# Patient Record
Sex: Male | Born: 1935
Health system: Southern US, Community
[De-identification: ages and names within clinical notes are randomized; demographics above are authoritative.]

## PROBLEM LIST (undated history)

## (undated) DIAGNOSIS — K5900 Constipation, unspecified: Secondary | ICD-10-CM

## (undated) DIAGNOSIS — G250 Essential tremor: Secondary | ICD-10-CM

## (undated) DIAGNOSIS — H906 Mixed conductive and sensorineural hearing loss, bilateral: Secondary | ICD-10-CM

## (undated) DIAGNOSIS — J449 Chronic obstructive pulmonary disease, unspecified: Secondary | ICD-10-CM

## (undated) DIAGNOSIS — E538 Deficiency of other specified B group vitamins: Secondary | ICD-10-CM

## (undated) DIAGNOSIS — M1990 Unspecified osteoarthritis, unspecified site: Secondary | ICD-10-CM

## (undated) DIAGNOSIS — E785 Hyperlipidemia, unspecified: Secondary | ICD-10-CM

## (undated) DIAGNOSIS — G309 Alzheimer's disease, unspecified: Secondary | ICD-10-CM

## (undated) DIAGNOSIS — N183 Chronic kidney disease, stage 3 unspecified: Secondary | ICD-10-CM

## (undated) DIAGNOSIS — F0280 Dementia in other diseases classified elsewhere without behavioral disturbance: Secondary | ICD-10-CM

## (undated) DIAGNOSIS — E782 Mixed hyperlipidemia: Secondary | ICD-10-CM

## (undated) DIAGNOSIS — I2510 Atherosclerotic heart disease of native coronary artery without angina pectoris: Secondary | ICD-10-CM

## (undated) DIAGNOSIS — IMO0002 Reserved for concepts with insufficient information to code with codable children: Secondary | ICD-10-CM

## (undated) DIAGNOSIS — M1710 Unilateral primary osteoarthritis, unspecified knee: Secondary | ICD-10-CM

## (undated) DIAGNOSIS — N182 Chronic kidney disease, stage 2 (mild): Secondary | ICD-10-CM

## (undated) DIAGNOSIS — I10 Essential (primary) hypertension: Secondary | ICD-10-CM

## (undated) HISTORY — DX: Atherosclerotic heart disease of native coronary artery without angina pectoris: I25.10

## (undated) HISTORY — DX: Dementia in other diseases classified elsewhere, unspecified severity, without behavioral disturbance, psychotic disturbance, mood disturbance, and anxiety: F02.80

## (undated) HISTORY — DX: Mixed hyperlipidemia: E78.2

## (undated) HISTORY — DX: Hyperlipidemia, unspecified: E78.5

## (undated) HISTORY — DX: Unspecified osteoarthritis, unspecified site: M19.90

## (undated) HISTORY — DX: Deficiency of other specified B group vitamins: E53.8

## (undated) HISTORY — DX: Unilateral primary osteoarthritis, unspecified knee: M17.10

## (undated) HISTORY — DX: Chronic kidney disease, stage 2 (mild): N18.2

## (undated) HISTORY — PX: CATARACT EXTRACTION, BILATERAL: SHX1313

## (undated) HISTORY — DX: Mixed conductive and sensorineural hearing loss, bilateral: H90.6

## (undated) HISTORY — DX: Chronic kidney disease, stage 3 (moderate): N18.3

## (undated) HISTORY — DX: Reserved for concepts with insufficient information to code with codable children: IMO0002

## (undated) HISTORY — DX: Alzheimer's disease, unspecified: G30.9

## (undated) HISTORY — DX: Chronic kidney disease, stage 3 unspecified: N18.30

## (undated) HISTORY — DX: Essential (primary) hypertension: I10

## (undated) HISTORY — DX: Essential tremor: G25.0

## (undated) HISTORY — DX: Constipation, unspecified: K59.00

## (undated) HISTORY — DX: Chronic obstructive pulmonary disease, unspecified: J44.9

---

## 1997-12-17 ENCOUNTER — Other Ambulatory Visit: Admission: RE | Admit: 1997-12-17 | Discharge: 1997-12-17 | Payer: Self-pay | Admitting: Family Medicine

## 2002-03-20 LAB — HM COLONOSCOPY

## 2005-11-01 ENCOUNTER — Ambulatory Visit: Payer: Self-pay | Admitting: Ophthalmology

## 2005-11-08 ENCOUNTER — Ambulatory Visit: Payer: Self-pay | Admitting: Ophthalmology

## 2010-02-04 ENCOUNTER — Emergency Department: Payer: Self-pay | Admitting: Unknown Physician Specialty

## 2011-04-25 IMAGING — CT CT ABD-PELV W/O CM
1 of 2 series · 15 of 32 positions shown, 19 images · non-contrast
Comparison: none

REASON FOR EXAM: (1) right abd flank pain; (2) right abd fklank pain
COMMENTS:   May transport without cardiac monitor

PROCEDURE:     CT  - CT ABDOMEN AND PELVIS W[DATE]  [DATE]
RESULT:     CT abdomen and pelvis dated 02/04/2010.
TECHNIQUE: Helical 3 mm sections were obtained from the lung bases through
the pubic symphysis without administration of intravenous contrast.

[Series 2: stone · axial · 0.66mm/px · z∈[-662,-280]mm · 15 of 144 slices shown, 19 images]
[im 11/144  soft-tissue]
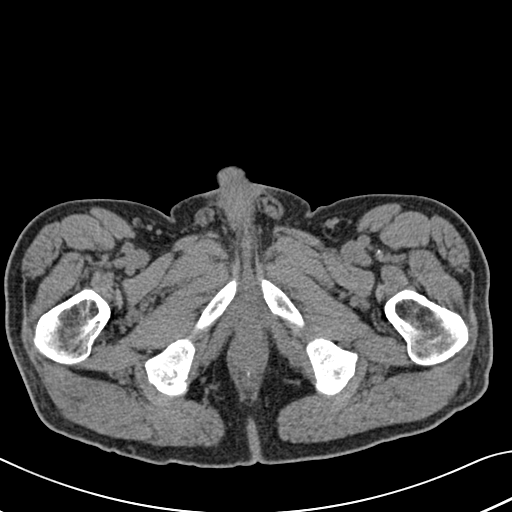
[im 11/144  bone]
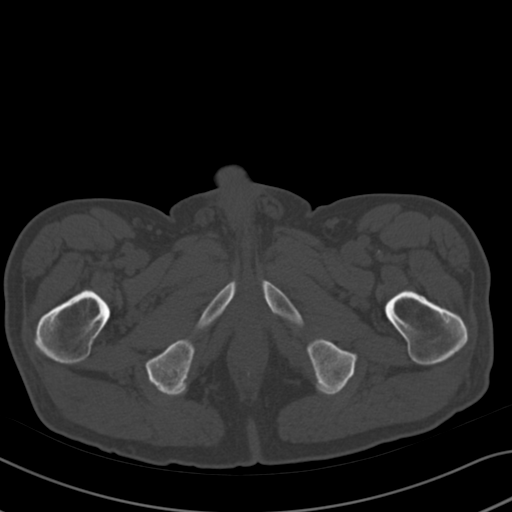
[im 22/144  soft-tissue]
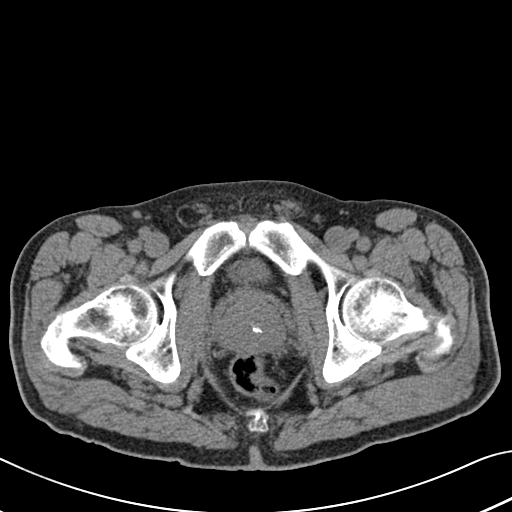
[im 32/144  soft-tissue]
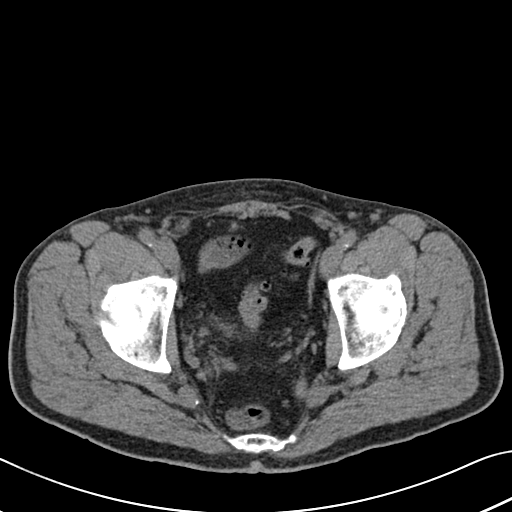
[im 43/144  soft-tissue]
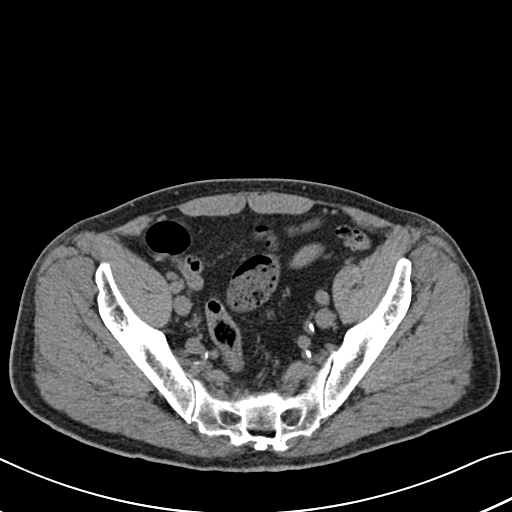
[im 53/144  soft-tissue]
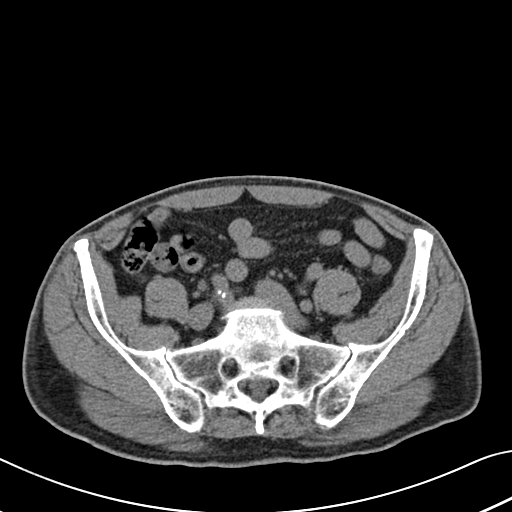
[im 64/144  soft-tissue]
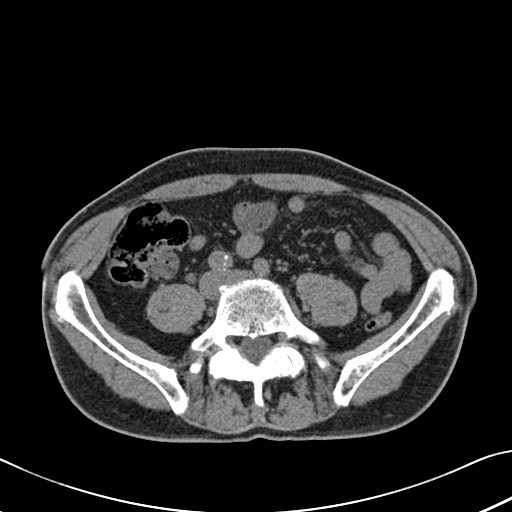
[im 75/144  soft-tissue]
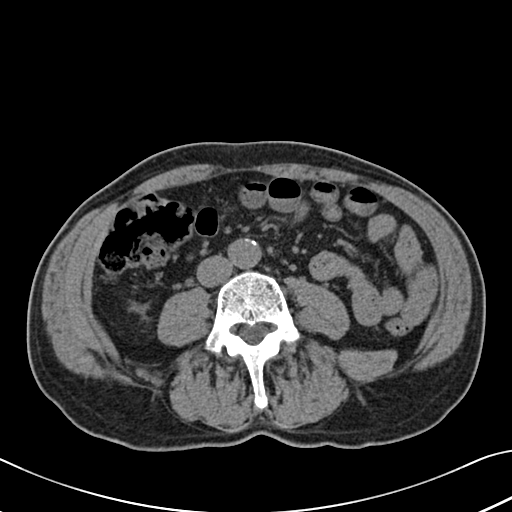
[im 85/144  soft-tissue]
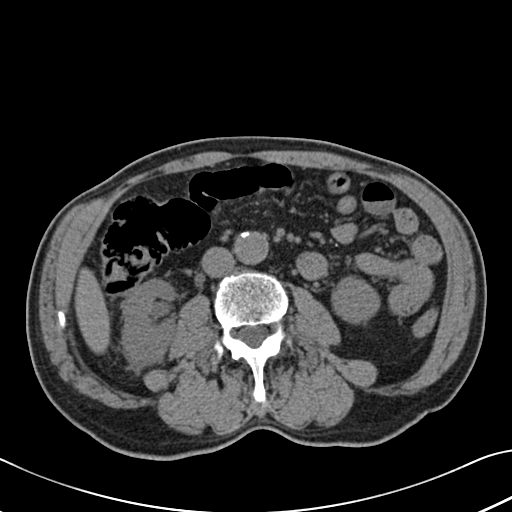
[im 96/144  soft-tissue]
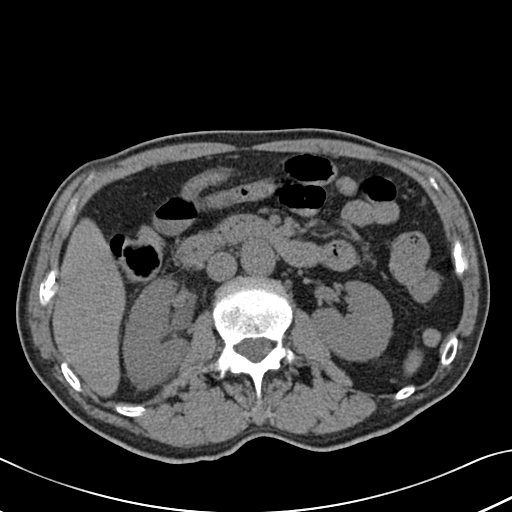
[im 96/144  bone]
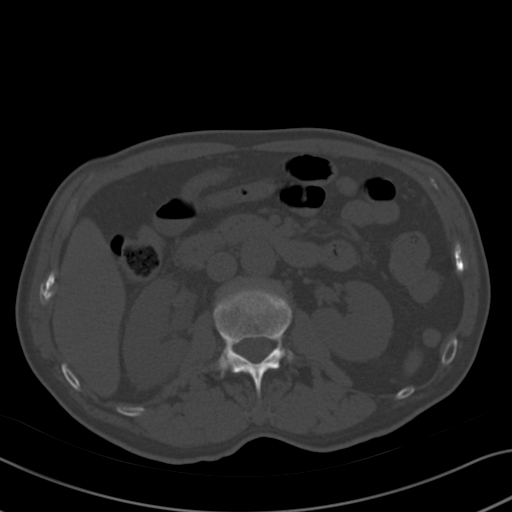
[im 106/144  soft-tissue]
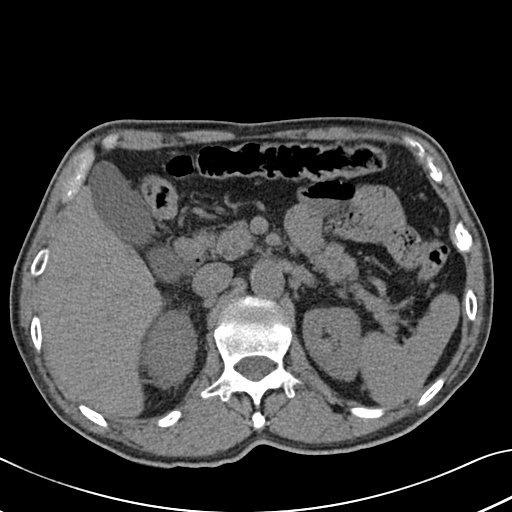
[im 117/144  soft-tissue]
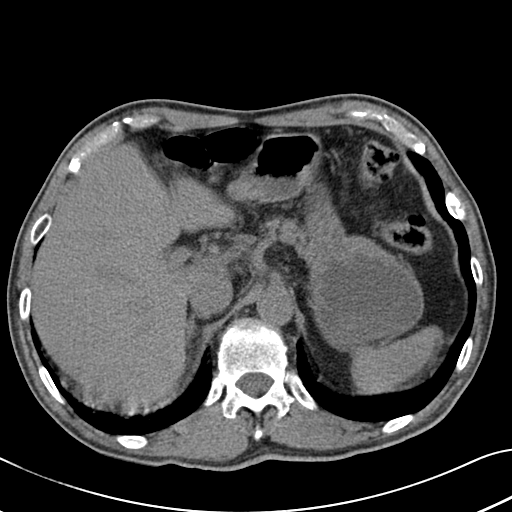
[im 122/144  lung]
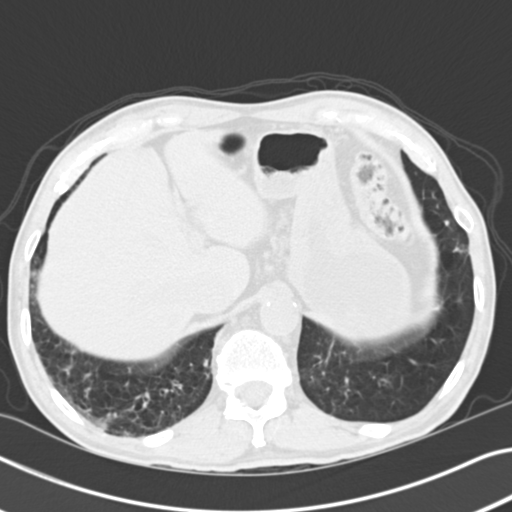
[im 128/144  soft-tissue]
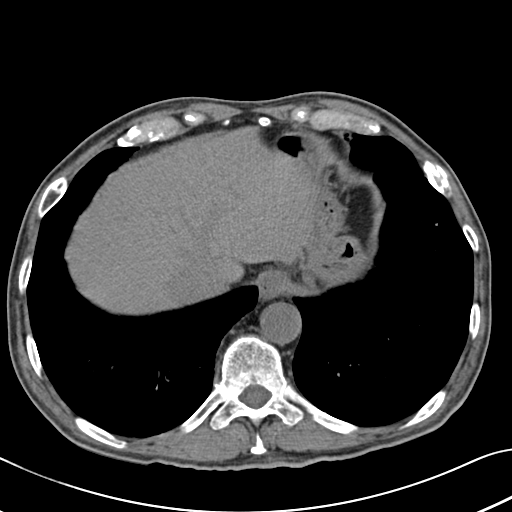
[im 128/144  lung]
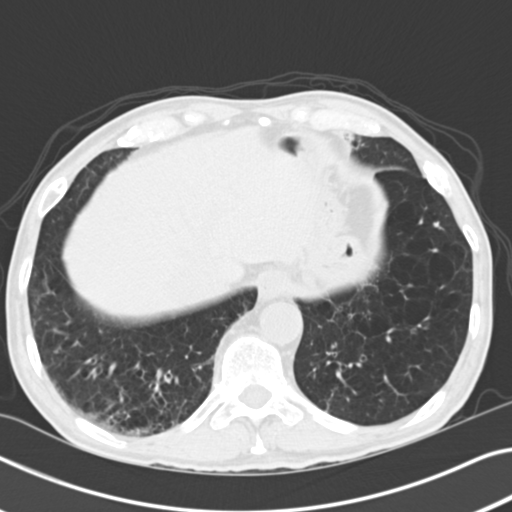
[im 133/144  lung]
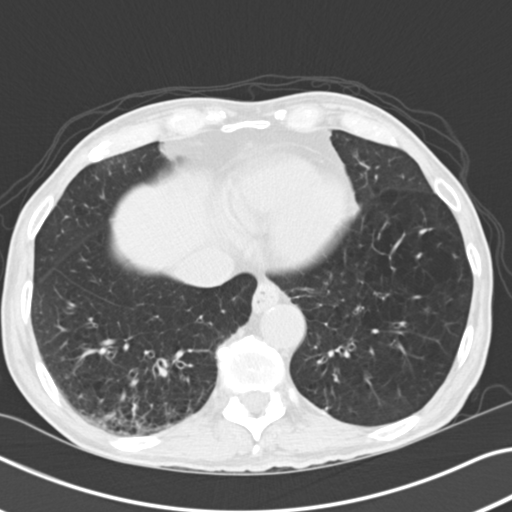
[im 138/144  soft-tissue]
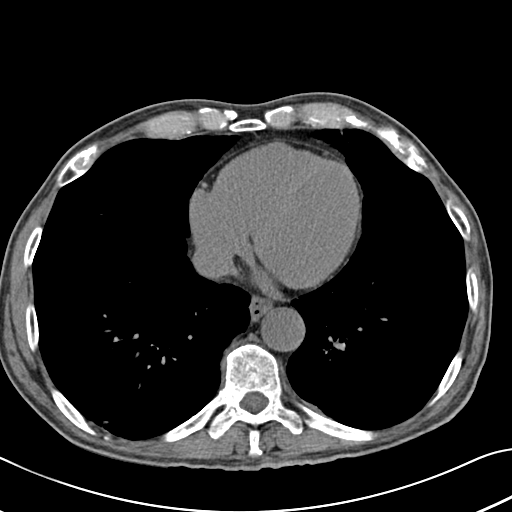
[im 138/144  lung]
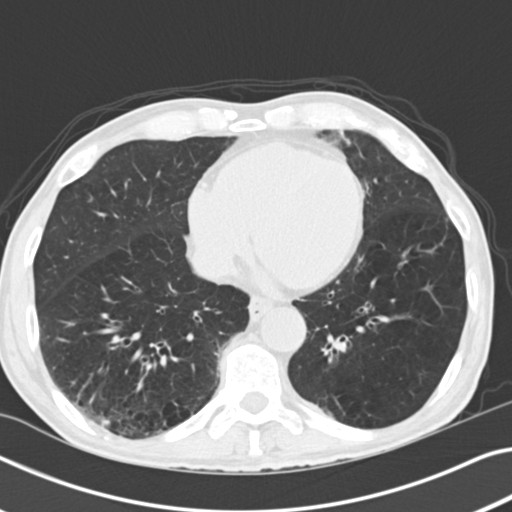

[15 of 32 positions shown; findings below may reference images not displayed]

FINDINGS: The lung bases demonstrates prominent interstitial markings like
representing fibrotic changes. There is likely hypoventilation within the
right lung base.

Evaluation of the right kidney demonstrates mild hydronephrosis as well as
mild pelviectasis. Is mild to moderate hydroureter. Within the distal ureter
at the level of the proximal ureteral vesicle junction a 2.6 mm calculus is
identified. There is no CT evidence of bowel obstruction no secondary signs
reflecting enteritis, colitis, diverticulitis, nor appendicitis within the
limitations of a noncontrasted CT. The aorta is ectatic without a definite
focal aneurysmal dilatation.

Noncontrast evaluation of the liver, spleen, adrenals, pancreas, left kidney
is unremarkable.
IMPRESSION: 1. Distal right ureteral calculus projecting at the level a proximal
ureteral vesicle junction with associated mild obstructive uropathy.
2. Fibrotic changes within the lung bases as well as likely hypoventilation
in the right lung base.

## 2011-04-25 IMAGING — CR DG CHEST 1V PORT
1 series · 1 of 1 positions shown · non-contrast
Comparison: none

REASON FOR EXAM: abd pain
COMMENTS:

PROCEDURE:     DXR - DXR PORTABLE CHEST SINGLE VIEW  - February 04, 2010  [DATE]
RESULT:     The lungs are clear. The cardiac silhouette and visualized bony
skeleton are unremarkable.

[view not recorded]
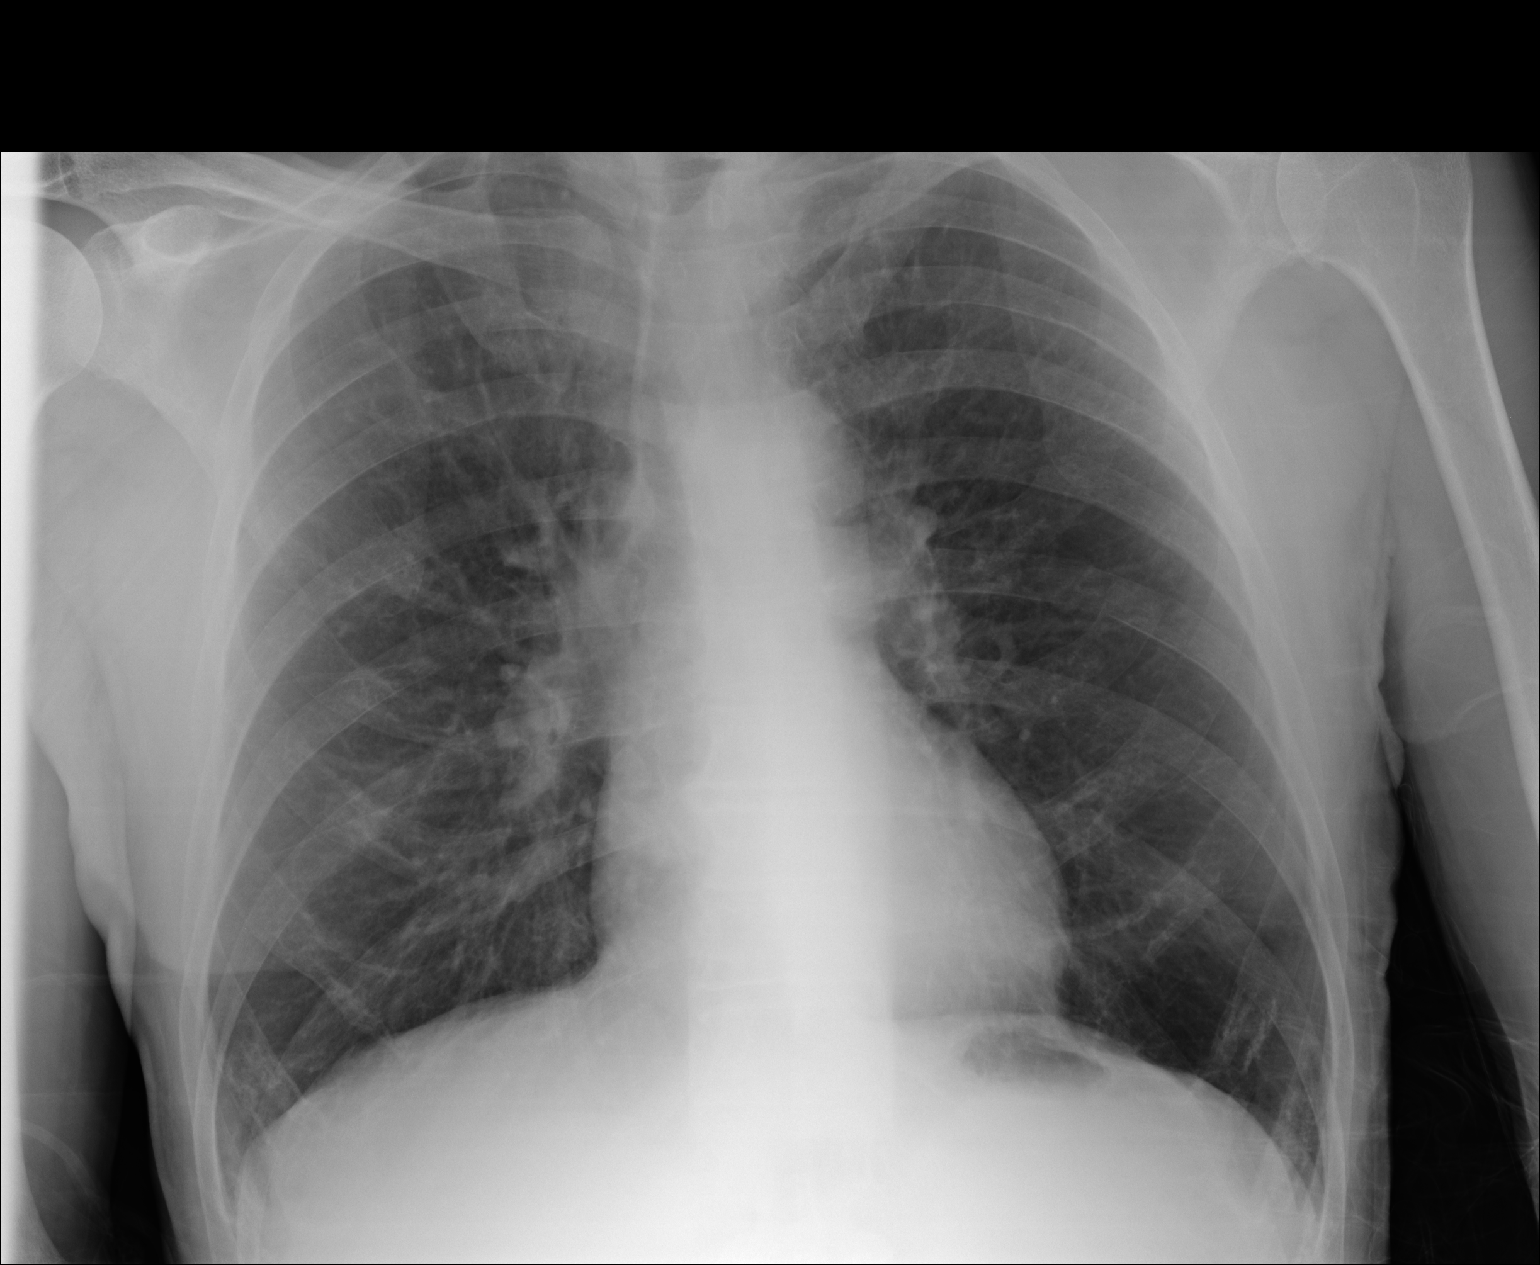

[1 of 1 positions shown; findings below may reference images not displayed]

IMPRESSION: 1. Chest radiograph without evidence of acute cardiopulmonary disease.

## 2013-10-18 ENCOUNTER — Encounter: Payer: Self-pay | Admitting: Cardiology

## 2013-10-18 ENCOUNTER — Ambulatory Visit (INDEPENDENT_AMBULATORY_CARE_PROVIDER_SITE_OTHER): Payer: Medicare Other | Admitting: Cardiology

## 2013-10-18 VITALS — BP 145/75 | HR 70 | Ht 65.0 in | Wt 136.6 lb

## 2013-10-18 DIAGNOSIS — N183 Chronic kidney disease, stage 3 unspecified: Secondary | ICD-10-CM

## 2013-10-18 DIAGNOSIS — I2510 Atherosclerotic heart disease of native coronary artery without angina pectoris: Secondary | ICD-10-CM | POA: Insufficient documentation

## 2013-10-18 DIAGNOSIS — E782 Mixed hyperlipidemia: Secondary | ICD-10-CM | POA: Insufficient documentation

## 2013-10-18 DIAGNOSIS — E785 Hyperlipidemia, unspecified: Secondary | ICD-10-CM

## 2013-10-18 DIAGNOSIS — I10 Essential (primary) hypertension: Secondary | ICD-10-CM

## 2013-10-18 NOTE — Patient Instructions (Signed)
Continue your current therapy  I will see you as needed. 

## 2013-10-18 NOTE — Progress Notes (Signed)
   Lucas Kline Date of Birth: 05-11-1929 Medical Record #[id number]  History of Present Illness: Lucas Kline is seen at the request of Dr. Henrene Pastor for evaluation of chest pain. He is a pleasant 80 yo WM with history of CAD. He apparently had cardiac caths in 1987 and 1997. He was treated medically and told there was nothing else needed to be done. He is still very active walking his dog, using a chain saw and yard work. Last week he had one brief episode of chest pain at rest. He got up and belched and his pain went away. He denies any other chest pain, SOB, palpitations, or dizziness. He is on chronic verapamil, statin, and ASA. No history of CHF.  No current outpatient prescriptions on file prior to visit.   No current facility-administered medications on file prior to visit.    Not on File  Past Medical History  Diagnosis Date  . Alzheimer disease   . CKD (chronic kidney disease), stage II     MILD  . CKD (chronic kidney disease), stage III     MODERATE (CHRONIC)  . Coronary atherosclerosis     CHRONIC  . Essential tremor     CHRONIC  . Essential hypertension, benign   . Mixed hearing loss, bilateral     CHRONIC  . Osteoarthritis     OF KNEE CHRONIC  . Other and unspecified hyperlipidemia   . Hyperlipidemia     CHRONIC  . B12 deficiency     CHRONIC  . Unspecified arthropathy, lower leg   . Unspecified constipation     CHRONIC  . COPD (chronic obstructive pulmonary disease)     TYPE 2    Past Surgical History  Procedure Laterality Date  . Cataract extraction, bilateral      History  Smoking status  . Former Smoker  Smokeless tobacco  . Not on file    History  Alcohol Use No    History reviewed. No pertinent family history.  Review of Systems: As noted in HPI.  All other systems were reviewed and are negative.  Physical Exam: BP 145/75  Pulse 70  Ht 5\' 5"  (1.651 m)  Wt 136 lb 9.6 oz (61.961 kg)  BMI 22.73 kg/m2 He is a pleasant, thin,  elderly WM in NAD.  HEENT: [place]/AT, PERRL, EOMI, sclera are clear. orophyarynx is clear.  Neck: no adenopathy, JVD, bruits, or thyromegaly. Lungs: clear. CV: RRR with occ extrasystoles. No murmur or gallop. Normal S1-2. Abdomen: soft, NT, no masses, HSM, or bruits. Ext: no edema. Pulses 2+ Skin: warm and dry Neuro: alert O x 3. CN II-XII intact. Some short term memory loss.  LABORATORY DATA: Ecg: NSR with frequents PVCs. Otherwise normal.  Assessment / Plan: 1. Atypical chest pain. Patient apparently has a history of CAD by prior cardiac cath although old records are not available. His symptoms do not suggest increased angina. He is on appropriate medical therapy. I would not recommend further cardiac evaluation unless he clearly has increasing chest pain.  2. PVCs. Asymptomatic. Continue verapamil.  3. CKD stage 2-3.  4. HTN controlled  5. Hyperlipidemia on statin  6. Alzheimer's disease.

## 2014-10-07 DIAGNOSIS — G309 Alzheimer's disease, unspecified: Secondary | ICD-10-CM | POA: Diagnosis not present

## 2014-10-07 DIAGNOSIS — Z6820 Body mass index (BMI) 20.0-20.9, adult: Secondary | ICD-10-CM | POA: Diagnosis not present

## 2014-10-07 DIAGNOSIS — N183 Chronic kidney disease, stage 3 (moderate): Secondary | ICD-10-CM | POA: Diagnosis not present

## 2014-10-07 DIAGNOSIS — I2510 Atherosclerotic heart disease of native coronary artery without angina pectoris: Secondary | ICD-10-CM | POA: Diagnosis not present

## 2014-10-07 DIAGNOSIS — E785 Hyperlipidemia, unspecified: Secondary | ICD-10-CM | POA: Diagnosis not present

## 2014-11-26 DIAGNOSIS — R197 Diarrhea, unspecified: Secondary | ICD-10-CM | POA: Diagnosis not present

## 2014-11-26 DIAGNOSIS — E860 Dehydration: Secondary | ICD-10-CM | POA: Diagnosis not present

## 2015-01-30 DIAGNOSIS — N401 Enlarged prostate with lower urinary tract symptoms: Secondary | ICD-10-CM | POA: Diagnosis not present

## 2015-01-30 DIAGNOSIS — I2510 Atherosclerotic heart disease of native coronary artery without angina pectoris: Secondary | ICD-10-CM | POA: Diagnosis not present

## 2015-01-30 DIAGNOSIS — C760 Malignant neoplasm of head, face and neck: Secondary | ICD-10-CM | POA: Diagnosis not present

## 2015-01-30 DIAGNOSIS — G309 Alzheimer's disease, unspecified: Secondary | ICD-10-CM | POA: Diagnosis not present

## 2015-01-30 DIAGNOSIS — E785 Hyperlipidemia, unspecified: Secondary | ICD-10-CM | POA: Diagnosis not present

## 2015-01-30 DIAGNOSIS — R55 Syncope and collapse: Secondary | ICD-10-CM | POA: Diagnosis not present

## 2015-06-13 DIAGNOSIS — Z0000 Encounter for general adult medical examination without abnormal findings: Secondary | ICD-10-CM | POA: Diagnosis not present

## 2015-06-13 DIAGNOSIS — N401 Enlarged prostate with lower urinary tract symptoms: Secondary | ICD-10-CM | POA: Diagnosis not present

## 2015-06-13 DIAGNOSIS — C760 Malignant neoplasm of head, face and neck: Secondary | ICD-10-CM | POA: Diagnosis not present

## 2015-06-13 DIAGNOSIS — I10 Essential (primary) hypertension: Secondary | ICD-10-CM | POA: Diagnosis not present

## 2015-06-13 DIAGNOSIS — N400 Enlarged prostate without lower urinary tract symptoms: Secondary | ICD-10-CM | POA: Diagnosis not present

## 2015-06-13 DIAGNOSIS — G250 Essential tremor: Secondary | ICD-10-CM | POA: Diagnosis not present

## 2015-06-13 DIAGNOSIS — E785 Hyperlipidemia, unspecified: Secondary | ICD-10-CM | POA: Diagnosis not present

## 2015-06-13 DIAGNOSIS — N183 Chronic kidney disease, stage 3 (moderate): Secondary | ICD-10-CM | POA: Diagnosis not present

## 2015-06-13 DIAGNOSIS — Z6820 Body mass index (BMI) 20.0-20.9, adult: Secondary | ICD-10-CM | POA: Diagnosis not present

## 2015-06-19 DIAGNOSIS — Z1211 Encounter for screening for malignant neoplasm of colon: Secondary | ICD-10-CM | POA: Diagnosis not present

## 2015-07-12 DIAGNOSIS — R0989 Other specified symptoms and signs involving the circulatory and respiratory systems: Secondary | ICD-10-CM | POA: Diagnosis not present

## 2015-07-12 DIAGNOSIS — R42 Dizziness and giddiness: Secondary | ICD-10-CM | POA: Diagnosis not present

## 2015-07-12 DIAGNOSIS — J159 Unspecified bacterial pneumonia: Secondary | ICD-10-CM | POA: Diagnosis not present

## 2015-07-12 DIAGNOSIS — E7800 Pure hypercholesterolemia, unspecified: Secondary | ICD-10-CM | POA: Diagnosis not present

## 2015-07-12 DIAGNOSIS — R05 Cough: Secondary | ICD-10-CM | POA: Diagnosis not present

## 2015-07-12 DIAGNOSIS — Z79899 Other long term (current) drug therapy: Secondary | ICD-10-CM | POA: Diagnosis not present

## 2015-07-12 DIAGNOSIS — R630 Anorexia: Secondary | ICD-10-CM | POA: Diagnosis not present

## 2015-07-12 DIAGNOSIS — R509 Fever, unspecified: Secondary | ICD-10-CM | POA: Diagnosis not present

## 2015-07-12 DIAGNOSIS — R531 Weakness: Secondary | ICD-10-CM | POA: Diagnosis not present

## 2015-07-12 DIAGNOSIS — Z681 Body mass index (BMI) 19 or less, adult: Secondary | ICD-10-CM | POA: Diagnosis not present

## 2015-07-12 DIAGNOSIS — I10 Essential (primary) hypertension: Secondary | ICD-10-CM | POA: Diagnosis not present

## 2015-07-21 DIAGNOSIS — Z681 Body mass index (BMI) 19 or less, adult: Secondary | ICD-10-CM | POA: Diagnosis not present

## 2015-07-21 DIAGNOSIS — J180 Bronchopneumonia, unspecified organism: Secondary | ICD-10-CM | POA: Diagnosis not present

## 2015-08-04 DIAGNOSIS — H906 Mixed conductive and sensorineural hearing loss, bilateral: Secondary | ICD-10-CM | POA: Diagnosis not present

## 2015-08-04 DIAGNOSIS — E538 Deficiency of other specified B group vitamins: Secondary | ICD-10-CM | POA: Diagnosis not present

## 2015-08-04 DIAGNOSIS — Z681 Body mass index (BMI) 19 or less, adult: Secondary | ICD-10-CM | POA: Diagnosis not present

## 2015-08-04 DIAGNOSIS — J180 Bronchopneumonia, unspecified organism: Secondary | ICD-10-CM | POA: Diagnosis not present

## 2015-08-04 DIAGNOSIS — Z0000 Encounter for general adult medical examination without abnormal findings: Secondary | ICD-10-CM | POA: Diagnosis not present

## 2015-08-04 DIAGNOSIS — N401 Enlarged prostate with lower urinary tract symptoms: Secondary | ICD-10-CM | POA: Diagnosis not present

## 2015-10-23 DIAGNOSIS — E538 Deficiency of other specified B group vitamins: Secondary | ICD-10-CM | POA: Diagnosis not present

## 2015-10-23 DIAGNOSIS — I10 Essential (primary) hypertension: Secondary | ICD-10-CM | POA: Diagnosis not present

## 2015-10-23 DIAGNOSIS — C760 Malignant neoplasm of head, face and neck: Secondary | ICD-10-CM | POA: Diagnosis not present

## 2015-10-23 DIAGNOSIS — E785 Hyperlipidemia, unspecified: Secondary | ICD-10-CM | POA: Diagnosis not present

## 2015-10-23 DIAGNOSIS — G309 Alzheimer's disease, unspecified: Secondary | ICD-10-CM | POA: Diagnosis not present

## 2015-11-03 DIAGNOSIS — C44311 Basal cell carcinoma of skin of nose: Secondary | ICD-10-CM | POA: Diagnosis not present

## 2015-11-19 DIAGNOSIS — C44311 Basal cell carcinoma of skin of nose: Secondary | ICD-10-CM | POA: Diagnosis not present

## 2016-01-23 DIAGNOSIS — R5383 Other fatigue: Secondary | ICD-10-CM | POA: Diagnosis not present

## 2016-01-23 DIAGNOSIS — G459 Transient cerebral ischemic attack, unspecified: Secondary | ICD-10-CM | POA: Diagnosis not present

## 2016-02-06 DIAGNOSIS — G459 Transient cerebral ischemic attack, unspecified: Secondary | ICD-10-CM | POA: Diagnosis not present

## 2016-02-06 DIAGNOSIS — H906 Mixed conductive and sensorineural hearing loss, bilateral: Secondary | ICD-10-CM | POA: Diagnosis not present

## 2016-02-06 DIAGNOSIS — C760 Malignant neoplasm of head, face and neck: Secondary | ICD-10-CM | POA: Diagnosis not present

## 2016-02-06 DIAGNOSIS — I10 Essential (primary) hypertension: Secondary | ICD-10-CM | POA: Diagnosis not present

## 2016-02-06 DIAGNOSIS — E785 Hyperlipidemia, unspecified: Secondary | ICD-10-CM | POA: Diagnosis not present

## 2016-02-26 DIAGNOSIS — R42 Dizziness and giddiness: Secondary | ICD-10-CM | POA: Diagnosis not present

## 2016-02-26 DIAGNOSIS — E785 Hyperlipidemia, unspecified: Secondary | ICD-10-CM | POA: Diagnosis not present

## 2016-02-26 DIAGNOSIS — I10 Essential (primary) hypertension: Secondary | ICD-10-CM | POA: Diagnosis not present

## 2016-03-25 DIAGNOSIS — E538 Deficiency of other specified B group vitamins: Secondary | ICD-10-CM | POA: Diagnosis not present

## 2016-04-27 DIAGNOSIS — E538 Deficiency of other specified B group vitamins: Secondary | ICD-10-CM | POA: Diagnosis not present

## 2016-06-02 DIAGNOSIS — E538 Deficiency of other specified B group vitamins: Secondary | ICD-10-CM | POA: Diagnosis not present

## 2016-07-02 DIAGNOSIS — G459 Transient cerebral ischemic attack, unspecified: Secondary | ICD-10-CM | POA: Diagnosis not present

## 2016-07-02 DIAGNOSIS — C760 Malignant neoplasm of head, face and neck: Secondary | ICD-10-CM | POA: Diagnosis not present

## 2016-07-02 DIAGNOSIS — E785 Hyperlipidemia, unspecified: Secondary | ICD-10-CM | POA: Diagnosis not present

## 2016-07-02 DIAGNOSIS — E538 Deficiency of other specified B group vitamins: Secondary | ICD-10-CM | POA: Diagnosis not present

## 2016-07-02 DIAGNOSIS — I10 Essential (primary) hypertension: Secondary | ICD-10-CM | POA: Diagnosis not present

## 2016-07-26 DIAGNOSIS — E538 Deficiency of other specified B group vitamins: Secondary | ICD-10-CM | POA: Diagnosis not present

## 2016-08-11 DIAGNOSIS — H8111 Benign paroxysmal vertigo, right ear: Secondary | ICD-10-CM | POA: Diagnosis not present

## 2017-02-25 DIAGNOSIS — E782 Mixed hyperlipidemia: Secondary | ICD-10-CM | POA: Diagnosis not present

## 2017-02-25 DIAGNOSIS — I2510 Atherosclerotic heart disease of native coronary artery without angina pectoris: Secondary | ICD-10-CM | POA: Diagnosis not present

## 2017-02-25 DIAGNOSIS — I10 Essential (primary) hypertension: Secondary | ICD-10-CM | POA: Diagnosis not present

## 2017-02-25 DIAGNOSIS — G309 Alzheimer's disease, unspecified: Secondary | ICD-10-CM | POA: Diagnosis not present

## 2017-02-25 DIAGNOSIS — E538 Deficiency of other specified B group vitamins: Secondary | ICD-10-CM | POA: Diagnosis not present

## 2017-09-27 DIAGNOSIS — E782 Mixed hyperlipidemia: Secondary | ICD-10-CM | POA: Diagnosis not present

## 2017-09-27 DIAGNOSIS — G309 Alzheimer's disease, unspecified: Secondary | ICD-10-CM | POA: Diagnosis not present

## 2017-09-27 DIAGNOSIS — D511 Vitamin B12 deficiency anemia due to selective vitamin B12 malabsorption with proteinuria: Secondary | ICD-10-CM | POA: Diagnosis not present

## 2017-09-27 DIAGNOSIS — I2510 Atherosclerotic heart disease of native coronary artery without angina pectoris: Secondary | ICD-10-CM | POA: Diagnosis not present

## 2017-09-27 DIAGNOSIS — I10 Essential (primary) hypertension: Secondary | ICD-10-CM | POA: Diagnosis not present

## 2017-10-28 DIAGNOSIS — R1084 Generalized abdominal pain: Secondary | ICD-10-CM | POA: Diagnosis not present

## 2017-10-28 DIAGNOSIS — R079 Chest pain, unspecified: Secondary | ICD-10-CM | POA: Diagnosis not present

## 2017-10-28 DIAGNOSIS — J180 Bronchopneumonia, unspecified organism: Secondary | ICD-10-CM | POA: Diagnosis not present

## 2017-10-28 DIAGNOSIS — R05 Cough: Secondary | ICD-10-CM | POA: Diagnosis not present

## 2018-01-25 DIAGNOSIS — I10 Essential (primary) hypertension: Secondary | ICD-10-CM | POA: Diagnosis not present

## 2018-01-25 DIAGNOSIS — I2510 Atherosclerotic heart disease of native coronary artery without angina pectoris: Secondary | ICD-10-CM | POA: Diagnosis not present

## 2018-01-25 DIAGNOSIS — G309 Alzheimer's disease, unspecified: Secondary | ICD-10-CM | POA: Diagnosis not present

## 2018-01-25 DIAGNOSIS — E782 Mixed hyperlipidemia: Secondary | ICD-10-CM | POA: Diagnosis not present

## 2018-01-25 DIAGNOSIS — D511 Vitamin B12 deficiency anemia due to selective vitamin B12 malabsorption with proteinuria: Secondary | ICD-10-CM | POA: Diagnosis not present

## 2018-02-14 DIAGNOSIS — R5383 Other fatigue: Secondary | ICD-10-CM | POA: Diagnosis not present

## 2018-02-14 DIAGNOSIS — A938 Other specified arthropod-borne viral fevers: Secondary | ICD-10-CM | POA: Diagnosis not present

## 2018-02-14 DIAGNOSIS — D511 Vitamin B12 deficiency anemia due to selective vitamin B12 malabsorption with proteinuria: Secondary | ICD-10-CM | POA: Diagnosis not present

## 2018-02-27 DIAGNOSIS — J441 Chronic obstructive pulmonary disease with (acute) exacerbation: Secondary | ICD-10-CM | POA: Diagnosis not present

## 2018-02-27 DIAGNOSIS — R0602 Shortness of breath: Secondary | ICD-10-CM | POA: Diagnosis not present

## 2018-03-06 DIAGNOSIS — J441 Chronic obstructive pulmonary disease with (acute) exacerbation: Secondary | ICD-10-CM | POA: Diagnosis not present

## 2018-05-16 DIAGNOSIS — I2510 Atherosclerotic heart disease of native coronary artery without angina pectoris: Secondary | ICD-10-CM | POA: Diagnosis not present

## 2018-05-16 DIAGNOSIS — I10 Essential (primary) hypertension: Secondary | ICD-10-CM | POA: Diagnosis not present

## 2018-05-16 DIAGNOSIS — Z23 Encounter for immunization: Secondary | ICD-10-CM | POA: Diagnosis not present

## 2018-05-16 DIAGNOSIS — H906 Mixed conductive and sensorineural hearing loss, bilateral: Secondary | ICD-10-CM | POA: Diagnosis not present

## 2018-05-16 DIAGNOSIS — E782 Mixed hyperlipidemia: Secondary | ICD-10-CM | POA: Diagnosis not present

## 2018-05-16 DIAGNOSIS — D511 Vitamin B12 deficiency anemia due to selective vitamin B12 malabsorption with proteinuria: Secondary | ICD-10-CM | POA: Diagnosis not present

## 2018-09-15 DIAGNOSIS — E782 Mixed hyperlipidemia: Secondary | ICD-10-CM | POA: Diagnosis not present

## 2018-09-15 DIAGNOSIS — I2510 Atherosclerotic heart disease of native coronary artery without angina pectoris: Secondary | ICD-10-CM | POA: Diagnosis not present

## 2018-09-15 DIAGNOSIS — I10 Essential (primary) hypertension: Secondary | ICD-10-CM | POA: Diagnosis not present

## 2018-09-15 DIAGNOSIS — H906 Mixed conductive and sensorineural hearing loss, bilateral: Secondary | ICD-10-CM | POA: Diagnosis not present

## 2018-09-15 DIAGNOSIS — G309 Alzheimer's disease, unspecified: Secondary | ICD-10-CM | POA: Diagnosis not present

## 2018-09-21 DIAGNOSIS — Z720 Tobacco use: Secondary | ICD-10-CM | POA: Diagnosis not present

## 2018-09-21 DIAGNOSIS — Z85828 Personal history of other malignant neoplasm of skin: Secondary | ICD-10-CM | POA: Diagnosis not present

## 2018-09-21 DIAGNOSIS — R1310 Dysphagia, unspecified: Secondary | ICD-10-CM | POA: Diagnosis not present

## 2018-09-21 DIAGNOSIS — J342 Deviated nasal septum: Secondary | ICD-10-CM | POA: Diagnosis not present

## 2018-10-02 DIAGNOSIS — R1310 Dysphagia, unspecified: Secondary | ICD-10-CM | POA: Diagnosis not present

## 2019-01-15 DIAGNOSIS — I10 Essential (primary) hypertension: Secondary | ICD-10-CM | POA: Diagnosis not present

## 2019-01-15 DIAGNOSIS — E782 Mixed hyperlipidemia: Secondary | ICD-10-CM | POA: Diagnosis not present

## 2019-01-15 DIAGNOSIS — I2510 Atherosclerotic heart disease of native coronary artery without angina pectoris: Secondary | ICD-10-CM | POA: Diagnosis not present

## 2019-01-15 DIAGNOSIS — Z8521 Personal history of malignant neoplasm of larynx: Secondary | ICD-10-CM | POA: Diagnosis not present

## 2019-01-15 DIAGNOSIS — G309 Alzheimer's disease, unspecified: Secondary | ICD-10-CM | POA: Diagnosis not present

## 2019-02-12 ENCOUNTER — Other Ambulatory Visit: Payer: Self-pay

## 2019-02-12 ENCOUNTER — Emergency Department: Payer: Medicare Other

## 2019-02-12 ENCOUNTER — Encounter: Payer: Self-pay | Admitting: Emergency Medicine

## 2019-02-12 ENCOUNTER — Emergency Department
Admission: EM | Admit: 2019-02-12 | Discharge: 2019-02-12 | Disposition: A | Discharge status: Home / Self Care | Payer: Medicare Other | Attending: Emergency Medicine | Admitting: Emergency Medicine

## 2019-02-12 DIAGNOSIS — I2510 Atherosclerotic heart disease of native coronary artery without angina pectoris: Secondary | ICD-10-CM | POA: Insufficient documentation

## 2019-02-12 DIAGNOSIS — E785 Hyperlipidemia, unspecified: Secondary | ICD-10-CM | POA: Insufficient documentation

## 2019-02-12 DIAGNOSIS — R1011 Right upper quadrant pain: Secondary | ICD-10-CM | POA: Diagnosis not present

## 2019-02-12 DIAGNOSIS — R0789 Other chest pain: Secondary | ICD-10-CM

## 2019-02-12 DIAGNOSIS — I129 Hypertensive chronic kidney disease with stage 1 through stage 4 chronic kidney disease, or unspecified chronic kidney disease: Secondary | ICD-10-CM | POA: Diagnosis not present

## 2019-02-12 DIAGNOSIS — N182 Chronic kidney disease, stage 2 (mild): Secondary | ICD-10-CM | POA: Insufficient documentation

## 2019-02-12 DIAGNOSIS — J439 Emphysema, unspecified: Secondary | ICD-10-CM | POA: Diagnosis not present

## 2019-02-12 DIAGNOSIS — R079 Chest pain, unspecified: Secondary | ICD-10-CM | POA: Diagnosis present

## 2019-02-12 DIAGNOSIS — R0602 Shortness of breath: Secondary | ICD-10-CM | POA: Diagnosis not present

## 2019-02-12 LAB — BASIC METABOLIC PANEL
Anion gap: 9 (ref 5–15)
BUN: 16 mg/dL (ref 8–23)
CO2: 24 mmol/L (ref 22–32)
Calcium: 8.4 mg/dL — ABNORMAL LOW (ref 8.9–10.3)
Chloride: 103 mmol/L (ref 98–111)
Creatinine, Ser: 1.08 mg/dL (ref 0.61–1.24)
GFR calc Af Amer: >60 mL/min (ref >60)
GFR calc non Af Amer: >60 mL/min (ref >60)
Glucose, Bld: 97 mg/dL (ref 70–99)
Potassium: 3.8 mmol/L (ref 3.5–5.1)
Sodium: 136 mmol/L (ref 135–145)

## 2019-02-12 LAB — CBC
HCT: 35.2 % — ABNORMAL LOW (ref 39.0–52.0)
Hemoglobin: 11.9 g/dL — ABNORMAL LOW (ref 13.0–17.0)
MCH: 31.2 pg (ref 26.0–34.0)
MCHC: 33.8 g/dL (ref 30.0–36.0)
MCV: 92.4 fL (ref 80.0–100.0)
Platelets: 197 10*3/uL (ref 150–400)
RBC: 3.81 MIL/uL — ABNORMAL LOW (ref 4.22–5.81)
RDW: 12.8 % (ref 11.5–15.5)
WBC: 7.6 10*3/uL (ref 4.0–10.5)
nRBC: 0 % (ref 0.0–0.2)

## 2019-02-12 LAB — HEPATIC FUNCTION PANEL
ALT: 8 U/L (ref 0–44)
AST: 12 U/L — ABNORMAL LOW (ref 15–41)
Albumin: 3.3 g/dL — ABNORMAL LOW (ref 3.5–5.0)
Alkaline Phosphatase: 56 U/L (ref 38–126)
Bilirubin, Direct: 0.1 mg/dL (ref 0.0–0.2)
Indirect Bilirubin: 0.3 mg/dL (ref 0.3–0.9)
Total Bilirubin: 0.4 mg/dL (ref 0.3–1.2)
Total Protein: 6.6 g/dL (ref 6.5–8.1)

## 2019-02-12 LAB — TROPONIN I
Troponin I: <0.03 ng/mL (ref <0.03)
Troponin I: <0.03 ng/mL (ref <0.03)

## 2019-02-12 LAB — LIPASE, BLOOD: Lipase: 35 U/L (ref 11–51)

## 2019-02-12 MED ORDER — SODIUM CHLORIDE 0.9% FLUSH: 3.0000 mL | Freq: Once | INTRAVENOUS | Status: DC

## 2019-02-12 NOTE — ED Notes (Signed)
Patient transported to X-ray 

## 2019-02-12 NOTE — Discharge Instructions (Addendum)
Return to the ER for new, worsening, or persistent chest or abdominal pain, shortness of breath, weakness or lightheadedness, or any other new or worsening symptoms that concern you.

## 2019-02-12 NOTE — ED Triage Notes (Signed)
R sided chest pain began this am. denies fall or injury.

## 2019-02-12 NOTE — ED Provider Notes (Signed)
[facility] Emergency Department Provider Note ____________________________________________   First MD Initiated Contact with Patient 02/12/19 1618     (approximate)  I have reviewed the triage vital signs and the nursing notes.   HISTORY  Chief Complaint Chest Pain    HPI Lucas Kline is a 85 y.o. male with PMH as noted who presents with right-sided chest and upper abdominal pain, acute onset when he awoke this morning, and now almost completely resolved.  He reports some shortness of breath with it but no nausea or vomiting.  He denies any history of this pain that he can remember.  He states he feels well now.  Past Medical History:  Diagnosis Date  . Alzheimer disease ([place])   . B12 deficiency    CHRONIC  . CKD (chronic kidney disease), stage II    MILD  . CKD (chronic kidney disease), stage III ([facility])    MODERATE (CHRONIC)  . COPD (chronic obstructive pulmonary disease) ([place])    TYPE 2  . Coronary atherosclerosis    CHRONIC  . Essential hypertension, benign   . Essential tremor    CHRONIC  . Hyperlipidemia    CHRONIC  . Mixed hearing loss, bilateral    CHRONIC  . Osteoarthritis    OF KNEE CHRONIC  . Other and unspecified hyperlipidemia   . Unspecified arthropathy, lower leg   . Unspecified constipation    CHRONIC    Patient Active Problem List   Diagnosis Date Noted  . CKD (chronic kidney disease), stage III ([place])   . Coronary atherosclerosis   . Essential hypertension, benign   . Hyperlipidemia     Past Surgical History:  Procedure Laterality Date  . CATARACT EXTRACTION, BILATERAL      Prior to Admission medications   Medication Sig Start Date End Date Taking? Authorizing Provider  cyanocobalamin (,VITAMIN B-12,) 1000 MCG/ML injection INJECT 1 ML WEEKLY FOR 1 MONTH 08/25/18  Yes [provider]  simvastatin (ZOCOR) 40 MG tablet Take 40 mg by mouth daily.   Yes [provider]  verapamil (VERELAN  PM) 240 MG 24 hr capsule Take 240 mg by mouth at bedtime.   Yes [provider]    Allergies Patient has no known allergies.  No family history on file.  Social History Social History   Tobacco Use  . Smoking status: Former Smoker  Substance Use Topics  . Alcohol use: No  . Drug use: No    Review of Systems  Constitutional: No fever. Eyes: No redness. ENT: No neck pain. Cardiovascular: Positive for resolved chest pain. Respiratory: Positive for resolved shortness of breath. Gastrointestinal: No vomiting or diarrhea.  Genitourinary: Negative for flank pain.  Musculoskeletal: Negative for back pain. Skin: Negative for rash. Neurological: Negative for headache.   ____________________________________________   PHYSICAL EXAM:  VITAL SIGNS: ED Triage Vitals  Enc Vitals Group     BP 02/12/19 1504 130/61     Pulse Rate 02/12/19 1504 78     Resp 02/12/19 1504 20     Temp 02/12/19 1504 97.6 F (36.4 C)     Temp Source 02/12/19 1504 Oral     SpO2 02/12/19 1504 97 %     Weight 02/12/19 1505 120 lb (54.4 kg)     Height 02/12/19 1505 5\' 7"  (1.702 m)     Head Circumference --      Peak Flow --      Pain Score 02/12/19 1504 2     Pain  Loc --      Pain Edu? --      Excl. in [place]? --     Constitutional: Alert and oriented.  Frail but relatively well appearing for age, in no acute distress. Eyes: Conjunctivae are normal.  No scleral icterus. Head: Atraumatic. Nose: No congestion/rhinnorhea. Mouth/Throat: Mucous membranes are moist.   Neck: Normal range of motion.  Cardiovascular: Normal rate, regular rhythm. Grossly normal heart sounds.  Good peripheral circulation. Respiratory: Normal respiratory effort.  No retractions. Lungs CTAB. Gastrointestinal: Soft with mild right upper quadrant discomfort. No distention.  Genitourinary: No flank tenderness. Musculoskeletal: Extremities warm and well perfused.  Neurologic:  Normal speech and language. No gross focal  neurologic deficits are appreciated.  Skin:  Skin is warm and dry. No rash noted. Psychiatric: Mood and affect are normal. Speech and behavior are normal.  ____________________________________________   LABS (all labs ordered are listed, but only abnormal results are displayed)  Labs Reviewed  BASIC METABOLIC PANEL - Abnormal; Notable for the following components:      Result Value   Calcium 8.4 (*)    All other components within normal limits  CBC - Abnormal; Notable for the following components:   RBC 3.81 (*)    Hemoglobin 11.9 (*)    HCT 35.2 (*)    All other components within normal limits  HEPATIC FUNCTION PANEL - Abnormal; Notable for the following components:   Albumin 3.3 (*)    AST 12 (*)    All other components within normal limits  TROPONIN I  LIPASE, BLOOD  TROPONIN I   ____________________________________________  EKG  ED ECG REPORT I, Arta Silence, the attending physician, personally viewed and interpreted this ECG.  Date: 02/12/2019 EKG Time: 1503 Rate: 77 Rhythm: normal sinus rhythm QRS Axis: normal Intervals: normal ST/T Wave abnormalities: normal Narrative Interpretation: no evidence of acute ischemia  ____________________________________________  RADIOLOGY  CXR: Hyperinflation with no other acute abnormality  ____________________________________________   PROCEDURES  Procedure(s) performed: No  Procedures  Critical Care performed: No ____________________________________________   INITIAL IMPRESSION / ASSESSMENT AND PLAN / ED COURSE  Pertinent labs & imaging results that were available during my care of the patient were reviewed by me and considered in my medical decision making (see chart for details).  85 year old male with PMH as noted above presents with atypical right-sided chest and upper abdominal pain which he awoke with this morning in which is now resolved.  He reports some shortness of breath with it but no other  acute symptoms.  I reviewed the past medical records in [place].  The patient has no recent prior ED visits or admissions here within the last several years.  He has no known prior cardiac history and no surgeries on his abdomen.  On exam he is slightly frail but relatively well-appearing overall for his age.  Vital signs are normal.  The remainder of the exam is unremarkable except he does have mild discomfort in the right upper quadrant.  Overall I suspect most likely musculoskeletal chest and abdominal wall pain, however given the location of the pain and the fact that it resolved, differential also includes biliary colic.  I have a low suspicion for ACS given the nature of the pain, the fact it is not exertional, and the lack of EKG abnormalities.  Initial labs and troponin obtained from triage are within normal limits.  I will add on hepatobiliary labs and a right upper quadrant ultrasound.  I anticipate discharge home if these are  negative.  ----------------------------------------- 8:10 PM on 02/12/2019 -----------------------------------------  Hepatobiliary labs and right upper quadrant ultrasound are within normal limits.  Second troponin was also negative.  The patient has remained pain-free in the ED and wants to go home.  He is stable for discharge at this time.  Return precautions given, and he expresses understanding.  ___________________________________________  [name] was evaluated in Emergency Department on 02/12/2019 for the symptoms described in the history of present illness. He was evaluated in the context of the global COVID-19 pandemic, which necessitated consideration that the patient might be at risk for infection with the SARS-CoV-2 virus that causes COVID-19. Institutional protocols and algorithms that pertain to the evaluation of patients at risk for COVID-19 are in a state of rapid change based on information released by regulatory bodies including the CDC and  federal and state organizations. These policies and algorithms were followed during the patient's care in the ED.   ____________________________________________   FINAL CLINICAL IMPRESSION(S) / ED DIAGNOSES  Final diagnoses:  Right upper quadrant abdominal pain  Atypical chest pain      NEW MEDICATIONS STARTED DURING THIS VISIT:  New Prescriptions   No medications on file     Note:  This document was prepared using Dragon voice recognition software and may include unintentional dictation errors.    Arta Silence, MD 02/12/19 2010

## 2019-02-16 DIAGNOSIS — D6489 Other specified anemias: Secondary | ICD-10-CM | POA: Diagnosis not present

## 2019-02-16 DIAGNOSIS — R10811 Right upper quadrant abdominal tenderness: Secondary | ICD-10-CM | POA: Diagnosis not present

## 2019-02-16 DIAGNOSIS — Z1159 Encounter for screening for other viral diseases: Secondary | ICD-10-CM | POA: Diagnosis not present

## 2019-02-16 DIAGNOSIS — E440 Moderate protein-calorie malnutrition: Secondary | ICD-10-CM | POA: Diagnosis not present

## 2019-02-23 DIAGNOSIS — Z1211 Encounter for screening for malignant neoplasm of colon: Secondary | ICD-10-CM | POA: Diagnosis not present

## 2019-03-14 DIAGNOSIS — E440 Moderate protein-calorie malnutrition: Secondary | ICD-10-CM | POA: Diagnosis not present

## 2019-03-14 DIAGNOSIS — Z0000 Encounter for general adult medical examination without abnormal findings: Secondary | ICD-10-CM | POA: Diagnosis not present

## 2019-03-14 DIAGNOSIS — Z1159 Encounter for screening for other viral diseases: Secondary | ICD-10-CM | POA: Diagnosis not present

## 2019-05-22 DIAGNOSIS — G309 Alzheimer's disease, unspecified: Secondary | ICD-10-CM | POA: Diagnosis not present

## 2019-05-22 DIAGNOSIS — I10 Essential (primary) hypertension: Secondary | ICD-10-CM | POA: Diagnosis not present

## 2019-05-22 DIAGNOSIS — I2510 Atherosclerotic heart disease of native coronary artery without angina pectoris: Secondary | ICD-10-CM | POA: Diagnosis not present

## 2019-05-22 DIAGNOSIS — N183 Chronic kidney disease, stage 3 (moderate): Secondary | ICD-10-CM | POA: Diagnosis not present

## 2019-05-22 DIAGNOSIS — E782 Mixed hyperlipidemia: Secondary | ICD-10-CM | POA: Diagnosis not present

## 2019-09-24 DIAGNOSIS — I2510 Atherosclerotic heart disease of native coronary artery without angina pectoris: Secondary | ICD-10-CM | POA: Diagnosis not present

## 2019-09-24 DIAGNOSIS — G309 Alzheimer's disease, unspecified: Secondary | ICD-10-CM | POA: Diagnosis not present

## 2019-09-24 DIAGNOSIS — Z8521 Personal history of malignant neoplasm of larynx: Secondary | ICD-10-CM | POA: Diagnosis not present

## 2019-09-24 DIAGNOSIS — I10 Essential (primary) hypertension: Secondary | ICD-10-CM | POA: Diagnosis not present

## 2019-09-24 DIAGNOSIS — E782 Mixed hyperlipidemia: Secondary | ICD-10-CM | POA: Diagnosis not present

## 2020-01-05 ENCOUNTER — Other Ambulatory Visit: Payer: Self-pay | Admitting: Legal Medicine

## 2020-01-18 ENCOUNTER — Encounter: Payer: Self-pay | Admitting: Legal Medicine

## 2020-01-18 DIAGNOSIS — M1990 Unspecified osteoarthritis, unspecified site: Secondary | ICD-10-CM | POA: Insufficient documentation

## 2020-01-18 DIAGNOSIS — K5900 Constipation, unspecified: Secondary | ICD-10-CM | POA: Insufficient documentation

## 2020-01-18 DIAGNOSIS — Z8521 Personal history of malignant neoplasm of larynx: Secondary | ICD-10-CM | POA: Insufficient documentation

## 2020-01-18 DIAGNOSIS — F0280 Dementia in other diseases classified elsewhere without behavioral disturbance: Secondary | ICD-10-CM | POA: Insufficient documentation

## 2020-01-18 HISTORY — DX: Personal history of malignant neoplasm of larynx: Z85.21

## 2020-01-21 ENCOUNTER — Encounter: Payer: Self-pay | Admitting: Legal Medicine

## 2020-01-21 ENCOUNTER — Ambulatory Visit (INDEPENDENT_AMBULATORY_CARE_PROVIDER_SITE_OTHER): Payer: Medicare Other | Admitting: Legal Medicine

## 2020-01-21 ENCOUNTER — Other Ambulatory Visit: Payer: Self-pay

## 2020-01-21 VITALS — BP 100/62 | HR 78 | Temp 97.5°F | Resp 17 | Ht 65.75 in | Wt 117.2 lb

## 2020-01-21 DIAGNOSIS — H906 Mixed conductive and sensorineural hearing loss, bilateral: Secondary | ICD-10-CM

## 2020-01-21 DIAGNOSIS — I482 Chronic atrial fibrillation, unspecified: Secondary | ICD-10-CM

## 2020-01-21 DIAGNOSIS — F0280 Dementia in other diseases classified elsewhere without behavioral disturbance: Secondary | ICD-10-CM

## 2020-01-21 DIAGNOSIS — Z681 Body mass index (BMI) 19 or less, adult: Secondary | ICD-10-CM

## 2020-01-21 DIAGNOSIS — J449 Chronic obstructive pulmonary disease, unspecified: Secondary | ICD-10-CM | POA: Insufficient documentation

## 2020-01-21 DIAGNOSIS — I2510 Atherosclerotic heart disease of native coronary artery without angina pectoris: Secondary | ICD-10-CM | POA: Diagnosis not present

## 2020-01-21 DIAGNOSIS — J4489 Other specified chronic obstructive pulmonary disease: Secondary | ICD-10-CM

## 2020-01-21 DIAGNOSIS — E782 Mixed hyperlipidemia: Secondary | ICD-10-CM | POA: Diagnosis not present

## 2020-01-21 DIAGNOSIS — E440 Moderate protein-calorie malnutrition: Secondary | ICD-10-CM

## 2020-01-21 DIAGNOSIS — R2681 Unsteadiness on feet: Secondary | ICD-10-CM

## 2020-01-21 DIAGNOSIS — I10 Essential (primary) hypertension: Secondary | ICD-10-CM

## 2020-01-21 DIAGNOSIS — G309 Alzheimer's disease, unspecified: Secondary | ICD-10-CM

## 2020-01-21 HISTORY — DX: Chronic obstructive pulmonary disease, unspecified: J44.9

## 2020-01-21 HISTORY — DX: Chronic atrial fibrillation, unspecified: I48.20

## 2020-01-21 HISTORY — DX: Unsteadiness on feet: R26.81

## 2020-01-21 HISTORY — DX: Body mass index (BMI) 19.9 or less, adult: Z68.1

## 2020-01-21 HISTORY — DX: Other specified chronic obstructive pulmonary disease: J44.89

## 2020-01-21 HISTORY — DX: Moderate protein-calorie malnutrition: E44.0

## 2020-01-21 LAB — PULMONARY FUNCTION TEST
FEV1/FVC: 67.4 %
FEV1: 0.04 L
FVC: 0.1 L

## 2020-01-21 MED ORDER — CYANOCOBALAMIN 1000 MCG/ML IJ SOLN: 1000.0000 ug | INTRAMUSCULAR | 2 refills | Status: DC

## 2020-01-21 MED ORDER — VERAPAMIL HCL ER 240 MG PO CP24: 240.0000 mg | ORAL_CAPSULE | Freq: Every day | ORAL | 2 refills | Status: DC

## 2020-01-21 MED ORDER — SIMVASTATIN 40 MG PO TABS: 40.0000 mg | ORAL_TABLET | Freq: Every day | ORAL | 2 refills | Status: DC

## 2020-01-21 NOTE — Assessment & Plan Note (Signed)
Patient is having chronic unstable gait.  We discussed PT, he says nurse will be coming soon.

## 2020-01-21 NOTE — Assessment & Plan Note (Signed)
An individual plan was formulated based on patient history and exam, labs and evidence based data. Patient has no had recent angina or nitroglycerin use. continue present treatment.

## 2020-01-21 NOTE — Assessment & Plan Note (Signed)
Patient's dementia is stable MMSE remains 17/30.  He remains independent withpout supervision for medicines.  An individual plan was formulated for this patient based on patient history and physical exam, x-rays an other tests.  Evidence based decisions made.

## 2020-01-21 NOTE — Progress Notes (Signed)
Established Patient Office Visit  Subjective:  Patient ID: Lucas Kline, male    DOB: October 25, 1928  Age: 86 y.o. MRN: [id number]  CC:  Chief Complaint  Patient presents with  . Hyperlipidemia  . Hypertension    HPI Lucas Kline presents for chronic visit. He lives with som who works in [place].  Poor historian. Patient presents for follow up of hypertension.  Patient tolerating verapamil well with side effects.  Patient was diagnosed with hypertension 2010 so has been treated for hypertension for 10 years.Patient is working on maintaining diet and exercise regimen and follows up as directed. Complication include cad.  Patient presents with hyperlipidemia.  Compliance with treatment has been good; patient takes medicines as directed, maintains low cholesterol diet, follows up as directed, and maintains exercise regimen.  Patient is using simvastatin without problems.  CORONARY ARTERY DISEASE  Patient presents in follow up of CAD. Patient was diagnosed in 2years. The patient has no associated CHF. The patient is currently taking a beta blocker, statin, and aspirin. CAD was diagnosed 2 years ago.  Patient is having no angina. Patient has used no NTG.  Patient is followed by cardiology.  Patient had no stents . Last angiography was na, last echocardiogram unknown.  Past Medical History:  Diagnosis Date  . Alzheimer disease ([place])   . B12 deficiency    CHRONIC  . CKD (chronic kidney disease), stage II    MILD  . CKD (chronic kidney disease), stage III    MODERATE (CHRONIC)  . Coronary atherosclerosis    CHRONIC  . Essential hypertension, benign   . Essential tremor    CHRONIC  . Mixed hearing loss, bilateral    CHRONIC  . Mixed hyperlipidemia    CHRONIC   . Osteoarthritis    OF KNEE CHRONIC  . Personal history of malignant neoplasm of larynx 01/18/2020  . Unspecified arthropathy, lower leg   . Unspecified constipation    CHRONIC   Depression screen [facility] 2/9  01/21/2020 01/21/2020  Decreased Interest 0 0  Down, Depressed, Hopeless 0 0  PHQ - 2 Score 0 0   The patient does not have a history of falls. I did complete a risk assessment for falls. A plan of care for falls was documented.  I still am concerned for falls Past Surgical History:  Procedure Laterality Date  . CATARACT EXTRACTION, BILATERAL      History reviewed. No pertinent family history.  Social History   Socioeconomic History  . Marital status: Widowed    Spouse name: Not on file  . Number of children: 2  . Years of education: Not on file  . Highest education level: Not on file  Occupational History  . Occupation: hosiery mill  . Occupation: Copywriter, advertising  Tobacco Use  . Smoking status: Former Smoker    Types: Cigarettes    Quit date: 2008    Years since quitting: 13.3  . Smokeless tobacco: Current User    Types: Chew  Substance and Sexual Activity  . Alcohol use: No  . Drug use: No  . Sexual activity: Not Currently  Other Topics Concern  . Not on file  Social History Narrative  . Not on file   Social Determinants of Health   Financial Resource Strain:   . Difficulty of Paying Living Expenses:   Food Insecurity:   . Worried About Charity fundraiser in the Last Year:   . [place] in the Last Year:  Transportation Needs:   . Film/video editor (Medical):   Marland Kitchen Lack of Transportation (Non-Medical):   Physical Activity:   . Days of Exercise per Week:   . Minutes of Exercise per Session:   Stress:   . Feeling of Stress :   Social Connections:   . Frequency of Communication with Friends and Family:   . Frequency of Social Gatherings with Friends and Family:   . Attends Religious Services:   . Active Member of Clubs or Organizations:   . Attends Archivist Meetings:   Marland Kitchen Marital Status:   Intimate Partner Violence:   . Fear of Current or Ex-Partner:   . Emotionally Abused:   Marland Kitchen Physically Abused:   . Sexually Abused:      Outpatient Medications Prior to Visit  Medication Sig Dispense Refill  . cyanocobalamin (,VITAMIN B-12,) 1000 MCG/ML injection INJECT 1 ML WEEKLY FOR 1 MONTH    . simvastatin (ZOCOR) 40 MG tablet TAKE 1 TABLET BY MOUTH ONCE DAILY FOR 30 DAYS 90 tablet 2  . verapamil (VERELAN PM) 240 MG 24 hr capsule Take 240 mg by mouth at bedtime.     No facility-administered medications prior to visit.    Allergies  Allergen Reactions  . Aricept [Donepezil] Other (See Comments)    Abdominal pain, diarrhea    ROS Review of Systems  Constitutional: Negative.   HENT: Negative.   Eyes: Negative.   Respiratory: Negative.   Cardiovascular: Negative.   Gastrointestinal: Negative.   Endocrine: Negative.   Genitourinary: Negative.   Musculoskeletal: Negative.   Skin: Negative.   Neurological:       Unstable gait  Psychiatric/Behavioral: Negative.       Objective:    Physical Exam  Constitutional: He is oriented to person, place, and time. He appears well-developed and well-nourished.  HENT:  Head: Normocephalic and atraumatic.  Right Ear: External ear normal.  Left Ear: External ear normal.  Nose: Nose normal.  Mouth/Throat: Oropharynx is clear and moist.  glasses  Eyes: Pupils are equal, round, and reactive to light. Conjunctivae and EOM are normal.  Cardiovascular: Normal rate, regular rhythm, normal heart sounds and intact distal pulses.  Pulmonary/Chest: Effort normal and breath sounds normal.  Abdominal: Soft. Bowel sounds are normal.  Musculoskeletal:        General: Normal range of motion.     Cervical back: Neck supple.     Comments: Loss of muscle mass  Neurological: He is alert and oriented to person, place, and time. He has normal reflexes.  Unstable rhomberg  Skin: Skin is warm and dry.  Psychiatric: He has a normal mood and affect. His behavior is normal.  Vitals reviewed. EKG: atrial fibrillation rate 57, QTC 445, Axis 82, no st-T changes. PFT: FVC 108%, FEV1  57%. FEV1/FVC 51% PEF 19%  BP 100/62 (BP Location: Right Arm, Patient Position: Sitting)   Pulse 78   Temp (!) 97.5 F (36.4 C) (Temporal)   Resp 17   Ht 5' 5.75" (1.67 m)   Wt 117 lb 3.2 oz (53.2 kg)   SpO2 95%   BMI 19.06 kg/m  Wt Readings from Last 3 Encounters:  01/21/20 117 lb 3.2 oz (53.2 kg)  02/12/19 120 lb (54.4 kg)  10/18/13 136 lb 9.6 oz (62 kg)     Health Maintenance Due  Topic Date Due  . COVID-19 Vaccine (1) Never done  . PNA vac Low Risk Adult (2 of 2 - PPSV23) 01/18/2015    There are  no preventive care reminders to display for this patient.  No results found for: TSH Lab Results  Component Value Date   WBC 7.6 02/12/2019   HGB 11.9 (L) 02/12/2019   HCT 35.2 (L) 02/12/2019   MCV 92.4 02/12/2019   PLT 197 02/12/2019   Lab Results  Component Value Date   NA 136 02/12/2019   K 3.8 02/12/2019   CO2 24 02/12/2019   GLUCOSE 97 02/12/2019   BUN 16 02/12/2019   CREATININE 1.08 02/12/2019   BILITOT 0.4 02/12/2019   ALKPHOS 56 02/12/2019   AST 12 (L) 02/12/2019   ALT 8 02/12/2019   PROT 6.6 02/12/2019   ALBUMIN 3.3 (L) 02/12/2019   CALCIUM 8.4 (L) 02/12/2019   ANIONGAP 9 02/12/2019   No results found for: CHOL No results found for: HDL No results found for: LDLCALC No results found for: TRIG No results found for: CHOLHDL No results found for: HGBA1C    Assessment & Plan:   Problem List Items Addressed This Visit      Cardiovascular and Mediastinum   Atherosclerotic heart disease of native coronary artery without angina pectoris    An individual plan was formulated based on patient history and exam, labs and evidence based data. Patient has no had recent angina or nitroglycerin use. continue present treatment.      Relevant Medications   verapamil (VERELAN PM) 240 MG 24 hr capsule   simvastatin (ZOCOR) 40 MG tablet   Other Relevant Orders   TSH   Essential hypertension, benign - Primary    An individual hypertension care plan was  established and reinforced today.  The patient's status was assessed using clinical findings on exam and labs or diagnostic tests. The patient's success at meeting treatment goals on disease specific evidence-based guidelines and found to be well controlled. SELF MANAGEMENT: The patient and I together assessed ways to personally work towards obtaining the recommended goals. RECOMMENDATIONS: avoid decongestants found in common cold remedies, decrease consumption of alcohol, perform routine monitoring of BP with home BP cuff, exercise, reduction of dietary salt, take medicines as prescribed, try not to miss doses and quit smoking.  Regular exercise and maintaining a healthy weight is needed.  Stress reduction may help. A CLINICAL SUMMARY including written plan identify barriers to care unique to individual due to social or financial issues.  We attempt to mutually creat solutions for individual and family understanding.      Relevant Medications   verapamil (VERELAN PM) 240 MG 24 hr capsule   simvastatin (ZOCOR) 40 MG tablet   Other Relevant Orders   CBC with Differential/Platelet   Comprehensive metabolic panel   Chronic atrial fibrillation (HCC)    AN INDIVIDUAL CARE PLANfor atrial fibrillation  was established and reinforced today.  The patient's status was assessed using clinical findings on exam, labs, and other diagnostic testing. Patient's success at meeting treatment goals based on disease specific evidence-bassed guidelines and found to be in good control. RECOMMENDATIONS include maintaining present medicines and treatment.      Relevant Medications   verapamil (VERELAN PM) 240 MG 24 hr capsule   simvastatin (ZOCOR) 40 MG tablet   Other Relevant Orders   Ambulatory referral to Cardiology   EKG 12-Lead (Completed)     Respiratory   Obstructive chronic bronchitis without exacerbation ([place])    An individualize plan was formulated for care of COPD.  Treatment is evidence based.  She will  continue on inhalers, avoid smoking and smoke.  Regular exercise  with help with dyspnea. Routine follow ups and medication compliance is needed.      Relevant Orders   Pulmonary Function Test (Completed)     Nervous and Auditory   Alzheimer disease ([place])    Patient's dementia is stable MMSE remains 17/30.  He remains independent withpout supervision for medicines.  An individual plan was formulated for this patient based on patient history and physical exam, x-rays an other tests.  Evidence based decisions made.        Other   Mixed hyperlipidemia   Relevant Medications   verapamil (VERELAN PM) 240 MG 24 hr capsule   simvastatin (ZOCOR) 40 MG tablet   Other Relevant Orders   Lipid panel   Unstable gait    Patient is having chronic unstable gait.  We discussed PT, he says nurse will be coming soon.      Adult BMI <19 kg/sq m    Patient has stable weight but has muscle weakness.      Malnutrition of moderate degree (HCC)    Supplement nutrition with protein/calorie supplement with meals to improve nutritional status.       Other Visit Diagnoses    Moderate protein-calorie malnutrition ([place])       Mixed conductive and sensorineural hearing loss of both ears          Meds ordered this encounter  Medications  . verapamil (VERELAN PM) 240 MG 24 hr capsule    Sig: Take 1 capsule (240 mg total) by mouth at bedtime.    Dispense:  90 capsule    Refill:  2  . simvastatin (ZOCOR) 40 MG tablet    Sig: Take 1 tablet (40 mg total) by mouth daily.    Dispense:  90 tablet    Refill:  2    Follow-up: Return in about 3 months (around 04/22/2020) for fasting.    Reinaldo Meeker, MD

## 2020-01-21 NOTE — Assessment & Plan Note (Signed)
Supplement nutrition with protein/calorie supplement with meals to improve nutritional status. 

## 2020-01-21 NOTE — Assessment & Plan Note (Signed)
Patient has stable weight but has muscle weakness.

## 2020-01-21 NOTE — Assessment & Plan Note (Signed)

## 2020-01-21 NOTE — Assessment & Plan Note (Signed)
An individualize plan was formulated for care of COPD.  Treatment is evidence based.  She will continue on inhalers, avoid smoking and smoke.  Regular exercise with help with dyspnea. Routine follow ups and medication compliance is needed. 

## 2020-01-21 NOTE — Assessment & Plan Note (Signed)
AN INDIVIDUAL CARE PLAN for atrial fibrillation was established and reinforced today.  The patient's status was assessed using clinical findings on exam, labs, and other diagnostic testing. Patient's success at meeting treatment goals based on disease specific evidence-bassed guidelines and found to be in good control. RECOMMENDATIONS include maintaining present medicines and treatment. 

## 2020-01-22 ENCOUNTER — Other Ambulatory Visit: Payer: Self-pay | Admitting: Legal Medicine

## 2020-01-22 DIAGNOSIS — E039 Hypothyroidism, unspecified: Secondary | ICD-10-CM

## 2020-01-22 HISTORY — DX: Hypothyroidism, unspecified: E03.9

## 2020-01-22 LAB — LIPID PANEL
Chol/HDL Ratio: 2.4 ratio (ref 0.0–5.0)
Cholesterol, Total: 148 mg/dL (ref 100–199)
HDL: 61 mg/dL (ref >39)
LDL Chol Calc (NIH): 73 mg/dL (ref 0–99)
Triglycerides: 72 mg/dL (ref 0–149)
VLDL Cholesterol Cal: 14 mg/dL (ref 5–40)

## 2020-01-22 LAB — CBC WITH DIFFERENTIAL/PLATELET
Basophils Absolute: 0 10*3/uL (ref 0.0–0.2)
Basos: 1 %
EOS (ABSOLUTE): 0.2 10*3/uL (ref 0.0–0.4)
Eos: 4 %
Hematocrit: 37.5 % (ref 37.5–51.0)
Hemoglobin: 13.2 g/dL (ref 13.0–17.7)
Immature Grans (Abs): 0 10*3/uL (ref 0.0–0.1)
Immature Granulocytes: 0 %
Lymphocytes Absolute: 1.4 10*3/uL (ref 0.7–3.1)
Lymphs: 27 %
MCH: 33.2 pg — ABNORMAL HIGH (ref 26.6–33.0)
MCHC: 35.2 g/dL (ref 31.5–35.7)
MCV: 95 fL (ref 79–97)
Monocytes Absolute: 0.4 10*3/uL (ref 0.1–0.9)
Monocytes: 8 %
Neutrophils Absolute: 3.2 10*3/uL (ref 1.4–7.0)
Neutrophils: 60 %
Platelets: 195 10*3/uL (ref 150–450)
RBC: 3.97 x10E6/uL — ABNORMAL LOW (ref 4.14–5.80)
RDW: 12.9 % (ref 11.6–15.4)
WBC: 5.4 10*3/uL (ref 3.4–10.8)

## 2020-01-22 LAB — COMPREHENSIVE METABOLIC PANEL
ALT: 11 IU/L (ref 0–44)
AST: 16 IU/L (ref 0–40)
Albumin/Globulin Ratio: 1.8 (ref 1.2–2.2)
Albumin: 4.2 g/dL (ref 3.6–4.6)
Alkaline Phosphatase: 57 IU/L (ref 39–117)
BUN/Creatinine Ratio: 16 (ref 10–24)
BUN: 17 mg/dL (ref 8–27)
Bilirubin Total: 0.4 mg/dL (ref 0.0–1.2)
CO2: 22 mmol/L (ref 20–29)
Calcium: 9.1 mg/dL (ref 8.6–10.2)
Chloride: 105 mmol/L (ref 96–106)
Creatinine, Ser: 1.08 mg/dL (ref 0.76–1.27)
GFR calc Af Amer: 70 mL/min/{1.73_m2} (ref >59)
GFR calc non Af Amer: 61 mL/min/{1.73_m2} (ref >59)
Globulin, Total: 2.3 g/dL (ref 1.5–4.5)
Glucose: 94 mg/dL (ref 65–99)
Potassium: 4.2 mmol/L (ref 3.5–5.2)
Sodium: 139 mmol/L (ref 134–144)
Total Protein: 6.5 g/dL (ref 6.0–8.5)

## 2020-01-22 LAB — TSH: TSH: 5.74 u[IU]/mL — ABNORMAL HIGH (ref 0.450–4.500)

## 2020-01-22 LAB — CARDIOVASCULAR RISK ASSESSMENT

## 2020-01-22 MED ORDER — LEVOTHYROXINE SODIUM 25 MCG PO TABS: 25.0000 ug | ORAL_TABLET | Freq: Every day | ORAL | 2 refills | Status: DC

## 2020-01-22 NOTE — Progress Notes (Signed)
Anemia improving, kidney and liver tests ok, Cholesterol OK, Thyroid high start 8mcg a day and recheck 8 weeks lp

## 2020-01-28 ENCOUNTER — Other Ambulatory Visit: Payer: Self-pay

## 2020-01-28 ENCOUNTER — Encounter: Payer: Self-pay | Admitting: Cardiology

## 2020-01-28 ENCOUNTER — Ambulatory Visit: Payer: Medicare Other | Admitting: Cardiology

## 2020-01-28 VITALS — BP 118/54 | HR 71 | Ht 69.0 in | Wt 118.0 lb

## 2020-01-28 DIAGNOSIS — E782 Mixed hyperlipidemia: Secondary | ICD-10-CM | POA: Diagnosis not present

## 2020-01-28 DIAGNOSIS — R0989 Other specified symptoms and signs involving the circulatory and respiratory systems: Secondary | ICD-10-CM | POA: Insufficient documentation

## 2020-01-28 DIAGNOSIS — N183 Chronic kidney disease, stage 3 unspecified: Secondary | ICD-10-CM | POA: Insufficient documentation

## 2020-01-28 DIAGNOSIS — I10 Essential (primary) hypertension: Secondary | ICD-10-CM | POA: Insufficient documentation

## 2020-01-28 DIAGNOSIS — R011 Cardiac murmur, unspecified: Secondary | ICD-10-CM | POA: Diagnosis not present

## 2020-01-28 DIAGNOSIS — N182 Chronic kidney disease, stage 2 (mild): Secondary | ICD-10-CM | POA: Insufficient documentation

## 2020-01-28 DIAGNOSIS — R9431 Abnormal electrocardiogram [ECG] [EKG]: Secondary | ICD-10-CM | POA: Diagnosis not present

## 2020-01-28 HISTORY — DX: Essential (primary) hypertension: I10

## 2020-01-28 HISTORY — DX: Other specified symptoms and signs involving the circulatory and respiratory systems: R09.89

## 2020-01-28 HISTORY — DX: Abnormal electrocardiogram (ECG) (EKG): R94.31

## 2020-01-28 MED ORDER — AMLODIPINE BESYLATE 5 MG PO TABS: 5.0000 mg | ORAL_TABLET | Freq: Every day | ORAL | 3 refills | Status: DC

## 2020-01-28 NOTE — Progress Notes (Signed)
Cardiology Office Note:    Date:  01/28/2020   ID:  Lucas Kline, DOB 08-Apr-1929, MRN [id number]  PCP:  Lucas Anes, MD  Cardiologist:  Lucas Lindau, MD   Referring MD: Lucas Kline,*    ASSESSMENT:    1. Mixed hyperlipidemia   2. Abnormal EKG   3. Essential hypertension   4. Murmur, cardiac   5. Carotid bruit, unspecified laterality   6. Bilateral carotid bruits    PLAN:    In order of problems listed above:  1. Primary prevention stressed with the patient.  Importance of compliance with diet medication stressed and he vocalized understanding.  His blood pressure is stable. 2. Mixed dyslipidemia: Lipids managed by primary care physician and patient is on statins. 3. History of Alzheimer's dementia: Followed by primary care.  Not clear to me the details. 4. Abnormal EKG: EKG reveals Wenckebach phenomenon.  In view of this I have stopped his verapamil and switch him to amlodipine 5 mg daily.  Today's EKG reveals sinus rhythm and nonspecific ST-T changes at her office.  I do not see any evidence of atrial fibrillation.  At any rate the patient is high fall potential and unstable gait and not a candidate for anticoagulation in either way even if he has history of atrial fibrillation. Bilateral carotid bruits for which we will obtain carotid Dopplers Cardiac murmur: Echocardiogram will be done to assess this. Patient will be seen in follow-up appointment in 6 months or earlier if the patient has any concerns    Medication Adjustments/Labs and Tests Ordered: Current medicines are reviewed at length with the patient today.  Concerns regarding medicines are outlined above.  No orders of the defined types were placed in this encounter.  No orders of the defined types were placed in this encounter.    History of Present Illness:    Lucas Kline is a 86 y.o. adult who is being seen today for the evaluation of abnormal EKG and possible  coronary artery disease at the request of Lucas Kline,*.  Patient is a pleasant 86 year old male.  He has past medical history of possible mild atherosclerotic disease by coronary angiography done 17 years ago.  Historically his daughter does not remember all the details.  He has history of mixed dyslipidemia and Alzheimer's disease.  EKG was suspicious of atrial fibrillation and therefore he was sent here for evaluation.  At the time of my evaluation is alert awake oriented and in no distress.  Patient denies any chest pain orthopnea PND or any palpitations or dizzy spells.  No syncope.  Past Medical History:  Diagnosis Date  . Alzheimer disease ([place])   . B12 deficiency    CHRONIC  . CKD (chronic kidney disease), stage II    MILD  . CKD (chronic kidney disease), stage III    MODERATE (CHRONIC)  . Coronary atherosclerosis    CHRONIC  . Essential hypertension, benign   . Essential tremor    CHRONIC  . Mixed hearing loss, bilateral    CHRONIC  . Mixed hyperlipidemia    CHRONIC   . Osteoarthritis    OF KNEE CHRONIC  . Personal history of malignant neoplasm of larynx 01/18/2020  . Unspecified arthropathy, lower leg   . Unspecified constipation    CHRONIC    Past Surgical History:  Procedure Laterality Date  . CATARACT EXTRACTION, BILATERAL      Current Medications: Current Meds  Medication Sig  . cyanocobalamin (,VITAMIN B-12,) 1000  MCG/ML injection Inject 1 mL (1,000 mcg total) into the muscle every 30 (thirty) days.  Marland Kitchen levothyroxine (SYNTHROID) 25 MCG tablet Take 1 tablet (25 mcg total) by mouth daily before breakfast.  . simvastatin (ZOCOR) 40 MG tablet Take 1 tablet (40 mg total) by mouth daily.  . [DISCONTINUED] verapamil (VERELAN PM) 240 MG 24 hr capsule Take 1 capsule (240 mg total) by mouth at bedtime.     Allergies:   Aricept [donepezil]   Social History   Socioeconomic History  . Marital status: Widowed    Spouse name: Not on file  . Number of children:  2  . Years of education: Not on file  . Highest education level: Not on file  Occupational History  . Occupation: hosiery mill  . Occupation: Copywriter, advertising  Tobacco Use  . Smoking status: Former Smoker    Types: Cigarettes    Quit date: 2008    Years since quitting: 13.3  . Smokeless tobacco: Current User    Types: Chew  Substance and Sexual Activity  . Alcohol use: No  . Drug use: No  . Sexual activity: Not Currently  Other Topics Concern  . Not on file  Social History Narrative  . Not on file   Social Determinants of Health   Financial Resource Strain:   . Difficulty of Paying Living Expenses:   Food Insecurity:   . Worried About Charity fundraiser in the Last Year:   . Arboriculturist in the Last Year:   Transportation Needs:   . Film/video editor (Medical):   Marland Kitchen Lack of Transportation (Non-Medical):   Physical Activity:   . Days of Exercise per Week:   . Minutes of Exercise per Session:   Stress:   . Feeling of Stress :   Social Connections:   . Frequency of Communication with Friends and Family:   . Frequency of Social Gatherings with Friends and Family:   . Attends Religious Services:   . Active Member of Clubs or Organizations:   . Attends Archivist Meetings:   Marland Kitchen Marital Status:      Family History: The patient's family history is not on file.  ROS:   Please see the history of present illness.    All other systems reviewed and are negative.  EKGs/Labs/Other Studies Reviewed:    The following studies were reviewed today: EKG from primary care physician's office reveals type I second-degree AV block, Wenckebach phenomena.  Recent Labs: 01/21/2020: ALT 11; BUN 17; Creatinine, Ser 1.08; Hemoglobin 13.2; Platelets 195; Potassium 4.2; Sodium 139; TSH 5.740  Recent Lipid Panel    Component Value Date/Time   CHOL 148 01/21/2020 1115   TRIG 72 01/21/2020 1115   HDL 61 01/21/2020 1115   CHOLHDL 2.4 01/21/2020 1115   LDLCALC 73  01/21/2020 1115    Physical Exam:    VS:  BP (!) 118/54 (BP Location: Left Arm, Patient Position: Sitting, Cuff Size: Normal)   Pulse 71   Ht 5\' 9"  (1.753 m)   Wt 118 lb (53.5 kg)   SpO2 95%   BMI 17.43 kg/m     Wt Readings from Last 3 Encounters:  01/28/20 118 lb (53.5 kg)  01/21/20 117 lb 3.2 oz (53.2 kg)  02/12/19 120 lb (54.4 kg)     GEN: Patient is in no acute distress HEENT: Normal NECK: No JVD; bilateral carotid bruits LYMPHATICS: No lymphadenopathy CARDIAC: S1 S2 regular, 2/6 systolic murmur at the apex. RESPIRATORY:  Clear to auscultation without rales, wheezing or rhonchi  ABDOMEN: Soft, non-tender, non-distended MUSCULOSKELETAL:  No edema; No deformity  SKIN: Warm and dry NEUROLOGIC:  Alert and oriented x 3 PSYCHIATRIC:  Normal affect    Signed, Lucas Lindau, MD  01/28/2020 2:09 PM    [facility]

## 2020-01-28 NOTE — Patient Instructions (Signed)
Medication Instructions:  Your physician has recommended you make the following change in your medication:   Stop taking Verpamil. Start taking Amlodipine 5 mg daily.  *If you need a refill on your cardiac medications before your next appointment, please call your pharmacy*   Lab Work: None ordered If you have labs (blood work) drawn today and your tests are completely normal, you will receive your results only by: Marland Kitchen MyChart Message (if you have MyChart) OR . A paper copy in the mail If you have any lab test that is abnormal or we need to change your treatment, we will call you to review the results.   Testing/Procedures: Your physician has requested that you have an echocardiogram. Echocardiography is a painless test that uses sound waves to create images of your heart. It provides your doctor with information about the size and shape of your heart and how well your heart's chambers and valves are working. This procedure takes approximately one hour. There are no restrictions for this procedure. Your physician has requested that you have a carotid ultrasound. This test is an ultrasound of the carotid arteries in your neck. It looks at blood flow through these arteries that supply the brain with blood. Allow one hour for this exam. There are no restrictions or special instructions.    Follow-Up: At [facility], you and your health needs are our priority.  As part of our continuing mission to provide you with exceptional heart care, we have created designated Provider Care Teams.  These Care Teams include your primary Cardiologist (physician) and Advanced Practice Providers (APPs -  Physician Assistants and Nurse Practitioners) who all work together to provide you with the care you need, when you need it.  We recommend signing up for the patient portal called "MyChart".  Sign up information is provided on this After Visit Summary.  MyChart is used to connect with patients for Virtual  Visits (Telemedicine).  Patients are able to view lab/test results, encounter notes, upcoming appointments, etc.  Non-urgent messages can be sent to your provider as well.   To learn more about what you can do with MyChart, go to [email or web address].    Your next appointment:   3 month(s)  The format for your next appointment:   In Person  Provider:   Jyl Heinz, MD   Other Instructions Either send the results of your blood pressure in your MyChart or mail to [address] [place], [place] [address]. Blood Pressure Record Sheet To take your blood pressure, you will need a blood pressure machine. You can buy a blood pressure machine (blood pressure monitor) at your clinic, drug store, or online. When choosing one, consider:  An automatic monitor that has an arm cuff.  A cuff that wraps snugly around your upper arm. You should be able to fit only one finger between your arm and the cuff.  A device that stores blood pressure reading results.  Do not choose a monitor that measures your blood pressure from your wrist or finger. Follow your health care provider's instructions for how to take your blood pressure. To use this form:  Get one reading in the morning (a.m.) 1-2 hors after your medicine. Get one reading in the evening (p.m.) before supper.  Take at least 2 readings with each blood pressure check. This makes sure the results are correct. Wait 1-2 minutes between measurements.  Write down the results in the spaces on this form.  Repeat this once a week, or as  told by your health care provider.  Make a follow-up appointment with your health care provider to discuss the results. Blood pressure log Date: _______________________  a.m. _____________________(1st reading) _____________________(2nd reading)  p.m. _____________________(1st reading) _____________________(2nd reading) Date: _______________________  a.m. _____________________(1st reading)  _____________________(2nd reading)  p.m. _____________________(1st reading) _____________________(2nd reading) Date: _______________________  a.m. _____________________(1st reading) _____________________(2nd reading)  p.m. _____________________(1st reading) _____________________(2nd reading) Date: _______________________  a.m. _____________________(1st reading) _____________________(2nd reading)  p.m. _____________________(1st reading) _____________________(2nd reading) Date: _______________________  a.m. _____________________(1st reading) _____________________(2nd reading)  p.m. _____________________(1st reading) _____________________(2nd reading) This information is not intended to replace advice given to you by your health care provider. Make sure you discuss any questions you have with your health care provider. Document Revised: 10/28/2017 Document Reviewed: 08/30/2017 Elsevier Patient Education  [place].  Echocardiogram An echocardiogram is a procedure that uses painless sound waves (ultrasound) to produce an image of the heart. Images from an echocardiogram can provide important information about:  Signs of coronary artery disease (CAD).  Aneurysm detection. An aneurysm is a weak or damaged part of an artery wall that bulges out from the normal force of blood pumping through the body.  Heart size and shape. Changes in the size or shape of the heart can be associated with certain conditions, including heart failure, aneurysm, and CAD.  Heart muscle function.  Heart valve function.  Signs of a past heart attack.  Fluid buildup around the heart.  Thickening of the heart muscle.  A tumor or infectious growth around the heart valves. Tell a health care provider about:  Any allergies you have.  All medicines you are taking, including vitamins, herbs, eye drops, creams, and over-the-counter medicines.  Any blood disorders you have.  Any surgeries you have  had.  Any medical conditions you have.  Whether you are pregnant or may be pregnant. What are the risks? Generally, this is a safe procedure. However, problems may occur, including:  Allergic reaction to dye (contrast) that may be used during the procedure. What happens before the procedure? No specific preparation is needed. You may eat and drink normally. What happens during the procedure?   An IV tube may be inserted into one of your veins.  You may receive contrast through this tube. A contrast is an injection that improves the quality of the pictures from your heart.  A gel will be applied to your chest.  A wand-like tool (transducer) will be moved over your chest. The gel will help to transmit the sound waves from the transducer.  The sound waves will harmlessly bounce off of your heart to allow the heart images to be captured in real-time motion. The images will be recorded on a computer. The procedure may vary among health care providers and hospitals. What happens after the procedure?  You may return to your normal, everyday life, including diet, activities, and medicines, unless your health care provider tells you not to do that. Summary  An echocardiogram is a procedure that uses painless sound waves (ultrasound) to produce an image of the heart.  Images from an echocardiogram can provide important information about the size and shape of your heart, heart muscle function, heart valve function, and fluid buildup around your heart.  You do not need to do anything to prepare before this procedure. You may eat and drink normally.  After the echocardiogram is completed, you may return to your normal, everyday life, unless your health care provider tells you not to do that. This information is  not intended to replace advice given to you by your health care provider. Make sure you discuss any questions you have with your health care provider. Document Revised: 12/21/2018  Document Reviewed: 10/02/2016 Elsevier Patient Education  [place].

## 2020-02-13 ENCOUNTER — Other Ambulatory Visit: Payer: Medicare Other

## 2020-02-19 ENCOUNTER — Ambulatory Visit (INDEPENDENT_AMBULATORY_CARE_PROVIDER_SITE_OTHER): Payer: Medicare Other

## 2020-02-19 ENCOUNTER — Other Ambulatory Visit: Payer: Self-pay

## 2020-02-19 DIAGNOSIS — R0989 Other specified symptoms and signs involving the circulatory and respiratory systems: Secondary | ICD-10-CM

## 2020-02-19 DIAGNOSIS — R011 Cardiac murmur, unspecified: Secondary | ICD-10-CM

## 2020-02-19 NOTE — Progress Notes (Signed)
Carotid duplex exam performed. Difficult exam.  Jimmy Colbin Jovel RDCS, RVT

## 2020-02-19 NOTE — Progress Notes (Signed)
Complete echocardiogram has been performed.  Jimmy Aries Kasa RDCS, RVT 

## 2020-02-20 ENCOUNTER — Telehealth: Payer: Self-pay

## 2020-02-20 NOTE — Telephone Encounter (Signed)
-----   Message from Jenean Lindau, MD sent at 02/20/2020  1:26 PM EDT ----- Mild atherosclerosis.  The results of the study is unremarkable. Please inform patient. I will discuss in detail at next appointment. Cc  primary care/referring physician Jenean Lindau, MD 02/20/2020 1:26 PM

## 2020-02-20 NOTE — Telephone Encounter (Signed)
Tried calling patient. No answer and no voicemail set up for me to leave a message. 

## 2020-02-27 ENCOUNTER — Telehealth: Payer: Self-pay

## 2020-02-27 NOTE — Telephone Encounter (Signed)
-----   Message from Jenean Lindau, MD sent at 02/20/2020  1:26 PM EDT ----- Mild atherosclerosis.  The results of the study is unremarkable. Please inform patient. I will discuss in detail at next appointment. Cc  primary care/referring physician Jenean Lindau, MD 02/20/2020 1:26 PM

## 2020-02-27 NOTE — Telephone Encounter (Signed)
Tried calling patient. No answer and no voicemail set up for me to leave a message. 

## 2020-02-28 ENCOUNTER — Telehealth: Payer: Self-pay

## 2020-02-28 NOTE — Telephone Encounter (Signed)
Tried calling patient. No answer and no voicemail set up for me to leave a message. 

## 2020-02-28 NOTE — Telephone Encounter (Signed)
-----   Message from Jenean Lindau, MD sent at 02/20/2020  1:26 PM EDT ----- Mild atherosclerosis.  The results of the study is unremarkable. Please inform patient. I will discuss in detail at next appointment. Cc  primary care/referring physician Jenean Lindau, MD 02/20/2020 1:26 PM

## 2020-02-28 NOTE — Telephone Encounter (Signed)
-----   Message from Jenean Lindau, MD sent at 02/20/2020  1:26 PM EDT ----- The results of the study is unremarkable. Please inform patient. I will discuss in detail at next appointment. Cc  primary care/referring physician Jenean Lindau, MD 02/20/2020 1:26 PM

## 2020-03-20 DIAGNOSIS — H52223 Regular astigmatism, bilateral: Secondary | ICD-10-CM | POA: Diagnosis not present

## 2020-03-20 DIAGNOSIS — H26493 Other secondary cataract, bilateral: Secondary | ICD-10-CM | POA: Diagnosis not present

## 2020-04-23 ENCOUNTER — Other Ambulatory Visit: Payer: Self-pay

## 2020-04-23 ENCOUNTER — Encounter: Payer: Self-pay | Admitting: Legal Medicine

## 2020-04-23 ENCOUNTER — Ambulatory Visit (INDEPENDENT_AMBULATORY_CARE_PROVIDER_SITE_OTHER): Payer: Medicare Other | Admitting: Legal Medicine

## 2020-04-23 VITALS — BP 110/60 | HR 75 | Temp 97.7°F | Resp 17 | Ht 66.93 in | Wt 116.4 lb

## 2020-04-23 DIAGNOSIS — R2681 Unsteadiness on feet: Secondary | ICD-10-CM

## 2020-04-23 DIAGNOSIS — G309 Alzheimer's disease, unspecified: Secondary | ICD-10-CM | POA: Diagnosis not present

## 2020-04-23 DIAGNOSIS — E782 Mixed hyperlipidemia: Secondary | ICD-10-CM | POA: Diagnosis not present

## 2020-04-23 DIAGNOSIS — E440 Moderate protein-calorie malnutrition: Secondary | ICD-10-CM

## 2020-04-23 DIAGNOSIS — F0280 Dementia in other diseases classified elsewhere without behavioral disturbance: Secondary | ICD-10-CM

## 2020-04-23 DIAGNOSIS — H906 Mixed conductive and sensorineural hearing loss, bilateral: Secondary | ICD-10-CM

## 2020-04-23 DIAGNOSIS — I482 Chronic atrial fibrillation, unspecified: Secondary | ICD-10-CM

## 2020-04-23 DIAGNOSIS — E039 Hypothyroidism, unspecified: Secondary | ICD-10-CM | POA: Diagnosis not present

## 2020-04-23 DIAGNOSIS — I2510 Atherosclerotic heart disease of native coronary artery without angina pectoris: Secondary | ICD-10-CM | POA: Diagnosis not present

## 2020-04-23 DIAGNOSIS — I10 Essential (primary) hypertension: Secondary | ICD-10-CM

## 2020-04-23 MED ORDER — CYANOCOBALAMIN 1000 MCG/ML IJ SOLN: 1000.0000 ug | INTRAMUSCULAR | 2 refills | Status: DC

## 2020-04-23 MED ORDER — MEMANTINE HCL 5 MG PO TABS: 5.0000 mg | ORAL_TABLET | Freq: Two times a day (BID) | ORAL | 4 refills | Status: DC

## 2020-04-23 NOTE — Progress Notes (Signed)
Subjective:  Patient ID: Lucas Kline, adult    DOB: 09-Nov-1928  Age: 87 y.o. MRN: [id number]  Chief Complaint  Patient presents with  . Hypertension  . Hypothyroidism    HPI: Chronic visit  CORONARY ARTERY DISEASE  Patient presents in follow up of CAD. Patient was diagnosed in 2010. The patient has no associated CHF. The patient is currently taking a beta blocker, statin, and aspirin. CAD was diagnosed 10 years ago.  Patient is having no angina. Patient has used no NTG.  Patient is followed by cardiology.  Patient had none . Last angiography was unkown, last echocardiogram na  Patient has some DAT and memory is stable.He could not tolerate aricept.  Patient presents for follow up of hypertension.  Patient tolerating verapamil well with side effects.  Patient was diagnosed with hypertension 2010 so has been treated for hypertension for 10 years.Patient is working on maintaining diet and exercise regimen and follows up as directed. Complication include atrial fibrillation  Patient presents with hyperlipidemia.  Compliance with treatment has been good; patient takes medicines as directed, maintains low cholesterol diet, follows up as directed, and maintains exercise regimen.  Patient is using simvastatin without problems..  Patient has HYPOTHYROIDISM.  Diagnosed 20 years ago.  Patient has stable thyroid readings.  Patient is having stable.  Last TSH was nrmal.  continue dosage of thyroid medicine.   Current Outpatient Medications on File Prior to Visit  Medication Sig Dispense Refill  . amLODipine (NORVASC) 5 MG tablet Take 1 tablet (5 mg total) by mouth daily. 180 tablet 3  . levothyroxine (SYNTHROID) 25 MCG tablet Take 1 tablet (25 mcg total) by mouth daily before breakfast. 30 tablet 2  . simvastatin (ZOCOR) 40 MG tablet Take 1 tablet (40 mg total) by mouth daily. 90 tablet 2  . verapamil (CALAN-SR) 240 MG CR tablet Take 240 mg by mouth daily.    . cyanocobalamin (,VITAMIN  B-12,) 1000 MCG/ML injection Inject 1 mL (1,000 mcg total) into the muscle every 30 (thirty) days. (Patient not taking: Reported on 04/23/2020) 1 mL 2   No current facility-administered medications on file prior to visit.   Past Medical History:  Diagnosis Date  . Alzheimer disease ([place])   . B12 deficiency    CHRONIC  . CKD (chronic kidney disease), stage II    MILD  . CKD (chronic kidney disease), stage III    MODERATE (CHRONIC)  . Coronary atherosclerosis    CHRONIC  . Essential hypertension, benign   . Essential tremor    CHRONIC  . Mixed hearing loss, bilateral    CHRONIC  . Mixed hyperlipidemia    CHRONIC   . Osteoarthritis    OF KNEE CHRONIC  . Personal history of malignant neoplasm of larynx 01/18/2020  . Unspecified arthropathy, lower leg   . Unspecified constipation    CHRONIC   Past Surgical History:  Procedure Laterality Date  . CATARACT EXTRACTION, BILATERAL      No family history on file. Social History   Socioeconomic History  . Marital status: Widowed    Spouse name: Not on file  . Number of children: 2  . Years of education: Not on file  . Highest education level: Not on file  Occupational History  . Occupation: hosiery mill  . Occupation: Copywriter, advertising  Tobacco Use  . Smoking status: Former Smoker    Types: Cigarettes    Quit date: 2008    Years since quitting: 13.6  . Smokeless tobacco:  Current User    Types: Chew  Vaping Use  . Vaping Use: Never used  Substance and Sexual Activity  . Alcohol use: No  . Drug use: No  . Sexual activity: Not Currently  Other Topics Concern  . Not on file  Social History Narrative  . Not on file   Social Determinants of Health   Financial Resource Strain:   . Difficulty of Paying Living Expenses:   Food Insecurity:   . Worried About Charity fundraiser in the Last Year:   . Arboriculturist in the Last Year:   Transportation Needs:   . Film/video editor (Medical):   Marland Kitchen Lack of Transportation  (Non-Medical):   Physical Activity:   . Days of Exercise per Week:   . Minutes of Exercise per Session:   Stress:   . Feeling of Stress :   Social Connections:   . Frequency of Communication with Friends and Family:   . Frequency of Social Gatherings with Friends and Family:   . Attends Religious Services:   . Active Member of Clubs or Organizations:   . Attends Archivist Meetings:   Marland Kitchen Marital Status:     Review of Systems  Constitutional: Negative.   HENT: Negative.   Eyes: Negative.   Respiratory: Negative.   Cardiovascular: Negative.   Gastrointestinal: Negative.   Endocrine: Negative.   Genitourinary: Negative.   Musculoskeletal: Negative.   Skin: Negative.   Neurological: Negative.   Psychiatric/Behavioral: Negative.      Objective:  BP 110/60 (BP Location: Right Arm, Patient Position: Sitting)   Pulse 75   Temp 97.7 F (36.5 C) (Temporal)   Resp 17   Ht 5' 6.93" (1.7 m)   Wt 116 lb 6.4 oz (52.8 kg)   SpO2 94%   BMI 18.27 kg/m   BP/Weight 04/23/2020 01/28/2020 1/65/5374  Systolic BP 827 078 675  Diastolic BP 60 54 62  Wt. (Lbs) 116.4 118 117.2  BMI 18.27 17.43 19.06    Physical Exam Vitals reviewed.  Constitutional:      Appearance: Normal appearance.  HENT:     Head: Normocephalic.     Right Ear: Tympanic membrane normal.     Left Ear: Tympanic membrane normal.     Nose: Nose normal.     Mouth/Throat:     Mouth: Mucous membranes are moist.  Eyes:     Conjunctiva/sclera: Conjunctivae normal.     Pupils: Pupils are equal, round, and reactive to light.  Cardiovascular:     Rate and Rhythm: Normal rate. Rhythm irregular.     Heart sounds: Normal heart sounds.  Pulmonary:     Breath sounds: Normal breath sounds.  Abdominal:     General: Abdomen is flat. Bowel sounds are normal.     Palpations: Abdomen is soft.  Musculoskeletal:     Cervical back: Normal range of motion and neck supple.     Comments: Muscle wasting  Skin:     Capillary Refill: Capillary refill takes less than 2 seconds.  Neurological:     General: No focal deficit present.     Mental Status: Lucas Dessert Hulbert "[place]" is alert.  Psychiatric:        Mood and Affect: Mood normal.       Lab Results  Component Value Date   WBC 5.4 01/21/2020   HGB 13.2 01/21/2020   HCT 37.5 01/21/2020   PLT 195 01/21/2020   GLUCOSE 94 01/21/2020   CHOL 148  01/21/2020   TRIG 72 01/21/2020   HDL 61 01/21/2020   LDLCALC 73 01/21/2020   ALT 11 01/21/2020   AST 16 01/21/2020   NA 139 01/21/2020   K 4.2 01/21/2020   CL 105 01/21/2020   CREATININE 1.08 01/21/2020   BUN 17 01/21/2020   CO2 22 01/21/2020   TSH 5.740 (H) 01/21/2020      Assessment & Plan:   1. Primary hypothyroidism - TSH Patient is known to have hypothyroidism and is n treatment with levothyroxine 67mcg.  Patient was diagnosed 10 years ago.  Other treatment includes none.  Patient is compliant with medicines and last TSH 6 months ago.  Last TSH was normal.  2. Alzheimer's dementia without behavioral disturbance, unspecified timing of dementia onset ([place]) Patient's dementia is stable, he was unable to tolerate aricept, we will try namenda  3. Essential hypertension, benign - CBC with Differential/Platelet - Comprehensive metabolic panel An individual hypertension care plan was established and reinforced today.  The patient's status was assessed using clinical findings on exam and labs or diagnostic tests. The patient's success at meeting treatment goals on disease specific evidence-based guidelines and found to be well controlled. SELF MANAGEMENT: The patient and I together assessed ways to personally work towards obtaining the recommended goals. RECOMMENDATIONS: avoid decongestants found in common cold remedies, decrease consumption of alcohol, perform routine monitoring of BP with home BP cuff, exercise, reduction of dietary salt, take medicines as prescribed, try not to miss doses  and quit smoking.  Regular exercise and maintaining a healthy weight is needed.  Stress reduction may help. A CLINICAL SUMMARY including written plan identify barriers to care unique to individual due to social or financial issues.  We attempt to mutually creat solutions for individual and family understanding.  4. Mixed hyperlipidemia - Lipid panel AN INDIVIDUAL CARE PLAN for hyperlipidemia/ cholesterol was established and reinforced today.  The patient's status was assessed using clinical findings on exam, lab and other diagnostic tests. The patient's disease status was assessed based on evidence-based guidelines and found to be well controlled. MEDICATIONS were reviewed. SELF MANAGEMENT GOALS have been discussed and patient's success at attaining the goal of low cholesterol was assessed. RECOMMENDATION given include regular exercise 3 days a week and low cholesterol/low fat diet. CLINICAL SUMMARY including written plan to identify barriers unique to the patient due to social or economic  reasons was discussed.  5. Atherosclerosis of native coronary artery of native heart without angina pectoris Patient's CAD was assessed using history and physical along with other information to maximize treatment.  Evidence based criteria was use in deciding proper management for this disease process.  Patient's CAD is  under good control.therapy continue present management.  6. Moderate protein-calorie malnutrition ([place]) Supplement nutrition with protein/calorie supplement with meals to improve nutritional status.  7. Mixed conductive and sensorineural hearing loss of both ears Patient now has hearing aids  8. Unstable gait He is improved with physical therapy  9. Chronic atrial fibrillation ([facility])  CAF is stable and patient is not on anticoagulation    Orders Placed This Encounter  Procedures  . Lipid panel  . TSH  . CBC with Differential/Platelet  . Comprehensive metabolic panel      Follow-up: No follow-ups on file.  An After Visit Summary was printed and given to the patient.  [place] [phone]

## 2020-04-24 ENCOUNTER — Other Ambulatory Visit: Payer: Self-pay

## 2020-04-24 ENCOUNTER — Other Ambulatory Visit: Payer: Self-pay | Admitting: Legal Medicine

## 2020-04-24 DIAGNOSIS — E039 Hypothyroidism, unspecified: Secondary | ICD-10-CM

## 2020-04-24 LAB — COMPREHENSIVE METABOLIC PANEL
ALT: 8 IU/L (ref 0–44)
AST: 11 IU/L (ref 0–40)
Albumin/Globulin Ratio: 1.9 (ref 1.2–2.2)
Albumin: 4.4 g/dL (ref 3.6–4.6)
Alkaline Phosphatase: 58 IU/L (ref 48–121)
BUN/Creatinine Ratio: 19 (ref 10–24)
BUN: 21 mg/dL (ref 8–27)
Bilirubin Total: 0.5 mg/dL (ref 0.0–1.2)
CO2: 21 mmol/L (ref 20–29)
Calcium: 9.1 mg/dL (ref 8.6–10.2)
Chloride: 105 mmol/L (ref 96–106)
Creatinine, Ser: 1.08 mg/dL (ref 0.76–1.27)
GFR calc Af Amer: 70 mL/min/{1.73_m2} (ref >59)
GFR calc non Af Amer: 61 mL/min/{1.73_m2} (ref >59)
Globulin, Total: 2.3 g/dL (ref 1.5–4.5)
Glucose: 85 mg/dL (ref 65–99)
Potassium: 4.5 mmol/L (ref 3.5–5.2)
Sodium: 139 mmol/L (ref 134–144)
Total Protein: 6.7 g/dL (ref 6.0–8.5)

## 2020-04-24 LAB — CBC WITH DIFFERENTIAL/PLATELET
Basophils Absolute: 0.1 10*3/uL (ref 0.0–0.2)
Basos: 1 %
EOS (ABSOLUTE): 0.2 10*3/uL (ref 0.0–0.4)
Eos: 4 %
Hematocrit: 38.8 % (ref 37.5–51.0)
Hemoglobin: 12.9 g/dL — ABNORMAL LOW (ref 13.0–17.7)
Immature Grans (Abs): 0 10*3/uL (ref 0.0–0.1)
Immature Granulocytes: 0 %
Lymphocytes Absolute: 1.3 10*3/uL (ref 0.7–3.1)
Lymphs: 24 %
MCH: 32.1 pg (ref 26.6–33.0)
MCHC: 33.2 g/dL (ref 31.5–35.7)
MCV: 97 fL (ref 79–97)
Monocytes Absolute: 0.5 10*3/uL (ref 0.1–0.9)
Monocytes: 10 %
Neutrophils Absolute: 3.3 10*3/uL (ref 1.4–7.0)
Neutrophils: 61 %
Platelets: 183 10*3/uL (ref 150–450)
RBC: 4.02 x10E6/uL — ABNORMAL LOW (ref 4.14–5.80)
RDW: 12.5 % (ref 11.6–15.4)
WBC: 5.4 10*3/uL (ref 3.4–10.8)

## 2020-04-24 LAB — LIPID PANEL
Chol/HDL Ratio: 2.3 ratio (ref 0.0–5.0)
Cholesterol, Total: 147 mg/dL (ref 100–199)
HDL: 63 mg/dL (ref >39)
LDL Chol Calc (NIH): 71 mg/dL (ref 0–99)
Triglycerides: 64 mg/dL (ref 0–149)
VLDL Cholesterol Cal: 13 mg/dL (ref 5–40)

## 2020-04-24 LAB — TSH: TSH: 5.14 u[IU]/mL — ABNORMAL HIGH (ref 0.450–4.500)

## 2020-04-24 LAB — VITAMIN B12: Vitamin B-12: 1039 pg/mL (ref 232–1245)

## 2020-04-24 LAB — CARDIOVASCULAR RISK ASSESSMENT

## 2020-04-24 MED ORDER — LEVOTHYROXINE SODIUM 50 MCG PO TABS: 50.0000 ug | ORAL_TABLET | Freq: Every day | ORAL | 3 refills | Status: DC

## 2020-04-24 NOTE — Progress Notes (Signed)
B12 level 2039- normal level lp

## 2020-04-24 NOTE — Progress Notes (Signed)
Cholesterol normal, TSH 5.14, increase thyroid to 68mcg, mild anemia, kidney and liver tests normal, recheck thyroid 2 months lp

## 2020-04-25 ENCOUNTER — Ambulatory Visit: Payer: Medicare Other | Admitting: Cardiology

## 2020-05-02 IMAGING — CR CHEST - 2 VIEW
2 series · 2 of 2 positions shown · non-contrast
Comparison: February 27, 2018

CLINICAL DATA: Right-sided chest pain.

EXAM:
CHEST - 2 VIEW

[chest pa]
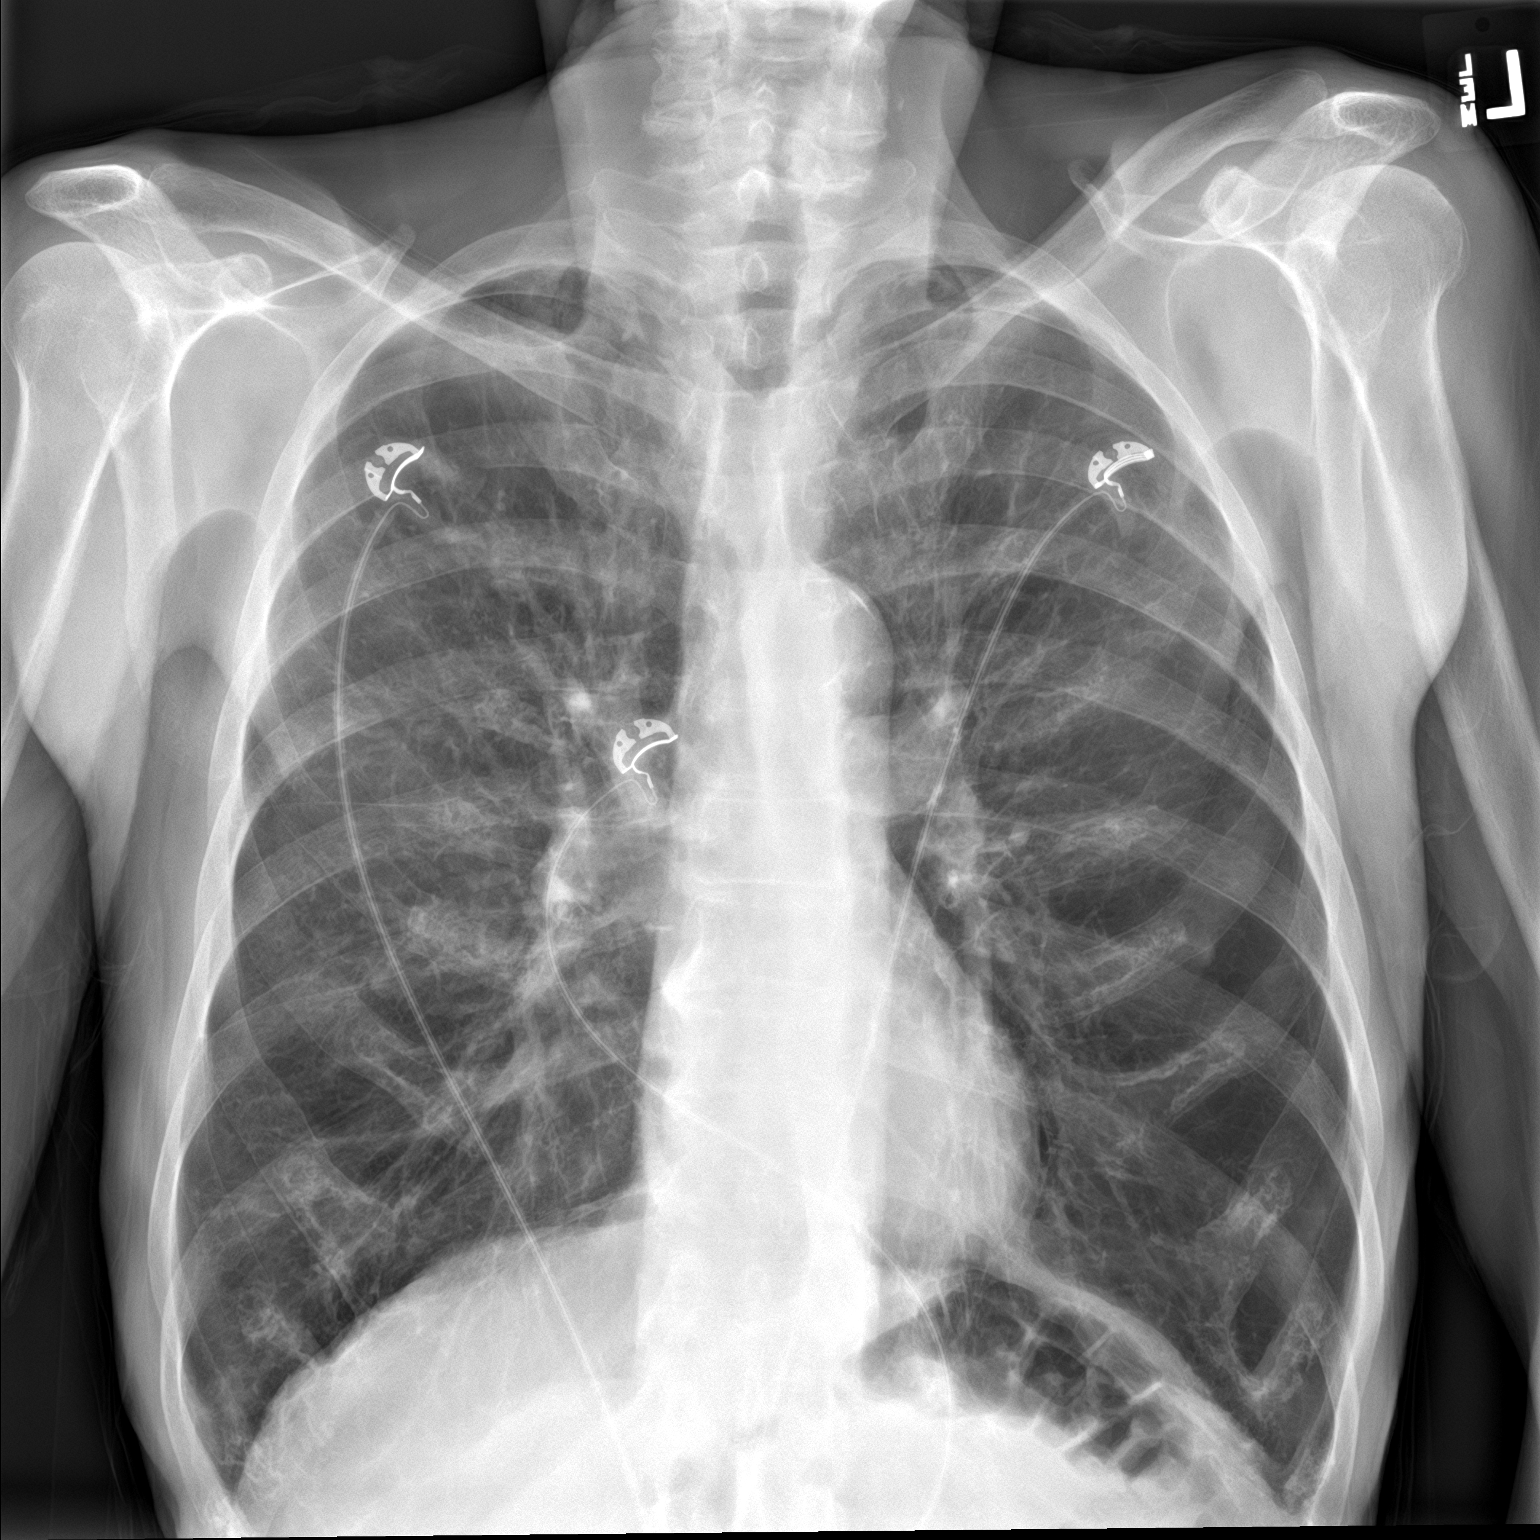

[chest lat]
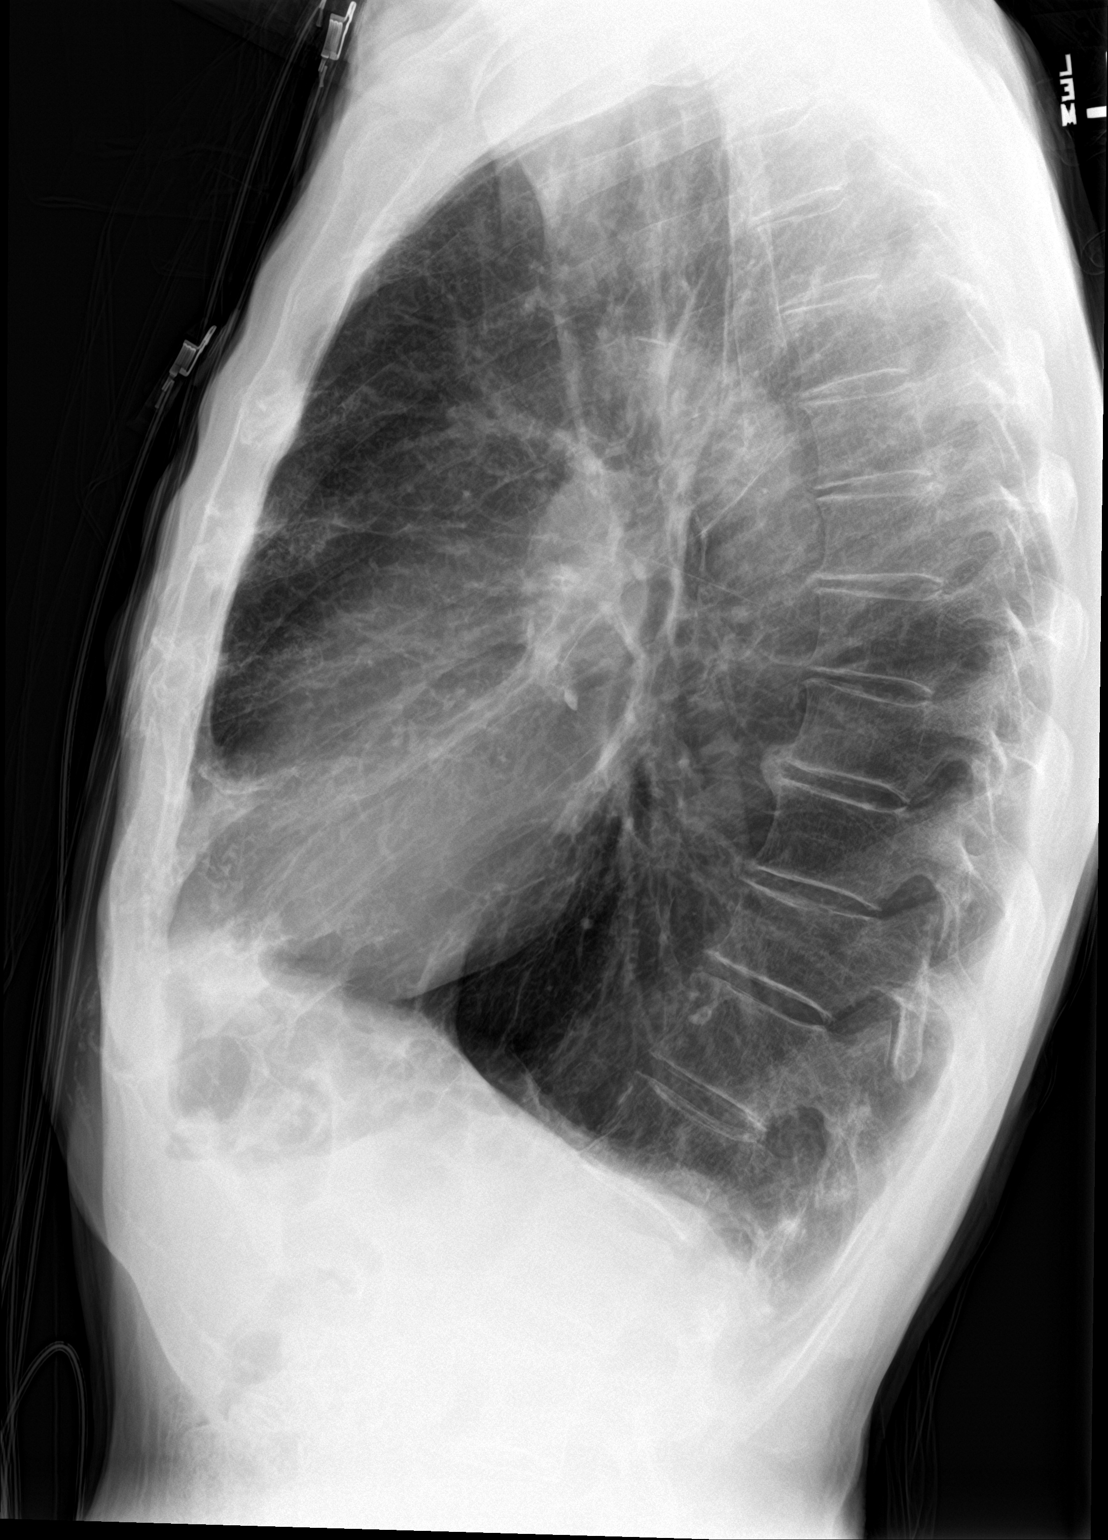

[2 of 2 positions shown; findings below may reference images not displayed]

FINDINGS: Severe emphysematous changes, particularly in the left base. No
pneumothorax. The heart, hila, and mediastinum are normal.
Hyperinflation of the lungs identified. No nodules, masses, or focal
infiltrates.
IMPRESSION: Emphysema with hyperinflation of the lungs. No other acute
abnormalities.

## 2020-05-15 ENCOUNTER — Other Ambulatory Visit: Payer: Self-pay | Admitting: Legal Medicine

## 2020-05-15 DIAGNOSIS — F0280 Dementia in other diseases classified elsewhere without behavioral disturbance: Secondary | ICD-10-CM

## 2020-05-26 DIAGNOSIS — H11442 Conjunctival cysts, left eye: Secondary | ICD-10-CM | POA: Diagnosis not present

## 2020-05-29 DIAGNOSIS — D485 Neoplasm of uncertain behavior of skin: Secondary | ICD-10-CM | POA: Diagnosis not present

## 2020-07-09 ENCOUNTER — Other Ambulatory Visit: Payer: Self-pay | Admitting: Legal Medicine

## 2020-07-14 ENCOUNTER — Encounter: Payer: Self-pay | Admitting: Legal Medicine

## 2020-07-23 ENCOUNTER — Other Ambulatory Visit: Payer: Self-pay | Admitting: Legal Medicine

## 2020-07-23 DIAGNOSIS — E538 Deficiency of other specified B group vitamins: Secondary | ICD-10-CM | POA: Insufficient documentation

## 2020-07-23 DIAGNOSIS — I2510 Atherosclerotic heart disease of native coronary artery without angina pectoris: Secondary | ICD-10-CM | POA: Insufficient documentation

## 2020-07-23 DIAGNOSIS — H906 Mixed conductive and sensorineural hearing loss, bilateral: Secondary | ICD-10-CM | POA: Insufficient documentation

## 2020-07-23 DIAGNOSIS — N182 Chronic kidney disease, stage 2 (mild): Secondary | ICD-10-CM | POA: Insufficient documentation

## 2020-07-23 DIAGNOSIS — G250 Essential tremor: Secondary | ICD-10-CM | POA: Insufficient documentation

## 2020-07-24 ENCOUNTER — Other Ambulatory Visit: Payer: Self-pay

## 2020-07-24 ENCOUNTER — Encounter: Payer: Self-pay | Admitting: Legal Medicine

## 2020-07-24 ENCOUNTER — Encounter: Payer: Self-pay | Admitting: Cardiology

## 2020-07-24 ENCOUNTER — Ambulatory Visit (INDEPENDENT_AMBULATORY_CARE_PROVIDER_SITE_OTHER): Payer: Medicare Other | Admitting: Legal Medicine

## 2020-07-24 ENCOUNTER — Ambulatory Visit: Payer: Medicare Other | Admitting: Cardiology

## 2020-07-24 ENCOUNTER — Ambulatory Visit (INDEPENDENT_AMBULATORY_CARE_PROVIDER_SITE_OTHER): Payer: Medicare Other

## 2020-07-24 VITALS — BP 110/50 | HR 81 | Temp 98.1°F | Resp 16 | Ht 66.54 in | Wt 112.0 lb

## 2020-07-24 VITALS — BP 118/52 | HR 86 | Ht 63.0 in | Wt 116.8 lb

## 2020-07-24 DIAGNOSIS — Z681 Body mass index (BMI) 19 or less, adult: Secondary | ICD-10-CM

## 2020-07-24 DIAGNOSIS — R002 Palpitations: Secondary | ICD-10-CM

## 2020-07-24 DIAGNOSIS — R001 Bradycardia, unspecified: Secondary | ICD-10-CM

## 2020-07-24 DIAGNOSIS — G309 Alzheimer's disease, unspecified: Secondary | ICD-10-CM | POA: Diagnosis not present

## 2020-07-24 DIAGNOSIS — I2510 Atherosclerotic heart disease of native coronary artery without angina pectoris: Secondary | ICD-10-CM

## 2020-07-24 DIAGNOSIS — I482 Chronic atrial fibrillation, unspecified: Secondary | ICD-10-CM

## 2020-07-24 DIAGNOSIS — I10 Essential (primary) hypertension: Secondary | ICD-10-CM

## 2020-07-24 DIAGNOSIS — E782 Mixed hyperlipidemia: Secondary | ICD-10-CM

## 2020-07-24 DIAGNOSIS — E039 Hypothyroidism, unspecified: Secondary | ICD-10-CM | POA: Diagnosis not present

## 2020-07-24 DIAGNOSIS — H906 Mixed conductive and sensorineural hearing loss, bilateral: Secondary | ICD-10-CM

## 2020-07-24 DIAGNOSIS — E440 Moderate protein-calorie malnutrition: Secondary | ICD-10-CM

## 2020-07-24 DIAGNOSIS — N1831 Chronic kidney disease, stage 3a: Secondary | ICD-10-CM

## 2020-07-24 DIAGNOSIS — I495 Sick sinus syndrome: Secondary | ICD-10-CM | POA: Diagnosis not present

## 2020-07-24 DIAGNOSIS — F0280 Dementia in other diseases classified elsewhere without behavioral disturbance: Secondary | ICD-10-CM

## 2020-07-24 MED ORDER — SIMVASTATIN 80 MG PO TABS: 80.0000 mg | ORAL_TABLET | Freq: Every day | ORAL | 3 refills | Status: DC

## 2020-07-24 MED ORDER — SIMVASTATIN 20 MG PO TABS: 20.0000 mg | ORAL_TABLET | Freq: Every day | ORAL | 3 refills | Status: DC

## 2020-07-24 MED ORDER — ASPIRIN EC 81 MG PO TBEC: 81.0000 mg | DELAYED_RELEASE_TABLET | Freq: Every day | ORAL | 2 refills | Status: DC

## 2020-07-24 NOTE — Patient Instructions (Addendum)
Medication Instructions:  Your physician has recommended you make the following change in your medication:   Stop amlodipine. Decrease your simvastatin to 20 mg daily. Take 1/2 tablet of current prescription.  *If you need a refill on your cardiac medications before your next appointment, please call your pharmacy*   Lab Work: None ordered If you have labs (blood work) drawn today and your tests are completely normal, you will receive your results only by: Marland Kitchen MyChart Message (if you have MyChart) OR . A paper copy in the mail If you have any lab test that is abnormal or we need to change your treatment, we will call you to review the results.   Testing/Procedures:  WHY IS MY DOCTOR PRESCRIBING ZIO? The Zio system is proven and trusted by physicians to detect and diagnose irregular heart rhythms -- and has been prescribed to hundreds of thousands of patients.  The FDA has cleared the Zio system to monitor for many different kinds of irregular heart rhythms. In a study, physicians were able to reach a diagnosis 90% of the time with the Zio system1.  You can wear the Zio monitor -- a small, discreet, comfortable patch -- during your normal day-to-day activity, including while you sleep, shower, and exercise, while it records every single heartbeat for analysis.  1Barrett, P., et al. Comparison of 24 Hour Holter Monitoring Versus 14 Day Novel Adhesive Patch Electrocardiographic Monitoring. [place], 2014.  ZIO VS. HOLTER MONITORING The Zio monitor can be comfortably worn for up to 14 days. Holter monitors can be worn for 24 to 48 hours, limiting the time to record any irregular heart rhythms you may have. Zio is able to capture data for the 51% of patients who have their first symptom-triggered arrhythmia after 48 hours.1  LIVE WITHOUT RESTRICTIONS The Zio ambulatory cardiac monitor is a small, unobtrusive, and water-resistant patch--you might even forget you're wearing  it. The Zio monitor records and stores every beat of your heart, whether you're sleeping, working out, or showering.  Wear the monitor for 1 week. Remove 07/31/20.   Follow-Up: At [facility], you and your health needs are our priority.  As part of our continuing mission to provide you with exceptional heart care, we have created designated Provider Care Teams.  These Care Teams include your primary Cardiologist (physician) and Advanced Practice Providers (APPs -  Physician Assistants and Nurse Practitioners) who all work together to provide you with the care you need, when you need it.  We recommend signing up for the patient portal called "MyChart".  Sign up information is provided on this After Visit Summary.  MyChart is used to connect with patients for Virtual Visits (Telemedicine).  Patients are able to view lab/test results, encounter notes, upcoming appointments, etc.  Non-urgent messages can be sent to your provider as well.   To learn more about what you can do with MyChart, go to [email or web address].    Your next appointment:   3 month(s)  The format for your next appointment:   In Person  Provider:   Jyl Heinz, MD   Other Instructions NA

## 2020-07-24 NOTE — Progress Notes (Signed)
Cardiology Office Note:    Date:  07/24/2020   ID:  Lucas Kline, DOB Apr 12, 1929, MRN [id number]  PCP:  Lucas Anes, MD  Cardiologist:  Lucas Lindau, MD   Referring MD: Lucas Kline,*    ASSESSMENT:    1. Atherosclerosis of native coronary artery of native heart without angina pectoris   2. Essential hypertension   3. Mixed hyperlipidemia    PLAN:    In order of problems listed above:  1. Primary prevention stressed with the patient.  Importance of compliance with diet medication stressed any vocalized understanding. 2. Essential hypertension: Blood pressure stable.  I will stop his amlodipine at this time.  I want to understand his heart rate and rhythm and that we will do a 1 week monitor.  I reviewed lab work including TSH and it is fine. 3. Mixed dyslipidemia: Lipids managed by primary care physician and patient is on therapy with statins and diet was emphasized.  I cut down his simvastatin to 20 since he is taking verapamil.  If this does not achieve his target then another statin could be considered which does not interact with verapamil such as rosuvastatin or atorvastatin.  I will leave this to the discretion of his primary care provider. 4. There was a question of atrial fibrillation but I do not see that in his EKGs.  He is frail and that is the other issue with the possibility of anticoagulation for any remote atrial fibrillation.  I do not think is a candidate because of his frail nature and unstable gait. 5. Patient will be seen in follow-up appointment in 2 months or earlier if the patient has any concerns    Medication Adjustments/Labs and Tests Ordered: Current medicines are reviewed at length with the patient today.  Concerns regarding medicines are outlined above.  No orders of the defined types were placed in this encounter.  No orders of the defined types were placed in this encounter.    No chief complaint on file.     History of Present Illness:    Lucas Kline is a 86 y.o. adult.  Patient was evaluated by me for atrial fibrillation.  He has history of essential hypertension.  I found him to have slow heart rates and therefore discontinued verapamil and added amlodipine.  Unfortunately patient is taking both these medications.  No chest pain orthopnea or PND no dizziness or palpitations.  At the time of my evaluation, the patient is alert awake oriented and in no distress. EKG reveals sinus rhythm and nonspecific ST-T changes  Past Medical History:  Diagnosis Date  . Abnormal EKG 01/28/2020  . Adult BMI <19 kg/sq m 01/21/2020  . Alzheimer disease ([place])   . Atherosclerotic heart disease of native coronary artery without angina pectoris    CHRONIC   . B12 deficiency    CHRONIC  . Bilateral carotid bruits 01/28/2020  . Chronic atrial fibrillation ([place]) 01/21/2020  . CKD (chronic kidney disease), stage II    MILD  . CKD (chronic kidney disease), stage III (HCC)    MODERATE (CHRONIC)  . Coronary atherosclerosis    CHRONIC  . Essential hypertension 01/28/2020  . Essential hypertension, benign   . Essential tremor    CHRONIC  . Malnutrition of moderate degree ([place]) 01/21/2020  . Mixed hearing loss, bilateral    CHRONIC  . Mixed hyperlipidemia    CHRONIC   . Obstructive chronic bronchitis without exacerbation ([place]) 01/21/2020  . Osteoarthritis  OF KNEE CHRONIC  . Personal history of malignant neoplasm of larynx 01/18/2020  . Primary hypothyroidism 01/22/2020  . Unspecified arthropathy, lower leg   . Unspecified constipation    CHRONIC  . Unstable gait 01/21/2020    Past Surgical History:  Procedure Laterality Date  . CATARACT EXTRACTION, BILATERAL      Current Medications: Current Meds  Medication Sig  . cyanocobalamin (,VITAMIN B-12,) 1000 MCG/ML injection INJECT 1 ML (1,000 MCG TOTAL) INTO THE MUSCLE EVERY 30 (THIRTY) DAYS.  Marland Kitchen levothyroxine (SYNTHROID) 50 MCG tablet Take 1 tablet  (50 mcg total) by mouth daily.  . memantine (NAMENDA) 5 MG tablet TAKE 1 TABLET BY MOUTH TWICE A DAY  . simvastatin (ZOCOR) 40 MG tablet Take 1 tablet (40 mg total) by mouth daily.  . verapamil (CALAN-SR) 240 MG CR tablet TAKE 1 TABLET BY MOUTH EVERY DAY     Allergies:   Aricept [donepezil]   Social History   Socioeconomic History  . Marital status: Widowed    Spouse name: Not on file  . Number of children: 2  . Years of education: Not on file  . Highest education level: Not on file  Occupational History  . Occupation: hosiery mill  . Occupation: Copywriter, advertising  Tobacco Use  . Smoking status: Former Smoker    Types: Cigarettes    Quit date: 2008    Years since quitting: 13.8  . Smokeless tobacco: Current User    Types: Chew  . Tobacco comment: 1 pouch sometimes  Vaping Use  . Vaping Use: Never used  Substance and Sexual Activity  . Alcohol use: No  . Drug use: No  . Sexual activity: Not Currently  Other Topics Concern  . Not on file  Social History Narrative  . Not on file   Social Determinants of Health   Financial Resource Strain:   . Difficulty of Paying Living Expenses: Not on file  Food Insecurity:   . Worried About Charity fundraiser in the Last Year: Not on file  . Ran Out of Food in the Last Year: Not on file  Transportation Needs:   . Lack of Transportation (Medical): Not on file  . Lack of Transportation (Non-Medical): Not on file  Physical Activity:   . Days of Exercise per Week: Not on file  . Minutes of Exercise per Session: Not on file  Stress:   . Feeling of Stress : Not on file  Social Connections:   . Frequency of Communication with Friends and Family: Not on file  . Frequency of Social Gatherings with Friends and Family: Not on file  . Attends Religious Services: Not on file  . Active Member of Clubs or Organizations: Not on file  . Attends Archivist Meetings: Not on file  . Marital Status: Not on file     Family  History: The patient's family history is negative for Hypertension, Heart disease, Cancer, and Diabetes.  ROS:   Please see the history of present illness.    All other systems reviewed and are negative.  EKGs/Labs/Other Studies Reviewed:    The following studies were reviewed today: IMPRESSIONS    1. Left ventricular ejection fraction, by estimation, is 55 to 60%. The  left ventricle has normal function. The left ventricle has no regional  wall motion abnormalities. Left ventricular diastolic parameters are  consistent with Grade I diastolic  dysfunction (impaired relaxation).  2. Right ventricular systolic function is normal. The right ventricular  size is normal.  There is normal pulmonary artery systolic pressure.  3. The mitral valve is normal in structure. No evidence of mitral valve  regurgitation. No evidence of mitral stenosis.  4. The aortic valve is bicuspid with focal calcification on the left  coronary cusp. Aortic valve regurgitation is mild. Mild to moderate aortic  stenosis is present.  5. The inferior vena cava is normal in size with greater than 50%  respiratory variability, suggesting right atrial pressure of 3 mmHg.    Recent Labs: 04/23/2020: ALT 8; BUN 21; Creatinine, Ser 1.08; Hemoglobin 12.9; Platelets 183; Potassium 4.5; Sodium 139; TSH 5.140  Recent Lipid Panel    Component Value Date/Time   CHOL 147 04/23/2020 1042   TRIG 64 04/23/2020 1042   HDL 63 04/23/2020 1042   CHOLHDL 2.3 04/23/2020 1042   LDLCALC 71 04/23/2020 1042    Physical Exam:    VS:  BP (!) 118/52   Pulse 86   Ht 5\' 3"  (1.6 m)   Wt 116 lb 12.8 oz (53 kg)   SpO2 94%   BMI 20.69 kg/m     Wt Readings from Last 3 Encounters:  07/24/20 116 lb 12.8 oz (53 kg)  07/24/20 112 lb (50.8 kg)  04/23/20 116 lb 6.4 oz (52.8 kg)     GEN: Patient is in no acute distress HEENT: Normal NECK: No JVD; No carotid bruits LYMPHATICS: No lymphadenopathy CARDIAC: Hear sounds regular, 2/6  systolic murmur at the apex. RESPIRATORY:  Clear to auscultation without rales, wheezing or rhonchi  ABDOMEN: Soft, non-tender, non-distended MUSCULOSKELETAL:  No edema; No deformity  SKIN: Warm and dry NEUROLOGIC:  Alert and oriented x 3 PSYCHIATRIC:  Normal affect   Signed, Lucas Lindau, MD  07/24/2020 10:23 AM    [place]

## 2020-07-24 NOTE — Progress Notes (Signed)
Subjective:  Patient ID: Lucas Kline, adult    DOB: 1929/02/10  Age: 86 y.o. MRN: [id number]  Chief Complaint  Patient presents with   Hypothyroidism   Hypertension    HPI: Chronic visit: he split wood yesterday  Patient presents for follow up of hypertension.  Patient tolerating almlodipine, verapamil well with side effects.  Patient was diagnosed with hypertension 2010 so has been treated for hypertension for 10 years.Patient is working on maintaining diet and exercise regimen and follows up as directed. Complication include none.  Patient has HYPOTHYROIDISM.  Diagnosed 10 years ago.  Patient has stable thyroid readings.  Patient is having no symptoms.  Last TSH was normal.  continue dosage of thyroid medicine.  He has some instability and has fallen at home.  no injuries.  Patient has atrial fibrillation, controlled , needs to be on asa to help prevent stroke.  He was felt to be too fragile for full anticoagulation  CORONARY ARTERY DISEASE  Patient presents in follow up of CAD. Patient was diagnosed in 2010. The patient has no associated CHF. The patient is currently taking a beta blocker, statin, and aspirin. CAD was diagnosed 10 years ago.  Patient is having no  angina. Patient has used no NTG.  Patient is followed by cardiology.  Patient had none . Last angiography was na, last echocardiogram na.   Current Outpatient Medications on File Prior to Visit  Medication Sig Dispense Refill   cyanocobalamin (,VITAMIN B-12,) 1000 MCG/ML injection INJECT 1 ML (1,000 MCG TOTAL) INTO THE MUSCLE EVERY 30 (THIRTY) DAYS. 3 mL 6   levothyroxine (SYNTHROID) 50 MCG tablet Take 1 tablet (50 mcg total) by mouth daily. 90 tablet 3   memantine (NAMENDA) 5 MG tablet TAKE 1 TABLET BY MOUTH TWICE A DAY 180 tablet 2   verapamil (CALAN-SR) 240 MG CR tablet TAKE 1 TABLET BY MOUTH EVERY DAY 90 tablet 2   simvastatin (ZOCOR) 20 MG tablet Take 1 tablet (20 mg total) by mouth daily. 90 tablet 3     No current facility-administered medications on file prior to visit.   Past Medical History:  Diagnosis Date   Abnormal EKG 01/28/2020   Adult BMI <19 kg/sq m 01/21/2020   Alzheimer disease ([place])    Atherosclerotic heart disease of native coronary artery without angina pectoris    CHRONIC    B12 deficiency    CHRONIC   Bilateral carotid bruits 01/28/2020   Chronic atrial fibrillation ([place]) 01/21/2020   CKD (chronic kidney disease), stage II    MILD   CKD (chronic kidney disease), stage III (HCC)    MODERATE (CHRONIC)   Coronary atherosclerosis    CHRONIC   Essential hypertension 01/28/2020   Essential hypertension, benign    Essential tremor    CHRONIC   Malnutrition of moderate degree ([place]) 01/21/2020   Mixed hearing loss, bilateral    CHRONIC   Mixed hyperlipidemia    CHRONIC    Obstructive chronic bronchitis without exacerbation ([place]) 01/21/2020   Osteoarthritis    OF KNEE CHRONIC   Personal history of malignant neoplasm of larynx 01/18/2020   Primary hypothyroidism 01/22/2020   Unspecified arthropathy, lower leg    Unspecified constipation    CHRONIC   Unstable gait 01/21/2020   Past Surgical History:  Procedure Laterality Date   CATARACT EXTRACTION, BILATERAL      Family History  Problem Relation Age of Onset   Hypertension Neg Hx    Heart disease Neg Hx  Cancer Neg Hx    Diabetes Neg Hx    Social History   Socioeconomic History   Marital status: Widowed    Spouse name: Not on file   Number of children: 2   Years of education: Not on file   Highest education level: Not on file  Occupational History   Occupation: hosiery mill   Occupation: Copywriter, advertising  Tobacco Use   Smoking status: Former Smoker    Types: Cigarettes    Quit date: 2008    Years since quitting: 13.8   Smokeless tobacco: Current User    Types: Chew   Tobacco comment: 1 pouch sometimes  Vaping Use   Vaping Use: Never used  Substance and Sexual  Activity   Alcohol use: No   Drug use: No   Sexual activity: Not Currently  Other Topics Concern   Not on file  Social History Narrative   Not on file   Social Determinants of Health   Financial Resource Strain:    Difficulty of Paying Living Expenses: Not on file  Food Insecurity:    Worried About Charity fundraiser in the Last Year: Not on file   [employer] of Food in the Last Year: Not on file  Transportation Needs:    Lack of Transportation (Medical): Not on file   Lack of Transportation (Non-Medical): Not on file  Physical Activity:    Days of Exercise per Week: Not on file   Minutes of Exercise per Session: Not on file  Stress:    Feeling of Stress : Not on file  Social Connections:    Frequency of Communication with Friends and Family: Not on file   Frequency of Social Gatherings with Friends and Family: Not on file   Attends Religious Services: Not on file   Active Member of Clubs or Organizations: Not on file   Attends Archivist Meetings: Not on file   Marital Status: Not on file    Review of Systems  Constitutional: Negative for activity change, appetite change and fatigue.  HENT: Positive for postnasal drip, rhinorrhea and sinus pain.   Eyes: Negative.   Respiratory: Negative for cough and shortness of breath.   Cardiovascular: Negative for chest pain, palpitations and leg swelling.  Gastrointestinal: Positive for constipation.  Endocrine: Negative.   Genitourinary: Negative.  Negative for difficulty urinating.  Musculoskeletal: Negative.   Skin: Negative.   Neurological: Positive for weakness.  Psychiatric/Behavioral: Negative.      Objective:  BP (!) 110/50    Pulse 81    Temp 98.1 F (36.7 C)    Resp 16    Ht 5' 6.54" (1.69 m)    Wt 112 lb (50.8 kg)    SpO2 93%    BMI 17.79 kg/m   BP/Weight 07/24/2020 07/24/2020 6/44/0347  Systolic BP 425 956 387  Diastolic BP 50 52 60  Wt. (Lbs) 112 116.8 116.4  BMI 17.79 20.69 18.27     Physical Exam Vitals reviewed.  Constitutional:      Appearance: Normal appearance.  HENT:     Right Ear: Tympanic membrane and external ear normal.     Left Ear: Ear canal and external ear normal.     Mouth/Throat:     Mouth: Mucous membranes are moist.     Pharynx: Oropharynx is clear.  Eyes:     Conjunctiva/sclera: Conjunctivae normal.     Pupils: Pupils are equal, round, and reactive to light.  Cardiovascular:     Rate  and Rhythm: Normal rate. Rhythm irregular.     Pulses: Normal pulses.     Heart sounds: Normal heart sounds.  Pulmonary:     Effort: Pulmonary effort is normal.     Breath sounds: Normal breath sounds.  Abdominal:     General: Abdomen is flat. Bowel sounds are normal.     Palpations: Abdomen is soft.  Musculoskeletal:        General: Normal range of motion.     Cervical back: Normal range of motion and neck supple.     Comments: Gait is slow and short steps  Skin:    General: Skin is warm.     Capillary Refill: Capillary refill takes less than 2 seconds.  Neurological:     General: No focal deficit present.     Mental Status: Blanchie Dessert Dingus "R[place]mark" is alert. Mental status is at baseline.     Motor: Weakness (legs) present.     Comments: Unstable rhomberg       Lab Results  Component Value Date   WBC 5.4 04/23/2020   HGB 12.9 (L) 04/23/2020   HCT 38.8 04/23/2020   PLT 183 04/23/2020   GLUCOSE 85 04/23/2020   CHOL 147 04/23/2020   TRIG 64 04/23/2020   HDL 63 04/23/2020   LDLCALC 71 04/23/2020   ALT 8 04/23/2020   AST 11 04/23/2020   NA 139 04/23/2020   K 4.5 04/23/2020   CL 105 04/23/2020   CREATININE 1.08 04/23/2020   BUN 21 04/23/2020   CO2 21 04/23/2020   TSH 5.140 (H) 04/23/2020      Assessment & Plan:   1. Primary hypothyroidism Patient is known to have hypothyroidism and is on treatment with levothyroxine 50 mcg.  Patient was diagnosed 10 years ago.  Other treatment includes none.  Patient is compliant with medicines  and last TSH 6 months ago.  Last TSH was normal.  2. Alzheimer's dementia without behavioral disturbance, unspecified timing of dementia onset ([place]) Patient's dementia is stable MMSE remains 23.  He remains independent withut supervision for medicines.  An individual plan was formulated for this patient based on patient history and physical exam, x-rays an other tests.  Evidence based decisions made.  3. Essential hypertension, benign An individual hypertension care plan was established and reinforced today.  The patient's status was assessed using clinical findings on exam and labs or diagnostic tests. The patient's success at meeting treatment goals on disease specific evidence-based guidelines and found to be well controlled. SELF MANAGEMENT: The patient and I together assessed ways to personally work towards obtaining the recommended goals. RECOMMENDATIONS: avoid decongestants found in common cold remedies, decrease consumption of alcohol, perform routine monitoring of BP with home BP cuff, exercise, reduction of dietary salt, take medicines as prescribed, try not to miss doses and quit smoking.  Regular exercise and maintaining a healthy weight is needed.  Stress reduction may help. A CLINICAL SUMMARY including written plan identify barriers to care unique to individual due to social or financial issues.  We attempt to mutually creat solutions for individual and family understanding.  4. Mixed hyperlipidemia AN INDIVIDUAL CARE PLAN for hyperlipidemia/ cholesterol was established and reinforced today.  The patient's status was assessed using clinical findings on exam, lab and other diagnostic tests. The patient's disease status was assessed based on evidence-based guidelines and found to be well controlled. MEDICATIONS were reviewed. SELF MANAGEMENT GOALS have been discussed and patient's success at attaining the goal of low cholesterol was assessed.  RECOMMENDATION given include regular exercise 3  days a week and low cholesterol/low fat diet. CLINICAL SUMMARY including written plan to identify barriers unique to the patient due to social or economic  reasons was discussed.  5. Atherosclerosis of native coronary artery of native heart without angina pectoris Patient's CAD was assessed using history and physical along with other information to maximize treatment.  Evidence based criteria was use in deciding proper management for this disease process.  Patient's CAD is under good control.therapy continue present medicines..  6. Moderate protein-calorie malnutrition ([place]) Supplement nutrition with protein/calorie supplement with meals to improve nutritional status.  7. Mixed conductive and sensorineural hearing loss of both ears  hearing loss is is to get hearing evaluation soon.  8. Chronic atrial fibrillation [facility]) Patient has a diagnosis of permanent atrial fibrillation.   Patient is on aspirin and has controlled ventricular response.  Patient is CV stable.        30 minute visit and discussion Follow-up: Return in about 3 months (around 10/24/2020), or needs Awe.  An After Visit Summary was printed and given to the patient.  [place] [phone]

## 2020-07-24 NOTE — Addendum Note (Signed)
Addended by: Albie Arizpe, Jonelle Sidle L on: 07/24/2020 01:23 PM   Modules accepted: Orders

## 2020-07-24 NOTE — Patient Instructions (Signed)
Recommend meals on wheels Take ensure 2 cans a day I recommended PT for strengthening but he declined He eats with  His friends in [place] in AM

## 2020-07-25 LAB — COMPREHENSIVE METABOLIC PANEL
ALT: 8 IU/L (ref 0–44)
AST: 15 IU/L (ref 0–40)
Albumin/Globulin Ratio: 2.1 (ref 1.2–2.2)
Albumin: 4.2 g/dL (ref 3.6–4.6)
Alkaline Phosphatase: 56 IU/L (ref 44–121)
BUN/Creatinine Ratio: 19 (ref 10–24)
BUN: 23 mg/dL (ref 8–27)
Bilirubin Total: 0.6 mg/dL (ref 0.0–1.2)
CO2: 22 mmol/L (ref 20–29)
Calcium: 9.3 mg/dL (ref 8.6–10.2)
Chloride: 102 mmol/L (ref 96–106)
Creatinine, Ser: 1.22 mg/dL (ref 0.76–1.27)
GFR calc Af Amer: 60 mL/min/{1.73_m2} (ref >59)
GFR calc non Af Amer: 52 mL/min/{1.73_m2} — ABNORMAL LOW (ref >59)
Globulin, Total: 2 g/dL (ref 1.5–4.5)
Glucose: 74 mg/dL (ref 65–99)
Potassium: 4 mmol/L (ref 3.5–5.2)
Sodium: 140 mmol/L (ref 134–144)
Total Protein: 6.2 g/dL (ref 6.0–8.5)

## 2020-07-25 LAB — CBC WITH DIFFERENTIAL/PLATELET
Basophils Absolute: 0 10*3/uL (ref 0.0–0.2)
Basos: 1 %
EOS (ABSOLUTE): 0.2 10*3/uL (ref 0.0–0.4)
Eos: 4 %
Hematocrit: 34.9 % — ABNORMAL LOW (ref 37.5–51.0)
Hemoglobin: 12 g/dL — ABNORMAL LOW (ref 13.0–17.7)
Immature Grans (Abs): 0 10*3/uL (ref 0.0–0.1)
Immature Granulocytes: 0 %
Lymphocytes Absolute: 1.1 10*3/uL (ref 0.7–3.1)
Lymphs: 24 %
MCH: 32.1 pg (ref 26.6–33.0)
MCHC: 34.4 g/dL (ref 31.5–35.7)
MCV: 93 fL (ref 79–97)
Monocytes Absolute: 0.6 10*3/uL (ref 0.1–0.9)
Monocytes: 12 %
Neutrophils Absolute: 2.8 10*3/uL (ref 1.4–7.0)
Neutrophils: 59 %
Platelets: 178 10*3/uL (ref 150–450)
RBC: 3.74 x10E6/uL — ABNORMAL LOW (ref 4.14–5.80)
RDW: 12.9 % (ref 11.6–15.4)
WBC: 4.8 10*3/uL (ref 3.4–10.8)

## 2020-07-25 LAB — LIPID PANEL
Chol/HDL Ratio: 2.2 ratio (ref 0.0–5.0)
Cholesterol, Total: 145 mg/dL (ref 100–199)
HDL: 66 mg/dL (ref >39)
LDL Chol Calc (NIH): 67 mg/dL (ref 0–99)
Triglycerides: 55 mg/dL (ref 0–149)
VLDL Cholesterol Cal: 12 mg/dL (ref 5–40)

## 2020-07-25 LAB — CARDIOVASCULAR RISK ASSESSMENT

## 2020-07-25 LAB — TSH: TSH: 1.63 u[IU]/mL (ref 0.450–4.500)

## 2020-07-25 NOTE — Progress Notes (Signed)
Anemia stable, no changes in kidney tests, liver tests normal, Cholesterol normal, TSH 1.6 normal lp

## 2020-07-28 ENCOUNTER — Ambulatory Visit: Payer: Medicare Other | Admitting: Legal Medicine

## 2020-08-13 DIAGNOSIS — R001 Bradycardia, unspecified: Secondary | ICD-10-CM | POA: Diagnosis not present

## 2020-09-08 ENCOUNTER — Other Ambulatory Visit: Payer: Self-pay

## 2020-09-08 ENCOUNTER — Ambulatory Visit (INDEPENDENT_AMBULATORY_CARE_PROVIDER_SITE_OTHER): Payer: Medicare Other | Admitting: Legal Medicine

## 2020-09-08 ENCOUNTER — Encounter: Payer: Self-pay | Admitting: Legal Medicine

## 2020-09-08 DIAGNOSIS — Z0000 Encounter for general adult medical examination without abnormal findings: Secondary | ICD-10-CM | POA: Insufficient documentation

## 2020-09-08 DIAGNOSIS — R413 Other amnesia: Secondary | ICD-10-CM

## 2020-09-08 HISTORY — DX: Other amnesia: R41.3

## 2020-09-08 HISTORY — DX: Encounter for general adult medical examination without abnormal findings: Z00.00

## 2020-09-08 NOTE — Progress Notes (Signed)
Subjective:  Patient ID: Lucas Kline, adult    DOB: Sep 09, 1929  Age: 86 y.o. MRN: [id number]  Chief Complaint  Patient presents with  . Annual Exam    AWV    HPI Encounter for general adult medical examination without abnormal findings  Physical ("At Risk" items are starred): Patient's last physical exam was 1 year ago .   Growth percentile SmartLinks can only be used for patients less than 89 years old.   SDOH Screenings   Alcohol Screen: Low Risk   . Last Alcohol Screening Score (AUDIT): 0  Depression (PHQ2-9): Low Risk   . PHQ-2 Score: 0  Financial Resource Strain: Low Risk   . Difficulty of Paying Living Expenses: Not hard at all  Food Insecurity: No Food Insecurity  . Worried About Charity fundraiser in the Last Year: Never true  . Ran Out of Food in the Last Year: Never true  Housing: Low Risk   . Last Housing Risk Score: 0  Physical Activity: Insufficiently Active  . Days of Exercise per Week: 2 days  . Minutes of Exercise per Session: 60 min  Social Connections: Moderately Isolated  . Frequency of Communication with Friends and Family: Once a week  . Frequency of Social Gatherings with Friends and Family: More than three times a week  . Attends Religious Services: Never  . Active Member of Clubs or Organizations: No  . Attends Archivist Meetings: More than 4 times per year  . Marital Status: Widowed  Stress: No Stress Concern Present  . Feeling of Stress : Not at all  Tobacco Use: High Risk  . Smoking Tobacco Use: Former Smoker  . Smokeless Tobacco Use: Current User  Transportation Needs: No Transportation Needs  . Lack of Transportation (Medical): No  . Lack of Transportation (Non-Medical): No    Fall Risk  09/08/2020 07/24/2020 04/23/2020 01/21/2020  Falls in the past year? 0 1 0 -  Number falls in past yr: 0 1 0 0  Injury with Fall? 0 0 0 0  Risk for fall due to : Impaired mobility;Impaired balance/gait - - Impaired balance/gait   Follow up - Falls evaluation completed Falls evaluation completed Falls evaluation completed    Depression screen [facility] 2/9 09/08/2020 01/21/2020 01/21/2020  Decreased Interest 0 0 0  Down, Depressed, Hopeless 0 0 0  PHQ - 2 Score 0 0 0    Functional Status Survey: Is the patient deaf or have difficulty hearing?: Yes Does the patient have difficulty seeing, even when wearing glasses/contacts?: No Does the patient have difficulty concentrating, remembering, or making decisions?: No Does the patient have difficulty walking or climbing stairs?: No Does the patient have difficulty dressing or bathing?: No Does the patient have difficulty doing errands alone such as visiting a doctor's office or shopping?: No   Safety: reviewed ;  Patient wears a seat belt. Patient's home has smoke detectors and carbon monoxide detectors. Patient practices appropriate gun safety Patient wears sunscreen with extended sun exposure. Dental Care: biannual cleanings, brushes and flosses daily. Ophthalmology/Optometry: Annual visit.  Hearing loss: none Vision impairments: none Patient is not afflicted from Stress Incontinence and Urge Incontinence   Current Outpatient Medications on File Prior to Visit  Medication Sig Dispense Refill  . amLODipine (NORVASC) 5 MG tablet Take 5 mg by mouth daily.    . cyanocobalamin (,VITAMIN B-12,) 1000 MCG/ML injection INJECT 1 ML (1,000 MCG TOTAL) INTO THE MUSCLE EVERY 30 (THIRTY) DAYS. 3 mL 6  .  levothyroxine (SYNTHROID) 50 MCG tablet Take 1 tablet (50 mcg total) by mouth daily. 90 tablet 3  . memantine (NAMENDA) 5 MG tablet TAKE 1 TABLET BY MOUTH TWICE A DAY 180 tablet 2  . simvastatin (ZOCOR) 20 MG tablet Take 1 tablet (20 mg total) by mouth daily. 90 tablet 3  . verapamil (CALAN-SR) 240 MG CR tablet TAKE 1 TABLET BY MOUTH EVERY DAY 90 tablet 2   No current facility-administered medications on file prior to visit.    Social Hx   Social History   Socioeconomic History   . Marital status: Widowed    Spouse name: Not on file  . Number of children: 2  . Years of education: Not on file  . Highest education level: Not on file  Occupational History  . Occupation: retired  . Occupation: Warehouse manager  Tobacco Use  . Smoking status: Former Smoker    Types: Cigarettes    Quit date: 2008    Years since quitting: 13.9  . Smokeless tobacco: Current User    Types: Chew  . Tobacco comment: 1 pouch sometimes  Vaping Use  . Vaping Use: Never used  Substance and Sexual Activity  . Alcohol use: No  . Drug use: No  . Sexual activity: Yes  Other Topics Concern  . Not on file  Social History Narrative  . Not on file   Social Determinants of Health   Financial Resource Strain: Low Risk   . Difficulty of Paying Living Expenses: Not hard at all  Food Insecurity: No Food Insecurity  . Worried About Programme researcher, broadcasting/film/video in the Last Year: Never true  . Ran Out of Food in the Last Year: Never true  Transportation Needs: No Transportation Needs  . Lack of Transportation (Medical): No  . Lack of Transportation (Non-Medical): No  Physical Activity: Insufficiently Active  . Days of Exercise per Week: 2 days  . Minutes of Exercise per Session: 60 min  Stress: No Stress Concern Present  . Feeling of Stress : Not at all  Social Connections: Moderately Isolated  . Frequency of Communication with Friends and Family: Once a week  . Frequency of Social Gatherings with Friends and Family: More than three times a week  . Attends Religious Services: Never  . Active Member of Clubs or Organizations: No  . Attends Banker Meetings: More than 4 times per year  . Marital Status: Widowed   Past Medical History:  Diagnosis Date  . Abnormal EKG 01/28/2020  . Adult BMI <19 kg/sq m 01/21/2020  . Alzheimer disease (HCC)   . Atherosclerotic heart disease of native coronary artery without angina pectoris    CHRONIC   . B12 deficiency    CHRONIC  . Bilateral  carotid bruits 01/28/2020  . Chronic atrial fibrillation (HCC) 01/21/2020  . CKD (chronic kidney disease), stage II    MILD  . CKD (chronic kidney disease), stage III (HCC)    MODERATE (CHRONIC)  . Coronary atherosclerosis    CHRONIC  . Essential hypertension 01/28/2020  . Essential hypertension, benign   . Essential tremor    CHRONIC  . Malnutrition of moderate degree (HCC) 01/21/2020  . Mixed hearing loss, bilateral    CHRONIC  . Mixed hyperlipidemia    CHRONIC   . Obstructive chronic bronchitis without exacerbation (HCC) 01/21/2020  . Osteoarthritis    OF KNEE CHRONIC  . Personal history of malignant neoplasm of larynx 01/18/2020  . Primary hypothyroidism 01/22/2020  . Unspecified arthropathy,  lower leg   . Unspecified constipation    CHRONIC  . Unstable gait 01/21/2020   Family History  Problem Relation Age of Onset  . Hypertension Neg Hx   . Heart disease Neg Hx   . Cancer Neg Hx   . Diabetes Neg Hx     Review of Systems  Constitutional: Positive for activity change. Negative for chills, fatigue and fever.  HENT: Negative for congestion, sinus pressure and sinus pain.   Eyes: Negative for visual disturbance.  Cardiovascular: Negative for chest pain, palpitations and leg swelling.  Gastrointestinal: Negative for abdominal distention and abdominal pain.  Endocrine: Negative for polyuria.  Genitourinary: Positive for difficulty urinating. Negative for dyspareunia and urgency.       Incontinence  Musculoskeletal: Positive for arthralgias.  Neurological: Positive for dizziness. Negative for syncope, numbness and headaches.  Psychiatric/Behavioral: Positive for decreased concentration.     Objective:  BP 100/60   Pulse 79   Temp 98 F (36.7 C)   Resp 16   Ht 5\' 3"  (1.6 m)   Wt 116 lb (52.6 kg)   SpO2 95%   BMI 20.55 kg/m   BP/Weight 09/08/2020 07/24/2020 [id number]  Systolic BP 123XX123 [id number]  Diastolic BP 60 50 52  Wt. (Lbs) 116 112 116.8  BMI 20.55 17.79 20.69     Physical Exam vs reviewed  Lab Results  Component Value Date   WBC 4.8 07/24/2020   HGB 12.0 (L) 07/24/2020   HCT 34.9 (L) 07/24/2020   PLT 178 07/24/2020   GLUCOSE 74 07/24/2020   CHOL 145 07/24/2020   TRIG 55 07/24/2020   HDL 66 07/24/2020   LDLCALC 67 07/24/2020   ALT 8 07/24/2020   AST 15 07/24/2020   NA 140 07/24/2020   K 4.0 07/24/2020   CL 102 07/24/2020   CREATININE 1.22 07/24/2020   BUN 23 07/24/2020   CO2 22 07/24/2020   TSH 1.630 07/24/2020      Assessment & Plan:  1. Memory deficit pATIENT HAS poor memory and refused MMSE, he is on namenda.  2. Routine general medical examination at a health care facility Wellness physical performed and immunizations reviewed      These are the goals we discussed: Goals    . Have 3 meals a day        This is a list of the screening recommended for you and due dates:  Health Maintenance  Topic Date Due  . DEXA scan (bone density measurement)  Never done  . COVID-19 Vaccine (1) 09/24/2020*  . Flu Shot  12/11/2020*  . Pneumonia vaccines (2 of 2 - PPSV23) 09/08/2021*  . Tetanus Vaccine  12/28/2021  *Topic was postponed. The date shown is not the original due date.      AN INDIVIDUALIZED CARE PLAN: was established or reinforced today.   SELF MANAGEMENT: The patient and I together assessed ways to personally work towards obtaining the recommended goals  Support needs The patient and/or family needs were assessed and services were offered and not necessary at this time.    Follow-up: Return as scheduled.  Reinaldo Meeker, MD [facility] [phone]

## 2020-10-20 ENCOUNTER — Telehealth: Payer: Self-pay

## 2020-10-20 NOTE — Telephone Encounter (Signed)
Daughter calls states pt is stumbling and falling. EMT was called when he hit his head and he refused to go. He did something to L leg but they do not know what. Doreene Adas, granddaughter on [place], is coming with pt on Thursday. Mendel Ryder will be coming to lobby but not to room with pt unless pt allows. A cane was bought but it is too heavy. He does not eat much but states he eats when he is hungry. Pt refuses to allow anyone back with him while in office. After fall on Sunday he could not say daughter's name but on Monday he could. Daughter would like MMS done. Pt lives with Son.  Daughter wants message sent to PCP as FYI for Thursday.

## 2020-10-21 DIAGNOSIS — I10 Essential (primary) hypertension: Secondary | ICD-10-CM | POA: Insufficient documentation

## 2020-10-23 ENCOUNTER — Encounter: Payer: Self-pay | Admitting: Cardiology

## 2020-10-23 ENCOUNTER — Ambulatory Visit (INDEPENDENT_AMBULATORY_CARE_PROVIDER_SITE_OTHER): Payer: Medicare Other | Admitting: Legal Medicine

## 2020-10-23 ENCOUNTER — Other Ambulatory Visit: Payer: Self-pay

## 2020-10-23 ENCOUNTER — Ambulatory Visit: Payer: Medicare Other | Admitting: Cardiology

## 2020-10-23 ENCOUNTER — Encounter: Payer: Self-pay | Admitting: Legal Medicine

## 2020-10-23 ENCOUNTER — Telehealth: Payer: Self-pay | Admitting: Cardiology

## 2020-10-23 VITALS — BP 104/60 | HR 78 | Ht 66.0 in | Wt 112.4 lb

## 2020-10-23 VITALS — BP 100/62 | HR 77 | Temp 97.4°F | Resp 18 | Ht 63.0 in | Wt 118.0 lb

## 2020-10-23 DIAGNOSIS — E440 Moderate protein-calorie malnutrition: Secondary | ICD-10-CM

## 2020-10-23 DIAGNOSIS — I10 Essential (primary) hypertension: Secondary | ICD-10-CM | POA: Diagnosis not present

## 2020-10-23 DIAGNOSIS — I2510 Atherosclerotic heart disease of native coronary artery without angina pectoris: Secondary | ICD-10-CM | POA: Diagnosis not present

## 2020-10-23 DIAGNOSIS — J449 Chronic obstructive pulmonary disease, unspecified: Secondary | ICD-10-CM | POA: Diagnosis not present

## 2020-10-23 DIAGNOSIS — G309 Alzheimer's disease, unspecified: Secondary | ICD-10-CM | POA: Diagnosis not present

## 2020-10-23 DIAGNOSIS — E782 Mixed hyperlipidemia: Secondary | ICD-10-CM

## 2020-10-23 DIAGNOSIS — N183 Chronic kidney disease, stage 3 unspecified: Secondary | ICD-10-CM

## 2020-10-23 DIAGNOSIS — N1831 Chronic kidney disease, stage 3a: Secondary | ICD-10-CM

## 2020-10-23 DIAGNOSIS — E039 Hypothyroidism, unspecified: Secondary | ICD-10-CM

## 2020-10-23 DIAGNOSIS — I482 Chronic atrial fibrillation, unspecified: Secondary | ICD-10-CM

## 2020-10-23 DIAGNOSIS — F0280 Dementia in other diseases classified elsewhere without behavioral disturbance: Secondary | ICD-10-CM

## 2020-10-23 DIAGNOSIS — H906 Mixed conductive and sensorineural hearing loss, bilateral: Secondary | ICD-10-CM

## 2020-10-23 MED ORDER — LEVOTHYROXINE SODIUM 50 MCG PO TABS: 50.0000 ug | ORAL_TABLET | Freq: Every day | ORAL | 3 refills | Status: DC

## 2020-10-23 MED ORDER — CYANOCOBALAMIN 1000 MCG/ML IJ SOLN: 1000.0000 ug | INTRAMUSCULAR | 6 refills | Status: DC

## 2020-10-23 MED ORDER — MEMANTINE HCL 5 MG PO TABS: 5.0000 mg | ORAL_TABLET | Freq: Two times a day (BID) | ORAL | 2 refills | Status: DC

## 2020-10-23 NOTE — Progress Notes (Signed)
Subjective:  Patient ID: Lucas Kline, adult    DOB: 07-24-1929  Age: 87 y.o. MRN: [id number]  Chief Complaint  Patient presents with  . Hyperlipidemia  . Hypertension  . Hypothyroidism    HPI: chronic visit  Patient presents for follow up of hypertension.  Patient tolerating verapamil well with side effects.  Patient was diagnosed with hypertension 2010 so has been treated for hypertension for 10 years.Patient is working on maintaining diet and exercise regimen and follows up as directed. Complication include none.  Patient presents with hyperlipidemia.  Compliance with treatment has been good; patient takes medicines as directed, maintains low cholesterol diet, follows up as directed, and maintains exercise regimen.  Patient is using imvastatin without problems.  Memory doing poorly, he is having falls.  Needs to use cane or walker   Current Outpatient Medications on File Prior to Visit  Medication Sig Dispense Refill  . verapamil (CALAN-SR) 240 MG CR tablet TAKE 1 TABLET BY MOUTH EVERY DAY 90 tablet 2   No current facility-administered medications on file prior to visit.   Past Medical History:  Diagnosis Date  . Abnormal EKG 01/28/2020  . Adult BMI <19 kg/sq m 01/21/2020  . Alzheimer disease ([place])   . Atherosclerotic heart disease of native coronary artery without angina pectoris    CHRONIC   . B12 deficiency    CHRONIC  . Bilateral carotid bruits 01/28/2020  . Chronic atrial fibrillation ([place]) 01/21/2020  . CKD (chronic kidney disease), stage II    MILD  . CKD (chronic kidney disease), stage III (HCC)    MODERATE (CHRONIC)  . Coronary atherosclerosis    CHRONIC  . Essential hypertension 01/28/2020  . Essential hypertension, benign   . Essential tremor    CHRONIC  . Malnutrition of moderate degree ([place]) 01/21/2020  . Memory deficit 09/08/2020  . Mixed hearing loss, bilateral    CHRONIC  . Mixed hyperlipidemia    CHRONIC   . Obstructive chronic bronchitis  without exacerbation ([place]) 01/21/2020  . Osteoarthritis    OF KNEE CHRONIC  . Personal history of malignant neoplasm of larynx 01/18/2020  . Primary hypothyroidism 01/22/2020  . Routine general medical examination at a health care facility 09/08/2020  . Unspecified arthropathy, lower leg   . Unspecified constipation    CHRONIC  . Unstable gait 01/21/2020   Past Surgical History:  Procedure Laterality Date  . CATARACT EXTRACTION, BILATERAL      Family History  Problem Relation Age of Onset  . Hypertension Neg Hx   . Heart disease Neg Hx   . Cancer Neg Hx   . Diabetes Neg Hx    Social History   Socioeconomic History  . Marital status: Widowed    Spouse name: Not on file  . Number of children: 2  . Years of education: Not on file  . Highest education level: Not on file  Occupational History  . Occupation: retired  . Occupation: Copywriter, advertising  Tobacco Use  . Smoking status: Former Smoker    Types: Cigarettes    Quit date: 2008    Years since quitting: 14.1  . Smokeless tobacco: Current User    Types: Chew  . Tobacco comment: 1 pouch sometimes  Vaping Use  . Vaping Use: Never used  Substance and Sexual Activity  . Alcohol use: No  . Drug use: No  . Sexual activity: Yes  Other Topics Concern  . Not on file  Social History Narrative  . Not on file  Social Determinants of Health   Financial Resource Strain: Low Risk   . Difficulty of Paying Living Expenses: Not hard at all  Food Insecurity: No Food Insecurity  . Worried About Charity fundraiser in the Last Year: Never true  . Ran Out of Food in the Last Year: Never true  Transportation Needs: No Transportation Needs  . Lack of Transportation (Medical): No  . Lack of Transportation (Non-Medical): No  Physical Activity: Insufficiently Active  . Days of Exercise per Week: 2 days  . Minutes of Exercise per Session: 60 min  Stress: No Stress Concern Present  . Feeling of Stress : Not at all  Social Connections:  Moderately Isolated  . Frequency of Communication with Friends and Family: Once a week  . Frequency of Social Gatherings with Friends and Family: More than three times a week  . Attends Religious Services: Never  . Active Member of Clubs or Organizations: No  . Attends Archivist Meetings: More than 4 times per year  . Marital Status: Widowed    Review of Systems  Constitutional: Negative for activity change, appetite change and unexpected weight change.  HENT: Negative.  Negative for congestion and sinus pain.   Eyes: Negative for visual disturbance.  Respiratory: Negative for apnea, chest tightness and shortness of breath.   Cardiovascular: Negative for chest pain, palpitations and leg swelling.  Gastrointestinal: Negative for abdominal distention.  Genitourinary: Negative for difficulty urinating and dyspareunia.  Musculoskeletal: Negative for arthralgias and back pain.  Neurological: Positive for weakness.  Psychiatric/Behavioral: Positive for confusion.     Objective:  BP 100/62 (BP Location: Right Arm, Patient Position: Sitting, Cuff Size: Normal)   Pulse 77   Temp (!) 97.4 F (36.3 C) (Temporal)   Resp 18   Ht 5\' 3"  (1.6 m)   Wt 118 lb (53.5 kg)   SpO2 93%   BMI 20.90 kg/m   BP/Weight 10/23/2020 10/23/2020 96/78/9381  Systolic BP 017 510 258  Diastolic BP 62 60 60  Wt. (Lbs) 118 112.4 116  BMI 20.9 18.14 20.55    Physical Exam Vitals reviewed.  Constitutional:      Appearance: Normal appearance.  HENT:     Head: Normocephalic and atraumatic.     Right Ear: Tympanic membrane normal.     Left Ear: Tympanic membrane normal.     Nose: Nose normal.     Mouth/Throat:     Mouth: Mucous membranes are moist.     Pharynx: Oropharynx is clear.  Eyes:     Extraocular Movements: Extraocular movements intact.     Conjunctiva/sclera: Conjunctivae normal.     Pupils: Pupils are equal, round, and reactive to light.  Cardiovascular:     Rate and Rhythm: Normal  rate and regular rhythm.     Pulses: Normal pulses.     Heart sounds: Normal heart sounds. No murmur heard. No gallop.   Pulmonary:     Effort: Pulmonary effort is normal. No respiratory distress.     Breath sounds: Normal breath sounds. No rales.  Abdominal:     General: Abdomen is flat. Bowel sounds are normal.  Musculoskeletal:        General: Normal range of motion.  Skin:    General: Skin is warm.     Capillary Refill: Capillary refill takes less than 2 seconds.  Neurological:     General: No focal deficit present.     Mental Status: Lucas Dessert [place] "[place]" is alert. Mental status is at  baseline.     Coordination: Coordination abnormal.  Psychiatric:        Mood and Affect: Mood normal.    MMSE - Mini Mental State Exam 10/23/2020 09/08/2020 07/24/2020  Orientation to time 3 3 1   Orientation to Place 3 3 5   Registration 3 2 3   Attention/ Calculation 0 0 0  Recall 0 2 3  Language- name 2 objects 2 2 2   Language- repeat 1 1 1   Language- follow 3 step command 3 3 3   Language- read & follow direction 1 1 1   Write a sentence 1 1 1   Copy design 1 1 1   Total score 18 19 21     Lab Results  Component Value Date   WBC 4.8 07/24/2020   HGB 12.0 (L) 07/24/2020   HCT 34.9 (L) 07/24/2020   PLT 178 07/24/2020   GLUCOSE 74 07/24/2020   CHOL 145 07/24/2020   TRIG 55 07/24/2020   HDL 66 07/24/2020   LDLCALC 67 07/24/2020   ALT 8 07/24/2020   AST 15 07/24/2020   NA 140 07/24/2020   K 4.0 07/24/2020   CL 102 07/24/2020   CREATININE 1.22 07/24/2020   BUN 23 07/24/2020   CO2 22 07/24/2020   TSH 1.630 07/24/2020      Assessment & Plan:   Diagnoses and all orders for this visit: Primary hypothyroidism -     TSH Patient is known to have hypothyroidism and is on treatment with levothyroxine 11mcg.  Patient was diagnosed 10 years ago.  Other treatment includes non.  Patient is compliant with medicines and last TSH 6 months ago.  Last TSH was normal . Alzheimer's  dementia without behavioral disturbance, unspecified timing of dementia onset ([place]) -     AMB Referral to [place] -     Ambulatory referral to Physical Therapy Patient's dementia is stable MMSE remains 18/30.  He remains independent without supervision for medicines.  An individual plan was formulated for this patient based on patient history and physical exam, x-rays an other tests.  Evidence based decisions made. Patient needs support at home for medicines and physical therapy to prevent recurrent falls  Essential hypertension, benign -     CBC with Differential/Platelet -     Comprehensive metabolic panel An individual hypertension care plan was established and reinforced today.  The patient's status was assessed using clinical findings on exam and labs or diagnostic tests. The patient's success at meeting treatment goals on disease specific evidence-based guidelines and found to be well controlled. SELF MANAGEMENT: The patient and I together assessed ways to personally work towards obtaining the recommended goals. RECOMMENDATIONS: avoid decongestants found in common cold remedies, decrease consumption of alcohol, perform routine monitoring of BP with home BP cuff, exercise, reduction of dietary salt, take medicines as prescribed, try not to miss doses and quit smoking.  Regular exercise and maintaining a healthy weight is needed.  Stress reduction may help. A CLINICAL SUMMARY including written plan identify barriers to care unique to individual due to social or financial issues.  We attempt to mutually creat solutions for individual and family understanding. Mixed hyperlipidemia -     Lipid panel AN INDIVIDUAL CARE PLAN for hyperlipidemia/ cholesterol was established and reinforced today.  The patient's status was assessed using clinical findings on exam, lab and other diagnostic tests. The patient's disease status was assessed based on evidence-based guidelines and found to be  well controlled. MEDICATIONS were reviewed. SELF MANAGEMENT GOALS have been discussed and  patient's success at attaining the goal of low cholesterol was assessed. RECOMMENDATION given include regular exercise 3 days a week and low cholesterol/low fat diet. CLINICAL SUMMARY including written plan to identify barriers unique to the patient due to social or economic  reasons was discussed.  Atherosclerosis of native coronary artery of native heart without angina pectoris Patient's CAD was assessed using history and physical along with other information to maximize treatment.  Evidence based criteria was use in deciding proper management for this disease process.  Patient's CAD is under good control.therapy continue with present treatment . Moderate protein-calorie malnutrition ([employer]) Supplement nutrition with protein/calorie supplement with meals to improve nutritional status.  Mixed conductive and sensorineural hearing loss of both ears Patient needs hearing aids but refuses  Chronic atrial fibrillation [facility]) Patient has a diagnosis of permanent atrial fibrillation.   Patient is on no anticoagulant and has controlled ventricular response.  Patient is CV stable . Stage 3a chronic kidney disease ([place]) AN INDIVIDUAL CARE PLAN for renal failure was established and reinforced today.  The patient's status was assessed using clinical findings on exam, labs, and other diagnostic testing. Patient's success at meeting treatment goals based on disease specific evidence-bassed guidelines and found to be in fair control. RECOMMENDATIONS include maintaining present medicines and treatment.  Obstructive chronic bronchitis without exacerbation ([place]) An individualize plan was formulated for care of COPD.  Treatment is evidence based.  She will continue on inhalers, avoid smoking and smoke.  Regular exercise with help with dyspnea. Routine follow ups and medication compliance is needed. Other orders -      cyanocobalamin (,VITAMIN B-12,) 1000 MCG/ML injection; Inject 1 mL (1,000 mcg total) into the muscle every 30 (thirty) days.       I spent 45 minutes dedicated to the care of this patient on the date of this encounter to include face-to-face time with the patient, as well as: discuss home PT and restarting his medicines  Follow-up: Return in about 3 months (around 01/20/2021) for fasting.  An After Visit Summary was printed and given to the patient.  Reinaldo Meeker, MD [facility] [phone]

## 2020-10-23 NOTE — Telephone Encounter (Signed)
Patient's granddaughter states she is returning a call to [place]., but she is unsure what the call was regarding.

## 2020-10-23 NOTE — Patient Instructions (Signed)
Medication Instructions:  Your physician recommends that you continue on your current medications as directed. Please refer to the Current Medication list given to you today.  Will contact patient this afternoon to confirm medications. *If you need a refill on your cardiac medications before your next appointment, please call your pharmacy*   Lab Work: None If you have labs (blood work) drawn today and your tests are completely normal, you will receive your results only by: Marland Kitchen MyChart Message (if you have MyChart) OR . A paper copy in the mail If you have any lab test that is abnormal or we need to change your treatment, we will call you to review the results.   Testing/Procedures: None   Follow-Up: At [facility], you and your health needs are our priority.  As part of our continuing mission to provide you with exceptional heart care, we have created designated Provider Care Teams.  These Care Teams include your primary Cardiologist (physician) and Advanced Practice Providers (APPs -  Physician Assistants and Nurse Practitioners) who all work together to provide you with the care you need, when you need it.  We recommend signing up for the patient portal called "MyChart".  Sign up information is provided on this After Visit Summary.  MyChart is used to connect with patients for Virtual Visits (Telemedicine).  Patients are able to view lab/test results, encounter notes, upcoming appointments, etc.  Non-urgent messages can be sent to your provider as well.   To learn more about what you can do with MyChart, go to [email or web address].    Your next appointment:   6 month(s)  The format for your next appointment:   In Person  Provider:   Jyl Heinz, MD Jyl Heinz, MD   Other Instructions

## 2020-10-23 NOTE — Telephone Encounter (Signed)
Stop amlodipine and keep a track of pulse and blood pressure and send it to [place] in a week.

## 2020-10-23 NOTE — Telephone Encounter (Signed)
Recomendations reviewed with pt as per Dr. Julien Nordmann note.  Pt verbalized understanding and had no additional questions.

## 2020-10-23 NOTE — Progress Notes (Signed)
Cardiology Office Note:    Date:  10/23/2020   ID:  Lucas Kline, DOB Oct 04, 1928, MRN [id number]  PCP:  Lillard Anes, MD  Cardiologist:  Jenean Lindau, MD   Referring MD: Lillard Anes,*    ASSESSMENT:    1. Essential hypertension   2. Mixed hyperlipidemia   3. Stage 3 chronic kidney disease, unspecified whether stage 3a or 3b CKD ([place])    PLAN:    In order of problems listed above:  1. Primary prevention stressed with the patient.  Importance of compliance with diet medication stressed any vocalized understanding. 2. Essential hypertension: Blood pressure stable and diet was emphasized.  I told him to go home and give [place] a call about the medications he is taking and his granddaughter will help him with this. 3. Mixed dyslipidemia: Diet was emphasized patient.  Patient was on statin therapy.  Lipids followed by primary care physician. 4. Renal insufficiency: Stable and monitored by his primary care physician. 5. Once we get the list of his correct medications we will titrate and appropriately.  I would like to lower some of his blood pressure medications in view of falls and borderline blood pressure. 6. Patient will be seen in follow-up appointment in 6 months or earlier if the patient has any concerns    Medication Adjustments/Labs and Tests Ordered: Current medicines are reviewed at length with the patient today.  Concerns regarding medicines are outlined above.  No orders of the defined types were placed in this encounter.  No orders of the defined types were placed in this encounter.    No chief complaint on file.    History of Present Illness:    Lucas Kline is a 87 y.o. adult.  Patient has past medical history of essential hypertension dyslipidemia and renal insufficiency.  He denies any problems at this time and takes care of activities of daily living.  He has been having frequent falls.  He denies any dizziness or any  syncopal spells.  He is not clear about the medications he is taking at this time.  At the time of my evaluation, the patient is alert awake oriented and in no distress.  Past Medical History:  Diagnosis Date  . Abnormal EKG 01/28/2020  . Adult BMI <19 kg/sq m 01/21/2020  . Alzheimer disease ([place])   . Atherosclerotic heart disease of native coronary artery without angina pectoris    CHRONIC   . B12 deficiency    CHRONIC  . Bilateral carotid bruits 01/28/2020  . Chronic atrial fibrillation ([place]) 01/21/2020  . CKD (chronic kidney disease), stage II    MILD  . CKD (chronic kidney disease), stage III ([facility])    MODERATE (CHRONIC)  . Coronary atherosclerosis    CHRONIC  . Essential hypertension 01/28/2020  . Essential hypertension, benign   . Essential tremor    CHRONIC  . Malnutrition of moderate degree ([place]) 01/21/2020  . Memory deficit 09/08/2020  . Mixed hearing loss, bilateral    CHRONIC  . Mixed hyperlipidemia    CHRONIC   . Obstructive chronic bronchitis without exacerbation ([place]) 01/21/2020  . Osteoarthritis    OF KNEE CHRONIC  . Personal history of malignant neoplasm of larynx 01/18/2020  . Primary hypothyroidism 01/22/2020  . Routine general medical examination at a health care facility 09/08/2020  . Unspecified arthropathy, lower leg   . Unspecified constipation    CHRONIC  . Unstable gait 01/21/2020    Past Surgical History:  Procedure Laterality  Date  . CATARACT EXTRACTION, BILATERAL      Current Medications: Current Meds  Medication Sig  . cyanocobalamin (,VITAMIN B-12,) 1000 MCG/ML injection Inject 1 mL (1,000 mcg total) into the muscle every 30 (thirty) days.  . simvastatin (ZOCOR) 20 MG tablet Take 1 tablet (20 mg total) by mouth daily.  . verapamil (CALAN-SR) 240 MG CR tablet TAKE 1 TABLET BY MOUTH EVERY DAY     Allergies:   Aricept [donepezil]   Social History   Socioeconomic History  . Marital status: Widowed    Spouse name: Not on file  . Number of  children: 2  . Years of education: Not on file  . Highest education level: Not on file  Occupational History  . Occupation: retired  . Occupation: Copywriter, advertising  Tobacco Use  . Smoking status: Former Smoker    Types: Cigarettes    Quit date: 2008    Years since quitting: 14.1  . Smokeless tobacco: Current User    Types: Chew  . Tobacco comment: 1 pouch sometimes  Vaping Use  . Vaping Use: Never used  Substance and Sexual Activity  . Alcohol use: No  . Drug use: No  . Sexual activity: Yes  Other Topics Concern  . Not on file  Social History Narrative  . Not on file   Social Determinants of Health   Financial Resource Strain: Low Risk   . Difficulty of Paying Living Expenses: Not hard at all  Food Insecurity: No Food Insecurity  . Worried About Charity fundraiser in the Last Year: Never true  . Ran Out of Food in the Last Year: Never true  Transportation Needs: No Transportation Needs  . Lack of Transportation (Medical): No  . Lack of Transportation (Non-Medical): No  Physical Activity: Insufficiently Active  . Days of Exercise per Week: 2 days  . Minutes of Exercise per Session: 60 min  Stress: No Stress Concern Present  . Feeling of Stress : Not at all  Social Connections: Moderately Isolated  . Frequency of Communication with Friends and Family: Once a week  . Frequency of Social Gatherings with Friends and Family: More than three times a week  . Attends Religious Services: Never  . Active Member of Clubs or Organizations: No  . Attends Archivist Meetings: More than 4 times per year  . Marital Status: Widowed     Family History: The patient's family history is negative for Hypertension, Heart disease, Cancer, and Diabetes.  ROS:   Please see the history of present illness.    All other systems reviewed and are negative.  EKGs/Labs/Other Studies Reviewed:    The following studies were reviewed today: Lucas Kline, DOB 1928/10/05, MRN  [id number]  EVENT MONITOR REPORT:   Patient was monitored from 07/24/2020 to 08/03/2020. Indication:                    Palpitations Ordering physician:  Jenean Lindau, MD  Referring physician:        Jenean Lindau, MD    Baseline rhythm: Sinus  Minimum heart rate: 39 BPM.  Average heart rate: 74 BPM.  Maximal heart rate 135 BPM.  Atrial arrhythmia: Sinus rhythm with rare PACs and rare brief atrial runs  Ventricular arrhythmia: Occasional PVCs with ventricular bigeminy seen at times  Conduction abnormality: First-degree AV block and occasionally Mobitz type I Wenckebach  Symptoms: None significant   Conclusion:  Mildly abnormal but otherwise unremarkable event monitor.  Patient had a brief atrial runs and occasional asymptomatic Mobitz type I AV block.  Interpreting  cardiologist: Jenean Lindau, MD  Date: 08/13/2020 12:18 PM  IMPRESSIONS    1. Left ventricular ejection fraction, by estimation, is 55 to 60%. The  left ventricle has normal function. The left ventricle has no regional  wall motion abnormalities. Left ventricular diastolic parameters are  consistent with Grade I diastolic  dysfunction (impaired relaxation).  2. Right ventricular systolic function is normal. The right ventricular  size is normal. There is normal pulmonary artery systolic pressure.  3. The mitral valve is normal in structure. No evidence of mitral valve  regurgitation. No evidence of mitral stenosis.  4. The aortic valve is bicuspid with focal calcification on the left  coronary cusp. Aortic valve regurgitation is mild. Mild to moderate aortic  stenosis is present.  5. The inferior vena cava is normal in size with greater than 50%  respiratory variability, suggesting right atrial pressure of 3 mmHg.      Recent Labs: 07/24/2020: ALT 8; BUN 23; Creatinine, Ser 1.22; Hemoglobin 12.0; Platelets 178; Potassium 4.0; Sodium 140; TSH 1.630  Recent Lipid Panel     Component Value Date/Time   CHOL 145 07/24/2020 0840   TRIG 55 07/24/2020 0840   HDL 66 07/24/2020 0840   CHOLHDL 2.2 07/24/2020 0840   LDLCALC 67 07/24/2020 0840    Physical Exam:    VS:  BP 104/60   Pulse 78   Ht 5\' 6"  (1.676 m)   Wt 112 lb 6.4 oz (51 kg)   SpO2 93%   BMI 18.14 kg/m     Wt Readings from Last 3 Encounters:  10/23/20 112 lb 6.4 oz (51 kg)  10/23/20 118 lb (53.5 kg)  09/08/20 116 lb (52.6 kg)     GEN: Patient is in no acute distress HEENT: Normal NECK: No JVD; No carotid bruits LYMPHATICS: No lymphadenopathy CARDIAC: Hear sounds regular, 2/6 systolic murmur at the apex. RESPIRATORY:  Clear to auscultation without rales, wheezing or rhonchi  ABDOMEN: Soft, non-tender, non-distended MUSCULOSKELETAL:  No edema; No deformity  SKIN: Warm and dry NEUROLOGIC:  Alert and oriented x 3 PSYCHIATRIC:  Normal affect   Signed, Jenean Lindau, MD  10/23/2020 11:27 AM    [place]

## 2020-10-23 NOTE — Telephone Encounter (Signed)
Updated patient med list with his bottles at home since patient was unsure of his medications during his visit.

## 2020-10-24 ENCOUNTER — Telehealth: Payer: Self-pay | Admitting: Legal Medicine

## 2020-10-24 LAB — COMPREHENSIVE METABOLIC PANEL
ALT: 9 IU/L (ref 0–44)
AST: 16 IU/L (ref 0–40)
Albumin/Globulin Ratio: 1.6 (ref 1.2–2.2)
Albumin: 3.7 g/dL (ref 3.6–4.6)
Alkaline Phosphatase: 52 IU/L (ref 44–121)
BUN/Creatinine Ratio: 10 (ref 10–24)
BUN: 13 mg/dL (ref 8–27)
Bilirubin Total: 0.5 mg/dL (ref 0.0–1.2)
CO2: 21 mmol/L (ref 20–29)
Calcium: 8.6 mg/dL (ref 8.6–10.2)
Chloride: 102 mmol/L (ref 96–106)
Creatinine, Ser: 1.27 mg/dL (ref 0.76–1.27)
GFR calc Af Amer: 58 mL/min/{1.73_m2} — ABNORMAL LOW (ref >59)
GFR calc non Af Amer: 50 mL/min/{1.73_m2} — ABNORMAL LOW (ref >59)
Globulin, Total: 2.3 g/dL (ref 1.5–4.5)
Glucose: 100 mg/dL — ABNORMAL HIGH (ref 65–99)
Potassium: 3.9 mmol/L (ref 3.5–5.2)
Sodium: 140 mmol/L (ref 134–144)
Total Protein: 6 g/dL (ref 6.0–8.5)

## 2020-10-24 LAB — LIPID PANEL
Chol/HDL Ratio: 3.5 ratio (ref 0.0–5.0)
Cholesterol, Total: 152 mg/dL (ref 100–199)
HDL: 44 mg/dL (ref >39)
LDL Chol Calc (NIH): 92 mg/dL (ref 0–99)
Triglycerides: 83 mg/dL (ref 0–149)
VLDL Cholesterol Cal: 16 mg/dL (ref 5–40)

## 2020-10-24 LAB — CBC WITH DIFFERENTIAL/PLATELET
Basophils Absolute: 0 10*3/uL (ref 0.0–0.2)
Basos: 1 %
EOS (ABSOLUTE): 0.1 10*3/uL (ref 0.0–0.4)
Eos: 2 %
Hematocrit: 35.4 % — ABNORMAL LOW (ref 37.5–51.0)
Hemoglobin: 12.5 g/dL — ABNORMAL LOW (ref 13.0–17.7)
Immature Grans (Abs): 0 10*3/uL (ref 0.0–0.1)
Immature Granulocytes: 0 %
Lymphocytes Absolute: 0.7 10*3/uL (ref 0.7–3.1)
Lymphs: 19 %
MCH: 32.4 pg (ref 26.6–33.0)
MCHC: 35.3 g/dL (ref 31.5–35.7)
MCV: 92 fL (ref 79–97)
Monocytes Absolute: 0.4 10*3/uL (ref 0.1–0.9)
Monocytes: 10 %
Neutrophils Absolute: 2.5 10*3/uL (ref 1.4–7.0)
Neutrophils: 68 %
Platelets: 199 10*3/uL (ref 150–450)
RBC: 3.86 x10E6/uL — ABNORMAL LOW (ref 4.14–5.80)
RDW: 12.2 % (ref 11.6–15.4)
WBC: 3.7 10*3/uL (ref 3.4–10.8)

## 2020-10-24 LAB — TSH: TSH: 4.81 u[IU]/mL — ABNORMAL HIGH (ref 0.450–4.500)

## 2020-10-24 LAB — CARDIOVASCULAR RISK ASSESSMENT

## 2020-10-24 NOTE — Progress Notes (Signed)
Still chronic anemia, glucose 100, kidney tests remain stage 3a, liver tests normal, cholesterol normal, TSH high 4.8, I restarted his thyroid medicine lp

## 2020-10-24 NOTE — Progress Notes (Signed)
  Chronic Care Management   Outreach Note  10/24/2020 Name: Lucas Kline MRN: [id number] DOB: 1929-07-12  Referred by: Lillard Anes, MD Reason for referral : Chronic Care Management   An unsuccessful telephone outreach was attempted today. The patient was referred to the pharmacist for assistance with care management and care coordination.   Follow Up Plan:   Hilario Quarry  Upstream Scheduler

## 2020-10-27 ENCOUNTER — Ambulatory Visit: Payer: Medicare Other | Admitting: Legal Medicine

## 2020-10-29 ENCOUNTER — Telehealth: Payer: Self-pay | Admitting: Legal Medicine

## 2020-10-29 NOTE — Progress Notes (Signed)
°  Chronic Care Management   Outreach Note  10/29/2020 Name: Lucas Kline MRN: [id number] DOB: [birth year]  Referred by: Lillard Anes, MD Reason for referral : Chronic Care Management   A second unsuccessful telephone outreach was attempted today. The patient was referred to pharmacist for assistance with care management and care coordination.  Follow Up Plan:  Lucas Kline  Upstream Scheduler

## 2020-10-30 DIAGNOSIS — Z8521 Personal history of malignant neoplasm of larynx: Secondary | ICD-10-CM | POA: Diagnosis not present

## 2020-10-30 DIAGNOSIS — J449 Chronic obstructive pulmonary disease, unspecified: Secondary | ICD-10-CM | POA: Diagnosis not present

## 2020-10-30 DIAGNOSIS — E538 Deficiency of other specified B group vitamins: Secondary | ICD-10-CM | POA: Diagnosis not present

## 2020-10-30 DIAGNOSIS — I129 Hypertensive chronic kidney disease with stage 1 through stage 4 chronic kidney disease, or unspecified chronic kidney disease: Secondary | ICD-10-CM | POA: Diagnosis not present

## 2020-10-30 DIAGNOSIS — K5909 Other constipation: Secondary | ICD-10-CM | POA: Diagnosis not present

## 2020-10-30 DIAGNOSIS — E039 Hypothyroidism, unspecified: Secondary | ICD-10-CM | POA: Diagnosis not present

## 2020-10-30 DIAGNOSIS — H906 Mixed conductive and sensorineural hearing loss, bilateral: Secondary | ICD-10-CM | POA: Diagnosis not present

## 2020-10-30 DIAGNOSIS — D631 Anemia in chronic kidney disease: Secondary | ICD-10-CM | POA: Diagnosis not present

## 2020-10-30 DIAGNOSIS — M179 Osteoarthritis of knee, unspecified: Secondary | ICD-10-CM | POA: Diagnosis not present

## 2020-10-30 DIAGNOSIS — I2510 Atherosclerotic heart disease of native coronary artery without angina pectoris: Secondary | ICD-10-CM | POA: Diagnosis not present

## 2020-10-30 DIAGNOSIS — F17220 Nicotine dependence, chewing tobacco, uncomplicated: Secondary | ICD-10-CM | POA: Diagnosis not present

## 2020-10-30 DIAGNOSIS — N1831 Chronic kidney disease, stage 3a: Secondary | ICD-10-CM | POA: Diagnosis not present

## 2020-10-30 DIAGNOSIS — E440 Moderate protein-calorie malnutrition: Secondary | ICD-10-CM | POA: Diagnosis not present

## 2020-10-30 DIAGNOSIS — Z602 Problems related to living alone: Secondary | ICD-10-CM | POA: Diagnosis not present

## 2020-10-30 DIAGNOSIS — E782 Mixed hyperlipidemia: Secondary | ICD-10-CM | POA: Diagnosis not present

## 2020-10-30 DIAGNOSIS — G309 Alzheimer's disease, unspecified: Secondary | ICD-10-CM | POA: Diagnosis not present

## 2020-10-30 DIAGNOSIS — I482 Chronic atrial fibrillation, unspecified: Secondary | ICD-10-CM | POA: Diagnosis not present

## 2020-10-30 DIAGNOSIS — Z9181 History of falling: Secondary | ICD-10-CM | POA: Diagnosis not present

## 2020-11-04 ENCOUNTER — Telehealth: Payer: Self-pay | Admitting: Legal Medicine

## 2020-11-04 NOTE — Chronic Care Management (AMB) (Signed)
  Chronic Care Management   Note  11/04/2020 Name: Herbie Lehrmann MRN: [id number] DOB: [birth year]  Keilen Kahl Wymer is a 87 y.o. year old adult who is a primary care patient of Lillard Anes, MD. I reached out to [name] by phone today in response to a referral sent by   Blanchie Dessert Cowens's PCP, Lillard Anes, MD.     [place] was given information about Chronic Care Management services today including:  1. CCM service includes personalized support from designated clinical staff supervised by [name] "Bill"'s physician, including individualized plan of care and coordination with other care providers 2. 24/7 contact phone numbers for assistance for urgent and routine care needs. 3. Service will only be billed when office clinical staff spend 20 minutes or more in a month to coordinate care. 4. Only one practitioner may furnish and bill the service in a calendar month. 5. The patient may stop CCM services at any time (effective at the end of the month) by phone call to the office staff.   Patient agreed to services and verbal consent obtained.   Follow up plan:   [place]

## 2020-11-05 DIAGNOSIS — E538 Deficiency of other specified B group vitamins: Secondary | ICD-10-CM | POA: Diagnosis not present

## 2020-11-05 DIAGNOSIS — F0280 Dementia in other diseases classified elsewhere without behavioral disturbance: Secondary | ICD-10-CM

## 2020-11-05 DIAGNOSIS — E039 Hypothyroidism, unspecified: Secondary | ICD-10-CM | POA: Diagnosis not present

## 2020-11-05 DIAGNOSIS — G309 Alzheimer's disease, unspecified: Secondary | ICD-10-CM | POA: Diagnosis not present

## 2020-11-06 DIAGNOSIS — I482 Chronic atrial fibrillation, unspecified: Secondary | ICD-10-CM | POA: Diagnosis not present

## 2020-11-06 DIAGNOSIS — I2510 Atherosclerotic heart disease of native coronary artery without angina pectoris: Secondary | ICD-10-CM | POA: Diagnosis not present

## 2020-11-06 DIAGNOSIS — H906 Mixed conductive and sensorineural hearing loss, bilateral: Secondary | ICD-10-CM | POA: Diagnosis not present

## 2020-11-06 DIAGNOSIS — F17220 Nicotine dependence, chewing tobacco, uncomplicated: Secondary | ICD-10-CM | POA: Diagnosis not present

## 2020-11-06 DIAGNOSIS — N1831 Chronic kidney disease, stage 3a: Secondary | ICD-10-CM | POA: Diagnosis not present

## 2020-11-06 DIAGNOSIS — J449 Chronic obstructive pulmonary disease, unspecified: Secondary | ICD-10-CM | POA: Diagnosis not present

## 2020-11-06 DIAGNOSIS — Z9181 History of falling: Secondary | ICD-10-CM | POA: Diagnosis not present

## 2020-11-06 DIAGNOSIS — I129 Hypertensive chronic kidney disease with stage 1 through stage 4 chronic kidney disease, or unspecified chronic kidney disease: Secondary | ICD-10-CM | POA: Diagnosis not present

## 2020-11-06 DIAGNOSIS — Z602 Problems related to living alone: Secondary | ICD-10-CM | POA: Diagnosis not present

## 2020-11-06 DIAGNOSIS — E039 Hypothyroidism, unspecified: Secondary | ICD-10-CM | POA: Diagnosis not present

## 2020-11-06 DIAGNOSIS — M179 Osteoarthritis of knee, unspecified: Secondary | ICD-10-CM | POA: Diagnosis not present

## 2020-11-06 DIAGNOSIS — E538 Deficiency of other specified B group vitamins: Secondary | ICD-10-CM | POA: Diagnosis not present

## 2020-11-06 DIAGNOSIS — G309 Alzheimer's disease, unspecified: Secondary | ICD-10-CM | POA: Diagnosis not present

## 2020-11-06 DIAGNOSIS — E782 Mixed hyperlipidemia: Secondary | ICD-10-CM | POA: Diagnosis not present

## 2020-11-06 DIAGNOSIS — E440 Moderate protein-calorie malnutrition: Secondary | ICD-10-CM | POA: Diagnosis not present

## 2020-11-06 DIAGNOSIS — Z8521 Personal history of malignant neoplasm of larynx: Secondary | ICD-10-CM | POA: Diagnosis not present

## 2020-11-06 DIAGNOSIS — K5909 Other constipation: Secondary | ICD-10-CM | POA: Diagnosis not present

## 2020-11-06 DIAGNOSIS — D631 Anemia in chronic kidney disease: Secondary | ICD-10-CM | POA: Diagnosis not present

## 2020-11-11 DIAGNOSIS — E782 Mixed hyperlipidemia: Secondary | ICD-10-CM | POA: Diagnosis not present

## 2020-11-11 DIAGNOSIS — K5909 Other constipation: Secondary | ICD-10-CM | POA: Diagnosis not present

## 2020-11-11 DIAGNOSIS — I2510 Atherosclerotic heart disease of native coronary artery without angina pectoris: Secondary | ICD-10-CM | POA: Diagnosis not present

## 2020-11-11 DIAGNOSIS — Z8521 Personal history of malignant neoplasm of larynx: Secondary | ICD-10-CM | POA: Diagnosis not present

## 2020-11-11 DIAGNOSIS — D631 Anemia in chronic kidney disease: Secondary | ICD-10-CM | POA: Diagnosis not present

## 2020-11-11 DIAGNOSIS — Z9181 History of falling: Secondary | ICD-10-CM | POA: Diagnosis not present

## 2020-11-11 DIAGNOSIS — N1831 Chronic kidney disease, stage 3a: Secondary | ICD-10-CM | POA: Diagnosis not present

## 2020-11-11 DIAGNOSIS — F17220 Nicotine dependence, chewing tobacco, uncomplicated: Secondary | ICD-10-CM | POA: Diagnosis not present

## 2020-11-11 DIAGNOSIS — J449 Chronic obstructive pulmonary disease, unspecified: Secondary | ICD-10-CM | POA: Diagnosis not present

## 2020-11-11 DIAGNOSIS — I482 Chronic atrial fibrillation, unspecified: Secondary | ICD-10-CM | POA: Diagnosis not present

## 2020-11-11 DIAGNOSIS — H906 Mixed conductive and sensorineural hearing loss, bilateral: Secondary | ICD-10-CM | POA: Diagnosis not present

## 2020-11-11 DIAGNOSIS — E538 Deficiency of other specified B group vitamins: Secondary | ICD-10-CM | POA: Diagnosis not present

## 2020-11-11 DIAGNOSIS — M179 Osteoarthritis of knee, unspecified: Secondary | ICD-10-CM | POA: Diagnosis not present

## 2020-11-11 DIAGNOSIS — I129 Hypertensive chronic kidney disease with stage 1 through stage 4 chronic kidney disease, or unspecified chronic kidney disease: Secondary | ICD-10-CM | POA: Diagnosis not present

## 2020-11-11 DIAGNOSIS — G309 Alzheimer's disease, unspecified: Secondary | ICD-10-CM | POA: Diagnosis not present

## 2020-11-11 DIAGNOSIS — Z602 Problems related to living alone: Secondary | ICD-10-CM | POA: Diagnosis not present

## 2020-11-11 DIAGNOSIS — E039 Hypothyroidism, unspecified: Secondary | ICD-10-CM | POA: Diagnosis not present

## 2020-11-11 DIAGNOSIS — E440 Moderate protein-calorie malnutrition: Secondary | ICD-10-CM | POA: Diagnosis not present

## 2020-11-14 DIAGNOSIS — F17220 Nicotine dependence, chewing tobacco, uncomplicated: Secondary | ICD-10-CM | POA: Diagnosis not present

## 2020-11-14 DIAGNOSIS — I482 Chronic atrial fibrillation, unspecified: Secondary | ICD-10-CM | POA: Diagnosis not present

## 2020-11-14 DIAGNOSIS — I129 Hypertensive chronic kidney disease with stage 1 through stage 4 chronic kidney disease, or unspecified chronic kidney disease: Secondary | ICD-10-CM | POA: Diagnosis not present

## 2020-11-14 DIAGNOSIS — M179 Osteoarthritis of knee, unspecified: Secondary | ICD-10-CM | POA: Diagnosis not present

## 2020-11-14 DIAGNOSIS — I2510 Atherosclerotic heart disease of native coronary artery without angina pectoris: Secondary | ICD-10-CM | POA: Diagnosis not present

## 2020-11-14 DIAGNOSIS — E782 Mixed hyperlipidemia: Secondary | ICD-10-CM | POA: Diagnosis not present

## 2020-11-14 DIAGNOSIS — K5909 Other constipation: Secondary | ICD-10-CM | POA: Diagnosis not present

## 2020-11-14 DIAGNOSIS — D631 Anemia in chronic kidney disease: Secondary | ICD-10-CM | POA: Diagnosis not present

## 2020-11-14 DIAGNOSIS — Z8521 Personal history of malignant neoplasm of larynx: Secondary | ICD-10-CM | POA: Diagnosis not present

## 2020-11-14 DIAGNOSIS — H906 Mixed conductive and sensorineural hearing loss, bilateral: Secondary | ICD-10-CM | POA: Diagnosis not present

## 2020-11-14 DIAGNOSIS — G309 Alzheimer's disease, unspecified: Secondary | ICD-10-CM | POA: Diagnosis not present

## 2020-11-14 DIAGNOSIS — E039 Hypothyroidism, unspecified: Secondary | ICD-10-CM | POA: Diagnosis not present

## 2020-11-14 DIAGNOSIS — J449 Chronic obstructive pulmonary disease, unspecified: Secondary | ICD-10-CM | POA: Diagnosis not present

## 2020-11-14 DIAGNOSIS — Z602 Problems related to living alone: Secondary | ICD-10-CM | POA: Diagnosis not present

## 2020-11-14 DIAGNOSIS — E538 Deficiency of other specified B group vitamins: Secondary | ICD-10-CM | POA: Diagnosis not present

## 2020-11-14 DIAGNOSIS — N1831 Chronic kidney disease, stage 3a: Secondary | ICD-10-CM | POA: Diagnosis not present

## 2020-11-14 DIAGNOSIS — E440 Moderate protein-calorie malnutrition: Secondary | ICD-10-CM | POA: Diagnosis not present

## 2020-11-14 DIAGNOSIS — Z9181 History of falling: Secondary | ICD-10-CM | POA: Diagnosis not present

## 2020-11-18 ENCOUNTER — Ambulatory Visit: Payer: Medicare Other | Admitting: Cardiology

## 2020-11-18 ENCOUNTER — Telehealth: Payer: Self-pay

## 2020-11-18 NOTE — Telephone Encounter (Signed)
[place] left VM requesting verbal orders for speech to see pt one more time due to alzheimer disease.   Return call and gave verbal for speech.   Royce Macadamia, [place] 11/18/20 12:09 PM

## 2020-11-19 DIAGNOSIS — M179 Osteoarthritis of knee, unspecified: Secondary | ICD-10-CM | POA: Diagnosis not present

## 2020-11-19 DIAGNOSIS — J449 Chronic obstructive pulmonary disease, unspecified: Secondary | ICD-10-CM | POA: Diagnosis not present

## 2020-11-19 DIAGNOSIS — I482 Chronic atrial fibrillation, unspecified: Secondary | ICD-10-CM | POA: Diagnosis not present

## 2020-11-19 DIAGNOSIS — D631 Anemia in chronic kidney disease: Secondary | ICD-10-CM | POA: Diagnosis not present

## 2020-11-19 DIAGNOSIS — E538 Deficiency of other specified B group vitamins: Secondary | ICD-10-CM | POA: Diagnosis not present

## 2020-11-19 DIAGNOSIS — H906 Mixed conductive and sensorineural hearing loss, bilateral: Secondary | ICD-10-CM | POA: Diagnosis not present

## 2020-11-19 DIAGNOSIS — K5909 Other constipation: Secondary | ICD-10-CM | POA: Diagnosis not present

## 2020-11-19 DIAGNOSIS — E440 Moderate protein-calorie malnutrition: Secondary | ICD-10-CM | POA: Diagnosis not present

## 2020-11-19 DIAGNOSIS — I129 Hypertensive chronic kidney disease with stage 1 through stage 4 chronic kidney disease, or unspecified chronic kidney disease: Secondary | ICD-10-CM | POA: Diagnosis not present

## 2020-11-19 DIAGNOSIS — G309 Alzheimer's disease, unspecified: Secondary | ICD-10-CM | POA: Diagnosis not present

## 2020-11-19 DIAGNOSIS — F17220 Nicotine dependence, chewing tobacco, uncomplicated: Secondary | ICD-10-CM | POA: Diagnosis not present

## 2020-11-19 DIAGNOSIS — I2510 Atherosclerotic heart disease of native coronary artery without angina pectoris: Secondary | ICD-10-CM | POA: Diagnosis not present

## 2020-11-19 DIAGNOSIS — Z9181 History of falling: Secondary | ICD-10-CM | POA: Diagnosis not present

## 2020-11-19 DIAGNOSIS — Z8521 Personal history of malignant neoplasm of larynx: Secondary | ICD-10-CM | POA: Diagnosis not present

## 2020-11-19 DIAGNOSIS — N1831 Chronic kidney disease, stage 3a: Secondary | ICD-10-CM | POA: Diagnosis not present

## 2020-11-19 DIAGNOSIS — E782 Mixed hyperlipidemia: Secondary | ICD-10-CM | POA: Diagnosis not present

## 2020-11-19 DIAGNOSIS — E039 Hypothyroidism, unspecified: Secondary | ICD-10-CM | POA: Diagnosis not present

## 2020-11-19 DIAGNOSIS — Z602 Problems related to living alone: Secondary | ICD-10-CM | POA: Diagnosis not present

## 2020-11-21 DIAGNOSIS — G309 Alzheimer's disease, unspecified: Secondary | ICD-10-CM | POA: Diagnosis not present

## 2020-11-21 DIAGNOSIS — K5909 Other constipation: Secondary | ICD-10-CM | POA: Diagnosis not present

## 2020-11-21 DIAGNOSIS — Z8521 Personal history of malignant neoplasm of larynx: Secondary | ICD-10-CM | POA: Diagnosis not present

## 2020-11-21 DIAGNOSIS — D631 Anemia in chronic kidney disease: Secondary | ICD-10-CM | POA: Diagnosis not present

## 2020-11-21 DIAGNOSIS — E782 Mixed hyperlipidemia: Secondary | ICD-10-CM | POA: Diagnosis not present

## 2020-11-21 DIAGNOSIS — N1831 Chronic kidney disease, stage 3a: Secondary | ICD-10-CM | POA: Diagnosis not present

## 2020-11-21 DIAGNOSIS — E039 Hypothyroidism, unspecified: Secondary | ICD-10-CM | POA: Diagnosis not present

## 2020-11-21 DIAGNOSIS — E538 Deficiency of other specified B group vitamins: Secondary | ICD-10-CM | POA: Diagnosis not present

## 2020-11-21 DIAGNOSIS — I2510 Atherosclerotic heart disease of native coronary artery without angina pectoris: Secondary | ICD-10-CM | POA: Diagnosis not present

## 2020-11-21 DIAGNOSIS — H906 Mixed conductive and sensorineural hearing loss, bilateral: Secondary | ICD-10-CM | POA: Diagnosis not present

## 2020-11-21 DIAGNOSIS — I129 Hypertensive chronic kidney disease with stage 1 through stage 4 chronic kidney disease, or unspecified chronic kidney disease: Secondary | ICD-10-CM | POA: Diagnosis not present

## 2020-11-21 DIAGNOSIS — J449 Chronic obstructive pulmonary disease, unspecified: Secondary | ICD-10-CM | POA: Diagnosis not present

## 2020-11-21 DIAGNOSIS — M179 Osteoarthritis of knee, unspecified: Secondary | ICD-10-CM | POA: Diagnosis not present

## 2020-11-21 DIAGNOSIS — E440 Moderate protein-calorie malnutrition: Secondary | ICD-10-CM | POA: Diagnosis not present

## 2020-11-21 DIAGNOSIS — F17220 Nicotine dependence, chewing tobacco, uncomplicated: Secondary | ICD-10-CM | POA: Diagnosis not present

## 2020-11-21 DIAGNOSIS — Z9181 History of falling: Secondary | ICD-10-CM | POA: Diagnosis not present

## 2020-11-21 DIAGNOSIS — Z602 Problems related to living alone: Secondary | ICD-10-CM | POA: Diagnosis not present

## 2020-11-21 DIAGNOSIS — I482 Chronic atrial fibrillation, unspecified: Secondary | ICD-10-CM | POA: Diagnosis not present

## 2020-11-26 ENCOUNTER — Telehealth: Payer: Self-pay

## 2020-11-26 DIAGNOSIS — N1831 Chronic kidney disease, stage 3a: Secondary | ICD-10-CM | POA: Diagnosis not present

## 2020-11-26 DIAGNOSIS — I129 Hypertensive chronic kidney disease with stage 1 through stage 4 chronic kidney disease, or unspecified chronic kidney disease: Secondary | ICD-10-CM | POA: Diagnosis not present

## 2020-11-26 DIAGNOSIS — Z602 Problems related to living alone: Secondary | ICD-10-CM | POA: Diagnosis not present

## 2020-11-26 DIAGNOSIS — E782 Mixed hyperlipidemia: Secondary | ICD-10-CM | POA: Diagnosis not present

## 2020-11-26 DIAGNOSIS — Z8521 Personal history of malignant neoplasm of larynx: Secondary | ICD-10-CM | POA: Diagnosis not present

## 2020-11-26 DIAGNOSIS — D631 Anemia in chronic kidney disease: Secondary | ICD-10-CM | POA: Diagnosis not present

## 2020-11-26 DIAGNOSIS — E440 Moderate protein-calorie malnutrition: Secondary | ICD-10-CM | POA: Diagnosis not present

## 2020-11-26 DIAGNOSIS — I2510 Atherosclerotic heart disease of native coronary artery without angina pectoris: Secondary | ICD-10-CM | POA: Diagnosis not present

## 2020-11-26 DIAGNOSIS — Z9181 History of falling: Secondary | ICD-10-CM | POA: Diagnosis not present

## 2020-11-26 DIAGNOSIS — I482 Chronic atrial fibrillation, unspecified: Secondary | ICD-10-CM | POA: Diagnosis not present

## 2020-11-26 DIAGNOSIS — E039 Hypothyroidism, unspecified: Secondary | ICD-10-CM | POA: Diagnosis not present

## 2020-11-26 DIAGNOSIS — H906 Mixed conductive and sensorineural hearing loss, bilateral: Secondary | ICD-10-CM | POA: Diagnosis not present

## 2020-11-26 DIAGNOSIS — M179 Osteoarthritis of knee, unspecified: Secondary | ICD-10-CM | POA: Diagnosis not present

## 2020-11-26 DIAGNOSIS — G309 Alzheimer's disease, unspecified: Secondary | ICD-10-CM | POA: Diagnosis not present

## 2020-11-26 DIAGNOSIS — F17220 Nicotine dependence, chewing tobacco, uncomplicated: Secondary | ICD-10-CM | POA: Diagnosis not present

## 2020-11-26 DIAGNOSIS — E538 Deficiency of other specified B group vitamins: Secondary | ICD-10-CM | POA: Diagnosis not present

## 2020-11-26 DIAGNOSIS — J449 Chronic obstructive pulmonary disease, unspecified: Secondary | ICD-10-CM | POA: Diagnosis not present

## 2020-11-26 DIAGNOSIS — K5909 Other constipation: Secondary | ICD-10-CM | POA: Diagnosis not present

## 2020-11-26 NOTE — Telephone Encounter (Signed)
Daleen Snook, [facility], did pt's 30 day assessment today. Pt is not homebound. He walked, marched laps around yard today. States as she was leaving he drove out of driveway as well. She advised daughter to try to slow him down due to dementia so there would not be any problems. Therefore PT d/c pt.   Royce Macadamia, [place] 11/26/20 2:35 PM

## 2020-12-12 NOTE — Progress Notes (Deleted)
Chronic Care Management Pharmacy Note  12/12/2020 Name:  Lucas Kline MRN:  [id number] DOB:  30-May-1929  Subjective: Lucas Kline is an 87 y.o. year old adult who is a primary patient of Henrene Pastor, Zeb Comfort, MD.  The CCM team was consulted for assistance with disease management and care coordination needs.    Engaged with patient by telephone for initial visit in response to provider referral for pharmacy case management and/or care coordination services.   Consent to Services:  The patient was given the following information about Chronic Care Management services today, agreed to services, and gave verbal consent: 1. CCM service includes personalized support from designated clinical staff supervised by the primary care provider, including individualized plan of care and coordination with other care providers 2. 24/7 contact phone numbers for assistance for urgent and routine care needs. 3. Service will only be billed when office clinical staff spend 20 minutes or more in a month to coordinate care. 4. Only one practitioner may furnish and bill the service in a calendar month. 5.The patient may stop CCM services at any time (effective at the end of the month) by phone call to the office staff. 6. The patient will be responsible for cost sharing (co-pay) of up to 20% of the service fee (after annual deductible is met). Patient agreed to services and consent obtained.  Patient Care Team: Lillard Anes, MD as PCP - General (Family Medicine) Burnice Logan, [facility] as Pharmacist (Pharmacist)  Recent office visits: *** 10/23/2020 - Still chronic anemia, glucose 100, kidney tests remain stage 3a, liver tests normal, cholesterol normal, TSH high 4.8, I restarted his thyroid medicine 09/08/2020 - patient has poor memory and refused MMSE. Takes namenda. AWV and immunizations reviewed.  07/24/2020 - Anemia stable, no changes in kidney tests, liver tests normal, Cholesterol normal,  TSH 1.6 normal. Recommend hearing evaluation Recent consult visits: *** 10/23/2020 - call back on home bp meds. Would like to lower to avoid falls. Stop amlodipine and monitor bp  07/24/2020 - cardio - stop amlodipine. BP stable. 1 week monitor is recommended. Reduce simvastatin to 20 mg daily since taking verapamil. Rosuvastatin or Atorvastatin recommended due to verapamil interaction.   Hospital visits: {Hospital DC Yes/No:25215}  Objective:  Lab Results  Component Value Date   CREATININE 1.27 10/23/2020   BUN 13 10/23/2020   GFRNONAA 50 (L) 10/23/2020   GFRAA 58 (L) 10/23/2020   NA 140 10/23/2020   K 3.9 10/23/2020   CALCIUM 8.6 10/23/2020   CO2 21 10/23/2020   GLUCOSE 100 (H) 10/23/2020    No results found for: HGBA1C, FRUCTOSAMINE, GFR, MICROALBUR  Last diabetic Eye exam: No results found for: HMDIABEYEEXA  Last diabetic Foot exam: No results found for: HMDIABFOOTEX   Lab Results  Component Value Date   CHOL 152 10/23/2020   HDL 44 10/23/2020   LDLCALC 92 10/23/2020   TRIG 83 10/23/2020   CHOLHDL 3.5 10/23/2020    Hepatic Function Latest Ref Rng & Units 10/23/2020 07/24/2020 04/23/2020  Total Protein 6.0 - 8.5 g/dL 6.0 6.2 6.7  Albumin 3.6 - 4.6 g/dL 3.7 4.2 4.4  AST 0 - 40 IU/L _0 ALT 0 - 44 IU/L _1 Alk Phosphatase 44 - 121 IU/L 52 56 58  Total Bilirubin 0.0 - 1.2 mg/dL 0.5 0.6 0.5  Bilirubin, Direct 0.0 - 0.2 mg/dL - - -    Lab Results  Component Value Date/Time   TSH 4.810 (H) 10/23/2020  09:32 AM   TSH 1.630 07/24/2020 08:40 AM    CBC Latest Ref Rng & Units 10/23/2020 07/24/2020 04/23/2020  WBC 3.4 - 10.8 x10E3/uL 3.7 4.8 5.4  Hemoglobin 13.0 - 17.7 g/dL 12.5(L) 12.0(L) 12.9(L)  Hematocrit 37.5 - 51.0 % 35.4(L) 34.9(L) 38.8  Platelets 150 - 450 x10E3/uL 199 178 183    No results found for: VD25OH  Clinical ASCVD: {YES/NO:21197} The ASCVD Risk score [name], et al., 2013) failed to calculate for the following reasons:   The 2013 ASCVD  risk score is only valid for ages 40 to 10    Depression screen PHQ 2/9 09/08/2020 01/21/2020 01/21/2020  Decreased Interest 0 0 0  Down, Depressed, Hopeless 0 0 0  PHQ - 2 Score 0 0 0     ***Other: (CHADS2VASc if Afib, MMRC or CAT for COPD, ACT, DEXA)  Social History   Tobacco Use  Smoking Status Former Smoker  . Types: Cigarettes  . Quit date: 2008  . Years since quitting: 14.2  Smokeless Tobacco Current User  . Types: Chew  Tobacco Comment   1 pouch sometimes   BP Readings from Last 3 Encounters:  10/23/20 104/60  10/23/20 100/62  09/08/20 100/60   Pulse Readings from Last 3 Encounters:  10/23/20 78  10/23/20 77  09/08/20 79   Wt Readings from Last 3 Encounters:  10/23/20 112 lb 6.4 oz (51 kg)  10/23/20 118 lb (53.5 kg)  09/08/20 116 lb (52.6 kg)   BMI Readings from Last 3 Encounters:  10/23/20 18.14 kg/m  10/23/20 20.90 kg/m  09/08/20 20.55 kg/m    Assessment/Interventions: Review of patient past medical history, allergies, medications, health status, including review of consultants reports, laboratory and other test data, was performed as part of comprehensive evaluation and provision of chronic care management services.   SDOH:  (Social Determinants of Health) assessments and interventions performed: {yes/no:20286}  SDOH Screenings   Alcohol Screen: Low Risk   . Last Alcohol Screening Score (AUDIT): 0  Depression (PHQ2-9): Low Risk   . PHQ-2 Score: 0  Financial Resource Strain: Low Risk   . Difficulty of Paying Living Expenses: Not hard at all  Food Insecurity: No Food Insecurity  . Worried About Charity fundraiser in the Last Year: Never true  . Ran Out of Food in the Last Year: Never true  Housing: Low Risk   . Last Housing Risk Score: 0  Physical Activity: Insufficiently Active  . Days of Exercise per Week: 2 days  . Minutes of Exercise per Session: 60 min  Social Connections: Moderately Isolated  . Frequency of Communication with Friends and  Family: Once a week  . Frequency of Social Gatherings with Friends and Family: More than three times a week  . Attends Religious Services: Never  . Active Member of Clubs or Organizations: No  . Attends Archivist Meetings: More than 4 times per year  . Marital Status: Widowed  Stress: No Stress Concern Present  . Feeling of Stress : Not at all  Tobacco Use: High Risk  . Smoking Tobacco Use: Former Smoker  . Smokeless Tobacco Use: Current User  Transportation Needs: No Transportation Needs  . Lack of Transportation (Medical): No  . Lack of Transportation (Non-Medical): No    CCM Care Plan  Allergies  Allergen Reactions  . Aricept [Donepezil] Other (See Comments)    Abdominal pain, diarrhea    Medications Reviewed Today    Reviewed by Lowella Grip, CMA (Certified  Medical Assistant) on 10/23/20 at 1053  Med List Status: <None>  Medication Order Taking? Sig Documenting Provider Last Dose Status Informant  cyanocobalamin (,VITAMIN B-12,) 1000 MCG/ML injection [id number]  Inject 1 mL (1,000 mcg total) into the muscle every 30 (thirty) days. Lillard Anes, MD  Active   levothyroxine (SYNTHROID) 50 MCG tablet [id number]  Take 1 tablet (50 mcg total) by mouth daily. Lillard Anes, MD  Active   memantine [facility]) 5 MG tablet [id number]  Take 1 tablet (5 mg total) by mouth 2 (two) times daily. Lillard Anes, MD  Active   simvastatin (ZOCOR) 20 MG tablet [id number]  Take 1 tablet (20 mg total) by mouth daily. RevankarReita Cliche, MD  Active   verapamil (CALAN-SR) 240 MG CR tablet [id number]  TAKE 1 TABLET BY MOUTH EVERY DAY Lillard Anes, MD  Active           Patient Active Problem List   Diagnosis Date Noted  . Essential hypertension, benign   . Memory deficit 09/08/2020  . Routine general medical examination at a health care facility 09/08/2020  . B12 deficiency   . CKD (chronic kidney disease), stage II   . Coronary atherosclerosis   .  Essential tremor   . Mixed hearing loss, bilateral   . Abnormal EKG 01/28/2020  . Essential hypertension 01/28/2020  . Bilateral carotid bruits 01/28/2020  . Primary hypothyroidism 01/22/2020  . Unstable gait 01/21/2020  . Obstructive chronic bronchitis without exacerbation ([place]) 01/21/2020  . Adult BMI <19 kg/sq m 01/21/2020  . Malnutrition of moderate degree ([place]) 01/21/2020  . Chronic atrial fibrillation ([place]) 01/21/2020  . Personal history of malignant neoplasm of larynx 01/18/2020  . Osteoarthritis   . Alzheimer disease ([place])   . CKD (chronic kidney disease), stage III ([place])   . Atherosclerotic heart disease of native coronary artery without angina pectoris   . Mixed hyperlipidemia     Immunization History  Administered Date(s) Administered  . Pneumococcal Conjugate-13 01/17/2014  . Pneumococcal Polysaccharide-23 01/17/2014  . Tdap 12/29/2011    Conditions to be addressed/monitored:  Hypertension, Hyperlipidemia, Diabetes, COPD and Hypothyroidism  There are no care plans that you recently modified to display for this patient.    Medication Assistance: {MEDASSISTANCEINFO:25044}  Patient's preferred pharmacy is:  [id number] [place] [id number]: [phone]: [phone] Uses pill box? Yes Pt endorses ***% compliance  We discussed: Benefits of medication synchronization, packaging and delivery as well as enhanced pharmacist oversight with [facility]. Patient decided to: [facility] Care Plan and Follow Up Patient Decision:  Patient agrees to Care Plan and Follow-up.  Plan: Telephone follow up appointment with care management team member scheduled for:  ***  ***  Current Barriers:  . {pharmacybarriers:24917} . ***  Pharmacist Clinical Goal(s):  .Marland KitchenPatient will {PHARMACYGOALCHOICES:24921} through collaboration with PharmD and provider.   . ***  Interventions: . 1:1 collaboration with PLillard Anes MD regarding development and update of comprehensive plan of care as evidenced by provider attestation and co-signature . Inter-disciplinary care team collaboration (see longitudinal plan of care) . Comprehensive medication review performed; medication list updated in electronic medical record  Hypertension (BP goal {CHL HP [facility] Pharmacist BP ranges:[id number]}) -{US controlled/uncontrolled:25276} -Current treatment: . ***verapamil CR 240 mg daily  -Medications previously tried: ***  -Current home readings: *** -Current dietary habits: *** -Current exercise habits: *** -{ACTIONS;DENIES/REPORTS:21021675::"Denies"} hypotensive/hypertensive symptoms -Educated on {  CCM BP Counseling:25124} -Counseled to monitor BP at home ***, document, and provide log at future appointments -{CCMPHARMDINTERVENTION:25122}  Hyperlipidemia: (LDL goal < ***) -{US controlled/uncontrolled:25276} -Current treatment: . ***simvastatin 20 mg daily or atorvastatin/rosuvastatin suggested due to verapamil -Medications previously tried: ***  -Current dietary patterns: *** -Current exercise habits: *** -Educated on {CCM HLD Counseling:25126} -{CCMPHARMDINTERVENTION:25122}  Atrial Fibrillation (Goal: prevent stroke and major bleeding) -{US controlled/uncontrolled:25276} -CHADSVASC: *** -Current treatment: . Rate control: *** verapamil cr 240 mg daily  . Anticoagulation: ***Eliquis ***??? -Medications previously tried: *** -Home BP and HR readings: ***  -Counseled on {CCMAFIBCOUNSELING:25120} -{CCMPHARMDINTERVENTION:25122}  COPD (Goal: control symptoms and prevent exacerbations) -{US controlled/uncontrolled:25276} -Current treatment  . *** -Medications previously tried: ***  -Gold Grade: {CHL HP [facility] Pharm COPD Gold VWUJW:[id number]} -Current COPD Classification:  {CHL HP [facility] Pharm COPD  Classification:[phone]} -MMRC/CAT score: *** -Pulmonary function testing: *** -Exacerbations requiring treatment in last 6 months: *** -Patient {Actions; denies-reports:120008} consistent use of maintenance inhaler -Frequency of rescue inhaler use: *** -Counseled on {CCMINHALERCOUNSELING:25121} -{CCMPHARMDINTERVENTION:25122}   *** (Goal: ***) -{US controlled/uncontrolled:25276} -Current treatment  . ***memantine 5 mg bid  -Medications previously tried: ***  -{CCMPHARMDINTERVENTION:25122}  Health Maintenance -Vaccine gaps: *** COVID, shingrix? flu *** -Current therapy:  . ***cyanocobalamin b-12 1000 mcg/mL 1 mls every 30 days -Educated on {ccm supplement counseling:25128} -{CCM Patient satisfied:25129} -{CCMPHARMDINTERVENTION:25122}  Patient Goals/Self-Care Activities . Patient will:  - {pharmacypatientgoals:24919}  Follow Up Plan: {CM FOLLOW UP AOZH:08657}

## 2020-12-16 ENCOUNTER — Telehealth: Payer: Self-pay

## 2020-12-16 NOTE — Progress Notes (Signed)
noted in error, correct note was 12/18/2020        Marcine Matar, [place] Clinical Pharmacist Assistant

## 2020-12-18 ENCOUNTER — Telehealth: Payer: Medicare Other

## 2020-12-18 ENCOUNTER — Telehealth: Payer: Self-pay

## 2020-12-18 NOTE — Chronic Care Management (AMB) (Signed)
    Chronic Care Management Pharmacy Assistant   Name: Corin Tilly  MRN: [id number] DOB: [birth year]  Lucas Kline is an 87 y.o. year old adult who presents for his initial CCM visit with the clinical pharmacist.  Reason for Encounter: Initial call questions    Recent office visits:  10/23/2020: Lillard Anes, MD  (PCP) / Discontinued Amlodipine Besylate and Levothyroxine. 09/08/2021:  Lillard Anes, MD  (PCP) / No medication changes noted. 07/24/2020:  Lillard Anes, MD  (PCP) / Started Aspirin 81 mg. Tablet: Take 1 tablet by mouth once daily and swallow whole.   Recent consult visits:  10/23/2020: Sunny Schlein R. Revankar, MD (Cardiology) / No medication changes noted.  07/24/2020:Rajan R. Revankar, MD (Cardiology) / No medication changes noted.    Hospital visits:  None in previous 6 months  Medications: Outpatient Encounter Medications as of 12/18/2020  Medication Sig  . cyanocobalamin (,VITAMIN B-12,) 1000 MCG/ML injection Inject 1 mL (1,000 mcg total) into the muscle every 30 (thirty) days.  Marland Kitchen levothyroxine (SYNTHROID) 50 MCG tablet Take 1 tablet (50 mcg total) by mouth daily.  . simvastatin (ZOCOR) 20 MG tablet Take 10 mg by mouth daily.  . verapamil (CALAN-SR) 240 MG CR tablet TAKE 1 TABLET BY MOUTH EVERY DAY   No facility-administered encounter medications on file as of 12/18/2020.  Have you seen any other providers since your last visit? Patient stated that he has not seen any other providers since his last visit.   Any changes in your medications or health?  Patient stated he has not had any changes to his medications or health.  Any side effects from any medications?  Patient reported no side effects from any medications.  Do you have an symptoms or problems not managed by your medications?  Patient reported no symptoms or problems not being managed by his medications.  Any concerns about your health right now?  Patient stated he  does not have any health concerns at this time.  Has your provider asked that you check blood pressure, blood sugar, or follow special diet at home?  Patient stated he does not check his blood pressure, blood sugar, or follow any special diet at home.  Do you get any type of exercise on a regular basis? Patient stated he is "always outside". He reported walking his dogs, mowing his lawn and/or cutting wood daily.   Can you think of a goal you would like to reach for your health?  Patient stated he does not have any goals that he would like to reach.   Do you have any problems getting your medications?  Patient stated he does not have any problems getting his medications.  Is there anything that you would like to discuss during the appointment?  Patient stated that he does not have anything that he would like to discuss during his appointment.   Please bring medications and supplements to appointment   Gaston Islam, [place]

## 2020-12-18 NOTE — Progress Notes (Deleted)
Chronic Care Management Pharmacy Note  12/18/2020 Name:  Lucas Kline MRN:  [id number] DOB:  05-Sep-1929  Subjective: Lucas Kline is an 87 y.o. year old adult who is a primary patient of Henrene Pastor, Zeb Comfort, MD.  The CCM team was consulted for assistance with disease management and care coordination needs.    Engaged with patient by telephone for initial visit in response to provider referral for pharmacy case management and/or care coordination services.   Consent to Services:  The patient was given the following information about Chronic Care Management services today, agreed to services, and gave verbal consent: 1. CCM service includes personalized support from designated clinical staff supervised by the primary care provider, including individualized plan of care and coordination with other care providers 2. 24/7 contact phone numbers for assistance for urgent and routine care needs. 3. Service will only be billed when office clinical staff spend 20 minutes or more in a month to coordinate care. 4. Only one practitioner may furnish and bill the service in a calendar month. 5.The patient may stop CCM services at any time (effective at the end of the month) by phone call to the office staff. 6. The patient will be responsible for cost sharing (co-pay) of up to 20% of the service fee (after annual deductible is met). Patient agreed to services and consent obtained.  Patient Care Team: Lillard Anes, MD as PCP - General (Family Medicine) Burnice Logan, [facility] as Pharmacist (Pharmacist)  Recent office visits: *** 10/23/2020 - Still chronic anemia, glucose 100, kidney tests remain stage 3a, liver tests normal, cholesterol normal, TSH high 4.8, I restarted his thyroid medicine 09/08/2020 - patient has poor memory and refused MMSE. Takes namenda. AWV and immunizations reviewed.  07/24/2020 - Anemia stable, no changes in kidney tests, liver tests normal, Cholesterol normal,  TSH 1.6 normal. Recommend hearing evaluation Recent consult visits: *** 10/23/2020 - call back on home bp meds. Would like to lower to avoid falls. Stop amlodipine and monitor bp  07/24/2020 - cardio - stop amlodipine. BP stable. 1 week monitor is recommended. Reduce simvastatin to 20 mg daily since taking verapamil. Rosuvastatin or Atorvastatin recommended due to verapamil interaction.   Hospital visits: None in previous 6 months  Objective:  Lab Results  Component Value Date   CREATININE 1.27 10/23/2020   BUN 13 10/23/2020   GFRNONAA 50 (L) 10/23/2020   GFRAA 58 (L) 10/23/2020   NA 140 10/23/2020   K 3.9 10/23/2020   CALCIUM 8.6 10/23/2020   CO2 21 10/23/2020   GLUCOSE 100 (H) 10/23/2020    No results found for: HGBA1C, FRUCTOSAMINE, GFR, MICROALBUR  Last diabetic Eye exam: No results found for: HMDIABEYEEXA  Last diabetic Foot exam: No results found for: HMDIABFOOTEX   Lab Results  Component Value Date   CHOL 152 10/23/2020   HDL 44 10/23/2020   LDLCALC 92 10/23/2020   TRIG 83 10/23/2020   CHOLHDL 3.5 10/23/2020    Hepatic Function Latest Ref Rng & Units 10/23/2020 07/24/2020 04/23/2020  Total Protein 6.0 - 8.5 g/dL 6.0 6.2 6.7  Albumin 3.6 - 4.6 g/dL 3.7 4.2 4.4  AST 0 - 40 IU/L _0 ALT 0 - 44 IU/L _1 Alk Phosphatase 44 - 121 IU/L 52 56 58  Total Bilirubin 0.0 - 1.2 mg/dL 0.5 0.6 0.5  Bilirubin, Direct 0.0 - 0.2 mg/dL - - -    Lab Results  Component Value Date/Time   TSH 4.810 (  H) 10/23/2020 09:32 AM   TSH 1.630 07/24/2020 08:40 AM    CBC Latest Ref Rng & Units 10/23/2020 07/24/2020 04/23/2020  WBC 3.4 - 10.8 x10E3/uL 3.7 4.8 5.4  Hemoglobin 13.0 - 17.7 g/dL 12.5(L) 12.0(L) 12.9(L)  Hematocrit 37.5 - 51.0 % 35.4(L) 34.9(L) 38.8  Platelets 150 - 450 x10E3/uL 199 178 183    No results found for: VD25OH  Clinical ASCVD: {YES/NO:21197} The ASCVD Risk score [name], et al., 2013) failed to calculate for the following reasons:   The 2013 ASCVD  risk score is only valid for ages 12 to 76    Depression screen PHQ 2/9 09/08/2020 01/21/2020 01/21/2020  Decreased Interest 0 0 0  Down, Depressed, Hopeless 0 0 0  PHQ - 2 Score 0 0 0     ***Other: (CHADS2VASc if Afib, MMRC or CAT for COPD, ACT, DEXA)  Social History   Tobacco Use  Smoking Status Former Smoker  . Types: Cigarettes  . Quit date: 2008  . Years since quitting: 14.2  Smokeless Tobacco Current User  . Types: Chew  Tobacco Comment   1 pouch sometimes   BP Readings from Last 3 Encounters:  10/23/20 104/60  10/23/20 100/62  09/08/20 100/60   Pulse Readings from Last 3 Encounters:  10/23/20 78  10/23/20 77  09/08/20 79   Wt Readings from Last 3 Encounters:  10/23/20 112 lb 6.4 oz (51 kg)  10/23/20 118 lb (53.5 kg)  09/08/20 116 lb (52.6 kg)   BMI Readings from Last 3 Encounters:  10/23/20 18.14 kg/m  10/23/20 20.90 kg/m  09/08/20 20.55 kg/m    Assessment/Interventions: Review of patient past medical history, allergies, medications, health status, including review of consultants reports, laboratory and other test data, was performed as part of comprehensive evaluation and provision of chronic care management services.   SDOH:  (Social Determinants of Health) assessments and interventions performed: {yes/no:20286}  SDOH Screenings   Alcohol Screen: Low Risk   . Last Alcohol Screening Score (AUDIT): 0  Depression (PHQ2-9): Low Risk   . PHQ-2 Score: 0  Financial Resource Strain: Low Risk   . Difficulty of Paying Living Expenses: Not hard at all  Food Insecurity: No Food Insecurity  . Worried About Charity fundraiser in the Last Year: Never true  . Ran Out of Food in the Last Year: Never true  Housing: Low Risk   . Last Housing Risk Score: 0  Physical Activity: Insufficiently Active  . Days of Exercise per Week: 2 days  . Minutes of Exercise per Session: 60 min  Social Connections: Moderately Isolated  . Frequency of Communication with Friends and  Family: Once a week  . Frequency of Social Gatherings with Friends and Family: More than three times a week  . Attends Religious Services: Never  . Active Member of Clubs or Organizations: No  . Attends Archivist Meetings: More than 4 times per year  . Marital Status: Widowed  Stress: No Stress Concern Present  . Feeling of Stress : Not at all  Tobacco Use: High Risk  . Smoking Tobacco Use: Former Smoker  . Smokeless Tobacco Use: Current User  Transportation Needs: No Transportation Needs  . Lack of Transportation (Medical): No  . Lack of Transportation (Non-Medical): No    CCM Care Plan  Allergies  Allergen Reactions  . Aricept [Donepezil] Other (See Comments)    Abdominal pain, diarrhea    Medications Reviewed Today    Reviewed by Lowella Grip,  CMA Estate agent) on 10/23/20 at 1053  Med List Status: <None>  Medication Order Taking? Sig Documenting Provider Last Dose Status Informant  cyanocobalamin (,VITAMIN B-12,) 1000 MCG/ML injection [id number]  Inject 1 mL (1,000 mcg total) into the muscle every 30 (thirty) days. Lillard Anes, MD  Active   levothyroxine (SYNTHROID) 50 MCG tablet [id number]  Take 1 tablet (50 mcg total) by mouth daily. Lillard Anes, MD  Active   memantine [facility]) 5 MG tablet [id number]  Take 1 tablet (5 mg total) by mouth 2 (two) times daily. Lillard Anes, MD  Active   simvastatin (ZOCOR) 20 MG tablet [id number]  Take 1 tablet (20 mg total) by mouth daily. RevankarReita Cliche, MD  Active   verapamil (CALAN-SR) 240 MG CR tablet [id number]  TAKE 1 TABLET BY MOUTH EVERY DAY Lillard Anes, MD  Active           Patient Active Problem List   Diagnosis Date Noted  . Essential hypertension, benign   . Memory deficit 09/08/2020  . Routine general medical examination at a health care facility 09/08/2020  . B12 deficiency   . CKD (chronic kidney disease), stage II   . Coronary atherosclerosis   .  Essential tremor   . Mixed hearing loss, bilateral   . Abnormal EKG 01/28/2020  . Essential hypertension 01/28/2020  . Bilateral carotid bruits 01/28/2020  . Primary hypothyroidism 01/22/2020  . Unstable gait 01/21/2020  . Obstructive chronic bronchitis without exacerbation ([place]) 01/21/2020  . Adult BMI <19 kg/sq m 01/21/2020  . Malnutrition of moderate degree ([place]) 01/21/2020  . Chronic atrial fibrillation ([place]) 01/21/2020  . Personal history of malignant neoplasm of larynx 01/18/2020  . Osteoarthritis   . Alzheimer disease ([place])   . CKD (chronic kidney disease), stage III ([place])   . Atherosclerotic heart disease of native coronary artery without angina pectoris   . Mixed hyperlipidemia     Immunization History  Administered Date(s) Administered  . Pneumococcal Conjugate-13 01/17/2014  . Pneumococcal Polysaccharide-23 01/17/2014  . Tdap 12/29/2011    Conditions to be addressed/monitored:  Hypertension, Hyperlipidemia, Diabetes, COPD and Hypothyroidism  There are no care plans that you recently modified to display for this patient.    Medication Assistance: {MEDASSISTANCEINFO:25044}  Patient's preferred pharmacy is:  [id number] [place] [address]Phone: [phone]: [phone] Uses pill box? Yes Pt endorses ***% compliance  We discussed: Benefits of medication synchronization, packaging and delivery as well as enhanced pharmacist oversight with [facility]. Patient decided to: [facility] Care Plan and Follow Up Patient Decision:  Patient agrees to Care Plan and Follow-up.  Plan: Telephone follow up appointment with care management team member scheduled for:  ***  ***  Current Barriers:  . {pharmacybarriers:24917} . ***  Pharmacist Clinical Goal(s):  .Marland KitchenPatient will {PHARMACYGOALCHOICES:24921} through collaboration with PharmD and provider.   . ***  Interventions: . 1:1 collaboration with PLillard Anes MD regarding development and update of comprehensive plan of care as evidenced by provider attestation and co-signature . Inter-disciplinary care team collaboration (see longitudinal plan of care) . Comprehensive medication review performed; medication list updated in electronic medical record  Hypertension (BP goal {CHL HP [facility] Pharmacist BP ranges:[phone]}) -{US controlled/uncontrolled:25276} -Current treatment: . ***verapamil CR 240 mg daily  -Medications previously tried: ***  -Current home readings: *** -Current dietary habits: *** Does not enjoy boost or ensure. Eats  a lot of hamburger for protein source.  -Current exercise habits: *** -{ACTIONS;DENIES/REPORTS:[id number]::"Denies"} hypotensive/hypertensive symptoms -Educated on {CCM BP Counseling:25124} -Counseled to monitor BP at home ***, document, and provide log at future appointments -{CCMPHARMDINTERVENTION:25122}  Hyperlipidemia: (LDL goal < ***) -{US controlled/uncontrolled:25276} -Current treatment: . ***simvastatin 20 mg daily or atorvastatin/rosuvastatin suggested due to verapamil -Medications previously tried: ***  -Current dietary patterns: *** -Current exercise habits: *** -Educated on {CCM HLD Counseling:25126} -{CCMPHARMDINTERVENTION:25122}  Atrial Fibrillation (Goal: prevent stroke and major bleeding) -{US controlled/uncontrolled:25276} -CHADSVASC: *** -Current treatment: . Rate control: *** verapamil cr 240 mg daily  . Anticoagulation: ***Eliquis ***??? -Medications previously tried: *** -Home BP and HR readings: ***  -Counseled on {CCMAFIBCOUNSELING:25120} -{CCMPHARMDINTERVENTION:25122}  COPD (Goal: control symptoms and prevent exacerbations) -{US controlled/uncontrolled:25276} -Current treatment  . *** -Medications previously tried: ***  -Gold Grade: {CHL HP [facility] Pharm COPD Gold [id number]} -Current COPD  Classification:  {CHL HP [facility] Pharm COPD Classification:[phone]} -MMRC/CAT score: *** -Pulmonary function testing: *** -Exacerbations requiring treatment in last 6 months: *** -Patient {Actions; denies-reports:120008} consistent use of maintenance inhaler -Frequency of rescue inhaler use: *** -Counseled on {CCMINHALERCOUNSELING:25121} -{CCMPHARMDINTERVENTION:25122}   *** (Goal: ***) -{US controlled/uncontrolled:25276} -Current treatment  . ***memantine 5 mg bid  -Medications previously tried: ***  -{CCMPHARMDINTERVENTION:25122}  Health Maintenance -Vaccine gaps: *** COVID, shingrix? flu *** -Current therapy:  . ***cyanocobalamin b-12 1000 mcg/mL 1 mls every 30 days -Educated on {ccm supplement counseling:25128} -{CCM Patient satisfied:25129} -{CCMPHARMDINTERVENTION:25122}  Patient Goals/Self-Care Activities . Patient will:  - {pharmacypatientgoals:24919}  Follow Up Plan: {CM FOLLOW UP CVEL:38101}

## 2020-12-29 ENCOUNTER — Telehealth: Payer: Self-pay

## 2021-01-01 ENCOUNTER — Ambulatory Visit (INDEPENDENT_AMBULATORY_CARE_PROVIDER_SITE_OTHER): Payer: Medicare Other

## 2021-01-01 ENCOUNTER — Telehealth: Payer: Medicare Other

## 2021-01-01 ENCOUNTER — Other Ambulatory Visit: Payer: Self-pay

## 2021-01-01 DIAGNOSIS — I10 Essential (primary) hypertension: Secondary | ICD-10-CM | POA: Diagnosis not present

## 2021-01-01 DIAGNOSIS — E039 Hypothyroidism, unspecified: Secondary | ICD-10-CM

## 2021-01-01 DIAGNOSIS — I482 Chronic atrial fibrillation, unspecified: Secondary | ICD-10-CM

## 2021-01-01 DIAGNOSIS — E782 Mixed hyperlipidemia: Secondary | ICD-10-CM | POA: Diagnosis not present

## 2021-01-01 NOTE — Progress Notes (Signed)
Chronic Care Management Pharmacy Note  01/01/2021 Name:  Lucas Kline MRN:  [id number] DOB:  1929/06/15   Plan Updates:   Patient is complaining of shortness of breath. He would like to consider an inhaler. He has appointment at the end of May and can discuss at that time if you prefer.   Patient states that he struggles with constipation and uses dulcolax and fleet glycerin as needed. Discussed high fiber diet, exercise and hydration to help. Would miralax be appropriate for this patient?   Patient takes verapamil and simvastatin. There is an increased risk of muscle pain when used together. Would you consider changing to rosuvastatin instead to decrease risk? Patient is almost out of medication and willing to make this switch if you agree. Pharmacist will be happy to contact patient's daughter to update her on this change.   Subjective: Lucas Kline is an 87 y.o. year old adult who is a primary patient of Henrene Pastor, Zeb Comfort, MD.  The CCM team was consulted for assistance with disease management and care coordination needs.    Engaged with patient by telephone for initial visit in response to provider referral for pharmacy case management and/or care coordination services.   Consent to Services:  The patient was given the following information about Chronic Care Management services today, agreed to services, and gave verbal consent: 1. CCM service includes personalized support from designated clinical staff supervised by the primary care provider, including individualized plan of care and coordination with other care providers 2. 24/7 contact phone numbers for assistance for urgent and routine care needs. 3. Service will only be billed when office clinical staff spend 20 minutes or more in a month to coordinate care. 4. Only one practitioner may furnish and bill the service in a calendar month. 5.The patient may stop CCM services at any time (effective at the end of the  month) by phone call to the office staff. 6. The patient will be responsible for cost sharing (co-pay) of up to 20% of the service fee (after annual deductible is met). Patient agreed to services and consent obtained.  Patient Care Team: Lillard Anes, MD as PCP - General (Family Medicine) Burnice Logan, [facility] as Pharmacist (Pharmacist)  Recent office visits: 10/23/2020 - Still chronic anemia, glucose 100, kidney tests remain stage 3a, liver tests normal, cholesterol normal, TSH high 4.8, I restarted his thyroid medicine 09/08/2020 - patient has poor memory and refused MMSE. Takes namenda. AWV and immunizations reviewed.  07/24/2020 - Anemia stable, no changes in kidney tests, liver tests normal, Cholesterol normal, TSH 1.6 normal. Recommend hearing evaluation Recent consult visits: 10/23/2020 - call back on home bp meds. Would like to lower to avoid falls. Stop amlodipine and monitor bp  07/24/2020 - cardio - stop amlodipine. BP stable. 1 week monitor is recommended. Reduce simvastatin to 20 mg daily since taking verapamil. Rosuvastatin or Atorvastatin recommended due to verapamil interaction.   Hospital visits: None in previous 6 months  Objective:  Lab Results  Component Value Date   CREATININE 1.27 10/23/2020   BUN 13 10/23/2020   GFRNONAA 50 (L) 10/23/2020   GFRAA 58 (L) 10/23/2020   NA 140 10/23/2020   K 3.9 10/23/2020   CALCIUM 8.6 10/23/2020   CO2 21 10/23/2020   GLUCOSE 100 (H) 10/23/2020    No results found for: HGBA1C, FRUCTOSAMINE, GFR, MICROALBUR  Last diabetic Eye exam: No results found for: HMDIABEYEEXA  Last diabetic Foot exam: No results found for: HMDIABFOOTEX  Lab Results  Component Value Date   CHOL 152 10/23/2020   HDL 44 10/23/2020   LDLCALC 92 10/23/2020   TRIG 83 10/23/2020   CHOLHDL 3.5 10/23/2020    Hepatic Function Latest Ref Rng & Units 10/23/2020 07/24/2020 04/23/2020  Total Protein 6.0 - 8.5 g/dL 6.0 6.2 6.7  Albumin 3.6 - 4.6 g/dL  3.7 4.2 4.4  AST 0 - 40 IU/L '16 15 11  ' ALT 0 - 44 IU/L '9 8 8  ' Alk Phosphatase 44 - 121 IU/L 52 56 58  Total Bilirubin 0.0 - 1.2 mg/dL 0.5 0.6 0.5  Bilirubin, Direct 0.0 - 0.2 mg/dL - - -    Lab Results  Component Value Date/Time   TSH 4.810 (H) 10/23/2020 09:32 AM   TSH 1.630 07/24/2020 08:40 AM    CBC Latest Ref Rng & Units 10/23/2020 07/24/2020 04/23/2020  WBC 3.4 - 10.8 x10E3/uL 3.7 4.8 5.4  Hemoglobin 13.0 - 17.7 g/dL 12.5(L) 12.0(L) 12.9(L)  Hematocrit 37.5 - 51.0 % 35.4(L) 34.9(L) 38.8  Platelets 150 - 450 x10E3/uL 199 178 183    No results found for: VD25OH  Clinical ASCVD: Yes  The ASCVD Risk score [name], et al., 2013) failed to calculate for the following reasons:   The 2013 ASCVD risk score is only valid for ages 40 to 14    Depression screen PHQ 2/9 09/08/2020 01/21/2020 01/21/2020  Decreased Interest 0 0 0  Down, Depressed, Hopeless 0 0 0  PHQ - 2 Score 0 0 0     Social History   Tobacco Use  Smoking Status Former Smoker  . Types: Cigarettes  . Quit date: 2008  . Years since quitting: 14.3  Smokeless Tobacco Current User  . Types: Chew  Tobacco Comment   1 pouch sometimes   BP Readings from Last 3 Encounters:  10/23/20 104/60  10/23/20 100/62  09/08/20 100/60   Pulse Readings from Last 3 Encounters:  10/23/20 78  10/23/20 77  09/08/20 79   Wt Readings from Last 3 Encounters:  10/23/20 112 lb 6.4 oz (51 kg)  10/23/20 118 lb (53.5 kg)  09/08/20 116 lb (52.6 kg)   BMI Readings from Last 3 Encounters:  10/23/20 18.14 kg/m  10/23/20 20.90 kg/m  09/08/20 20.55 kg/m    Assessment/Interventions: Review of patient past medical history, allergies, medications, health status, including review of consultants reports, laboratory and other test data, was performed as part of comprehensive evaluation and provision of chronic care management services.   SDOH:  (Social Determinants of Health) assessments and interventions performed: Yes SDOH  Interventions   Flowsheet Row Most Recent Value  SDOH Interventions   Housing Interventions Intervention Not Indicated     SDOH Screenings   Alcohol Screen: Low Risk   . Last Alcohol Screening Score (AUDIT): 0  Depression (PHQ2-9): Low Risk   . PHQ-2 Score: 0  Financial Resource Strain: Low Risk   . Difficulty of Paying Living Expenses: Not hard at all  Food Insecurity: No Food Insecurity  . Worried About Charity fundraiser in the Last Year: Never true  . Ran Out of Food in the Last Year: Never true  Housing: Low Risk   . Last Housing Risk Score: 0  Physical Activity: Insufficiently Active  . Days of Exercise per Week: 2 days  . Minutes of Exercise per Session: 60 min  Social Connections: Moderately Isolated  . Frequency of Communication with Friends and Family: Once a week  . Frequency of Social Gatherings  with Friends and Family: More than three times a week  . Attends Religious Services: Never  . Active Member of Clubs or Organizations: No  . Attends Archivist Meetings: More than 4 times per year  . Marital Status: Widowed  Stress: No Stress Concern Present  . Feeling of Stress : Not at all  Tobacco Use: High Risk  . Smoking Tobacco Use: Former Smoker  . Smokeless Tobacco Use: Current User  Transportation Needs: No Transportation Needs  . Lack of Transportation (Medical): No  . Lack of Transportation (Non-Medical): No    CCM Care Plan  Allergies  Allergen Reactions  . Aricept [Donepezil] Other (See Comments)    Abdominal pain, diarrhea    Medications Reviewed Today    Reviewed by Burnice Logan, [facility] (Pharmacist) on 01/01/21 at [place] List Status: <None>  Medication Order Taking? Sig Documenting Provider Last Dose Status Informant  cyanocobalamin (,VITAMIN B-12,) 1000 MCG/ML injection [id number] Yes Inject 1 mL (1,000 mcg total) into the muscle every 30 (thirty) days. Lillard Anes, MD Taking Active   levothyroxine (SYNTHROID) 50 MCG tablet  [id number] Yes Take 1 tablet (50 mcg total) by mouth daily. Lillard Anes, MD Taking Active   memantine [facility]) 5 MG tablet [id number] Yes Take 5 mg by mouth 2 (two) times daily. [provider] Taking Active   simvastatin (ZOCOR) 40 MG tablet [id number] Yes Take 20 mg by mouth daily. [provider] Taking Active   verapamil (CALAN-SR) 240 MG CR tablet [id number] Yes TAKE 1 TABLET BY MOUTH EVERY DAY Lillard Anes, MD Taking Active           Patient Active Problem List   Diagnosis Date Noted  . Essential hypertension, benign   . Memory deficit 09/08/2020  . Routine general medical examination at a health care facility 09/08/2020  . B12 deficiency   . CKD (chronic kidney disease), stage II   . Coronary atherosclerosis   . Essential tremor   . Mixed hearing loss, bilateral   . Abnormal EKG 01/28/2020  . Essential hypertension 01/28/2020  . Bilateral carotid bruits 01/28/2020  . Primary hypothyroidism 01/22/2020  . Unstable gait 01/21/2020  . Obstructive chronic bronchitis without exacerbation ([place]) 01/21/2020  . Adult BMI <19 kg/sq m 01/21/2020  . Malnutrition of moderate degree ([place]) 01/21/2020  . Chronic atrial fibrillation ([place]) 01/21/2020  . Personal history of malignant neoplasm of larynx 01/18/2020  . Osteoarthritis   . Alzheimer disease ([place])   . CKD (chronic kidney disease), stage III ([place])   . Atherosclerotic heart disease of native coronary artery without angina pectoris   . Mixed hyperlipidemia     Immunization History  Administered Date(s) Administered  . Pneumococcal Conjugate-13 01/17/2014  . Pneumococcal Polysaccharide-23 01/17/2014  . Tdap 12/29/2011    Conditions to be addressed/monitored:  Hypertension, Hyperlipidemia, Diabetes, COPD and Hypothyroidism  Care Plan : ccm pharmacy care plan  Updates made by Burnice Logan, RPH since 01/01/2021 12:00 AM    Problem: htn, hld, dm, copd, hypothyroidism   Priority: High   Onset Date: 01/01/2021    Long-Range Goal: Disease State Management   Start Date: 01/01/2021  Expected End Date: 01/01/2022  This Visit's Progress: On track  Priority: High  Note:    Current Barriers:  . Unable to achieve control of shortness of breath   Pharmacist Clinical Goal(s):  Marland Kitchen Patient will maintain control of COPD  as evidenced by shortness of breath  through collaboration with PharmD and  provider.   Interventions: . 1:1 collaboration with Lillard Anes, MD regarding development and update of comprehensive plan of care as evidenced by provider attestation and co-signature . Inter-disciplinary care team collaboration (see longitudinal plan of care) . Comprehensive medication review performed; medication list updated in electronic medical record  Hypertension (BP goal <140/90) -Controlled -Current treatment: . verapamil CR 240 mg daily  -Medications previously tried: none reported  -Current home readings:  doesn't have a machine to check bp at home  -Current dietary habits:  Does not enjoy boost or ensure. Eats a lot of hamburger for protein source. Doesn't drink water. Drinks decaf coffee with each meal. Some water between. Doesn't prefer chicken. Eats hamburgers and hot dogs. Eats fruits and vegetables.  -Current exercise habits: Walks about mile most days as long as it's not too cold or raining.  -Denies hypotensive/hypertensive symptoms -Educated on BP goals and benefits of medications for prevention of heart attack, stroke and kidney damage; Daily salt intake goal < 2300 mg; Exercise goal of 150 minutes per week; Importance of home blood pressure monitoring; -Counseled to monitor BP at home as needed, document, and provide log at future appointments -Counseled on diet and exercise extensively Recommended to continue current medication Recommended ordering a blood pressure monitor through insurance catalog.   Hyperlipidemia: (LDL goal <  100) -Controlled -Current treatment: . simvastatin 20 mg daily (1/2 of 40 mg)  atorvastatin/rosuvastatin suggested due to verapamil -Medications previously tried: none reported  -Current dietary patterns: doesn't like chicken. Eats ground beef, hamburgers and hot dogs.  -Current exercise habits: walks 1-2 miles most days.  -Educated on Cholesterol goals;  Benefits of statin for ASCVD risk reduction; Importance of limiting foods high in cholesterol; Exercise goal of 150 minutes per week; -Counseled on diet and exercise extensively Recommended consider changing to atorvastatin or rosuvastatin due to interaction with verapamil.   Atrial Fibrillation (Goal: prevent stroke and major bleeding) -Controlled -CHADSVASC: 5 -Current treatment: . Rate control: verapamil cr 240 mg daily  . Anticoagulation: nothing currently -Medications previously tried: not reported -Home BP and HR readings: not checking at home   -Counseled on increased risk of stroke due to Afib and benefits of anticoagulation for stroke prevention; -Counseled on diet and exercise extensively Recommended to continue current medication  COPD (Goal: control symptoms and prevent exacerbations) -Not ideally controlled -Current treatment  . none currently  -Medications previously tried: none reported  -Gold Grade: Gold 2 (FEV1 50-79%) -Pulmonary function testing: 01/21/2020 - FEV1 - 57% -Exacerbations requiring treatment in last 6 months: n/a -Patient denies consistent use of maintenance inhaler -Frequency of rescue inhaler use: doesn't have one  -Recommended discussing need for inhaler or treatment for shortness of breath.    Alzheimer Disease (Goal: maintain memory) -Controlled -Current treatment  . memantine 5 mg bid  -Medications previously tried: donepezil  -Recommended to continue current medication   Constipation (Goal: manage symptoms of constipation) -Not ideally controlled -Current treatment  . eats  prunes . OTC laxative from [facility] - fleet glycerin and dulcolax overnight - prn -Medications previously tried: none reported  -Counseled on diet and exercise extensively Collaborated with Dr. Henrene Pastor to discuss option of miralax.   Recommend diet in High fiber foods and water. Patient states Pinto beans helps some   Health Maintenance -Vaccine gaps:  COVID - hasn't had one and does not want one, shingrix - declines,  flu - declines   -Current therapy:  . cyanocobalamin b-12 1000 mcg/mL 1 mls every 30 days -Patient  is satisfied with current therapy and denies issues -Recommended to continue current medication  Patient Goals/Self-Care Activities . Patient will:  - take medications as prescribed focus on medication adherence by using pill box target a minimum of 150 minutes of moderate intensity exercise weekly  Follow Up Plan: Telephone follow up appointment with care management team member scheduled for:11/2021      Medication Assistance: None required.  Patient affirms current coverage meets needs.  Patient's preferred pharmacy is:  [id number] [place] [id number]: [phone]: [phone] Uses pill box? Yes Pt endorses good compliance - daughter is filling pill box and patient doing well  We discussed: Benefits of medication synchronization, packaging and delivery as well as enhanced pharmacist oversight with [employer]. Patient decided to: Continue current medication management strategy  Care Plan and Follow Up Patient Decision:  Patient agrees to Care Plan and Follow-up.  Plan: Telephone follow up appointment with care management team member scheduled for:  11/2021

## 2021-01-01 NOTE — Patient Instructions (Addendum)
Visit Information  Thank you for your time discussing your medications. I look forward to working with you to achieve your health care goals. Below is a summary of what we talked about during our visit.   Goals Addressed            This Visit's Progress   . Learn More About My Health       Timeframe:  Long-Range Goal Priority:  High Start Date:                             Expected End Date:                        Follow Up Date 12/2021    - tell my story and reason for my visit - ask questions - bring a list of my medicines to the visit    Why is this important?    The best way to learn about your health and care is by talking to the doctor and nurse.   They will answer your questions and give you information in the way that you like best.    Notes:     Marland Kitchen Manage My Medicine       Timeframe:  Long-Range Goal Priority:  High Start Date:                             Expected End Date:                       Follow Up Date 12/2021    - call for medicine refill 2 or 3 days before it runs out - keep a list of all the medicines I take; vitamins and herbals too - use a pillbox to sort medicine    Why is this important?   . These steps will help you keep on track with your medicines.   Notes:     . Track and Manage Heart Rate and Rhythm-Atrial Fibrillation       Timeframe:  Long-Range Goal Priority:  High Start Date:                             Expected End Date:                       Follow Up Date 12/2021    - take medicine as prescribed    Why is this important?    Atrial fibrillation may have no symptoms. Sometimes the symptoms get worse or happen more often.   It is important to keep track of what your symptoms are and when they happen.   A change in symptoms is important to discuss with your doctor or nurse.   Being active and healthy eating will also help you manage your heart condition.     Notes:     . Track and Manage My Triggers-COPD        Timeframe:  Long-Range Goal Priority:  High Start Date:                             Expected End Date:  Follow Up Date 12/2021    - limit outdoor activity during cold weather - listen for public air quality announcements every day    Why is this important?    Triggers are activities or things, like tobacco smoke or cold weather, that make your COPD (chronic obstructive pulmonary disease) flare-up.   Knowing these triggers helps you plan how to stay away from them.   When you cannot remove them, you can learn how to manage them.     Notes:        Patient Care Plan: ccm pharmacy care plan    Problem Identified: htn, hld, dm, copd, hypothyroidism   Priority: High  Onset Date: 01/01/2021    Long-Range Goal: Disease State Management   Start Date: 01/01/2021  Expected End Date: 01/01/2022  This Visit's Progress: On track  Priority: High  Note:    Current Barriers:  . Unable to achieve control of shortness of breath   Pharmacist Clinical Goal(s):  Marland Kitchen Patient will maintain control of COPD  as evidenced by shortness of breath  through collaboration with PharmD and provider.   Interventions: . 1:1 collaboration with Lillard Anes, MD regarding development and update of comprehensive plan of care as evidenced by provider attestation and co-signature . Inter-disciplinary care team collaboration (see longitudinal plan of care) . Comprehensive medication review performed; medication list updated in electronic medical record  Hypertension (BP goal <140/90) -Controlled -Current treatment: . verapamil CR 240 mg daily  -Medications previously tried: none reported  -Current home readings:  doesn't have a machine to check bp at home  -Current dietary habits:  Does not enjoy boost or ensure. Eats a lot of hamburger for protein source. Doesn't drink water. Drinks decaf coffee with each meal. Some water between. Doesn't prefer chicken. Eats hamburgers and hot  dogs. Eats fruits and vegetables.  -Current exercise habits: Walks about mile most days as long as it's not too cold or raining.  -Denies hypotensive/hypertensive symptoms -Educated on BP goals and benefits of medications for prevention of heart attack, stroke and kidney damage; Daily salt intake goal < 2300 mg; Exercise goal of 150 minutes per week; Importance of home blood pressure monitoring; -Counseled to monitor BP at home as needed, document, and provide log at future appointments -Counseled on diet and exercise extensively Recommended to continue current medication Recommended ordering a blood pressure monitor through insurance catalog.   Hyperlipidemia: (LDL goal < 100) -Controlled -Current treatment: . simvastatin 20 mg daily (1/2 of 40 mg)  atorvastatin/rosuvastatin suggested due to verapamil -Medications previously tried: none reported  -Current dietary patterns: doesn't like chicken. Eats ground beef, hamburgers and hot dogs.  -Current exercise habits: walks 1-2 miles most days.  -Educated on Cholesterol goals;  Benefits of statin for ASCVD risk reduction; Importance of limiting foods high in cholesterol; Exercise goal of 150 minutes per week; -Counseled on diet and exercise extensively Recommended consider changing to atorvastatin or rosuvastatin due to interaction with verapamil.   Atrial Fibrillation (Goal: prevent stroke and major bleeding) -Controlled -CHADSVASC: 5 -Current treatment: . Rate control: verapamil cr 240 mg daily  . Anticoagulation: nothing currently -Medications previously tried: not reported -Home BP and HR readings: not checking at home   -Counseled on increased risk of stroke due to Afib and benefits of anticoagulation for stroke prevention; -Counseled on diet and exercise extensively Recommended to continue current medication  COPD (Goal: control symptoms and prevent exacerbations) -Not ideally controlled -Current treatment  . none currently   -Medications previously  tried: none reported  -Gold Grade: Gold 2 (FEV1 50-79%) -Pulmonary function testing: 01/21/2020 - FEV1 - 57% -Exacerbations requiring treatment in last 6 months: n/a -Patient denies consistent use of maintenance inhaler -Frequency of rescue inhaler use: doesn't have one  -Recommended discussing need for inhaler or treatment for shortness of breath.    Alzheimer Disease (Goal: maintain memory) -Controlled -Current treatment  . memantine 5 mg bid  -Medications previously tried: donepezil  -Recommended to continue current medication   Constipation (Goal: manage symptoms of constipation) -Not ideally controlled -Current treatment  . eats prunes . OTC laxative from [facility] - fleet glycerin and dulcolax overnight - prn -Medications previously tried: none reported  -Counseled on diet and exercise extensively Collaborated with Dr. Henrene Pastor to discuss option of miralax.   Recommend diet in High fiber foods and water. Patient states Pinto beans helps some   Health Maintenance -Vaccine gaps:  COVID - hasn't had one and does not want one, shingrix - declines,  flu - declines   -Current therapy:  . cyanocobalamin b-12 1000 mcg/mL 1 mls every 30 days -Patient is satisfied with current therapy and denies issues -Recommended to continue current medication  Patient Goals/Self-Care Activities . Patient will:  - take medications as prescribed focus on medication adherence by using pill box target a minimum of 150 minutes of moderate intensity exercise weekly  Follow Up Plan: Telephone follow up appointment with care management team member scheduled for:11/2021        Lucas Kline was given information about Chronic Care Management services today including:  1. CCM service includes personalized support from designated clinical staff supervised by [name] "Bill"'s physician, including individualized plan of care and coordination with other care providers 2. 24/7  contact phone numbers for assistance for urgent and routine care needs. 3. Standard insurance, coinsurance, copays and deductibles apply for chronic care management only during months in which we provide at least 20 minutes of these services. Most insurances cover these services at 100%, however patients may be responsible for any copay, coinsurance and/or deductible if applicable. This service may help you avoid the need for more expensive face-to-face services. 4. Only one practitioner may furnish and bill the service in a calendar month. 5. The patient may stop CCM services at any time (effective at the end of the month) by phone call to the office staff.  Patient agreed to services and verbal consent obtained.   Patient verbalizes understanding of instructions provided today and agrees to view in [place].  Telephone follow up appointment with pharmacy team member scheduled for: 11/2021  Sherre Poot, PharmD Clinical Pharmacist [facility] [phone] (office) [phone] (mobile)    Constipation, Adult Constipation is when a person has trouble pooping (having a bowel movement). When you have this condition, you may poop fewer than 3 times a week. Your poop (stool) may also be dry, hard, or bigger than normal. Follow these instructions at home: Eating and drinking  Eat foods that have a lot of fiber, such as: ? Fresh fruits and vegetables. ? Whole grains. ? Beans.  Eat less of foods that are low in fiber and high in fat and sugar, such as: ? [place] fries. ? Hamburgers. ? Cookies. ? Candy. ? Soda.  Drink enough fluid to keep your pee (urine) pale yellow.   General instructions  Exercise regularly or as told by your doctor. Try to do 150 minutes of exercise each week.  Go to the restroom when you feel like you  need to poop. Do not hold it in.  Take over-the-counter and prescription medicines only as told by your doctor. These include any fiber  supplements.  When you poop: ? Do deep breathing while relaxing your lower belly (abdomen). ? Relax your pelvic floor. The pelvic floor is a group of muscles that support the rectum, bladder, and intestines (as well as the uterus in women).  Watch your condition for any changes. Tell your doctor if you notice any.  Keep all follow-up visits as told by your doctor. This is important. Contact a doctor if:  You have pain that gets worse.  You have a fever.  You have not pooped for 4 days.  You vomit.  You are not hungry.  You lose weight.  You are bleeding from the opening of the butt (anus).  You have thin, pencil-like poop. Get help right away if:  You have a fever, and your symptoms suddenly get worse.  You leak poop or have blood in your poop.  Your belly feels hard or bigger than normal (bloated).  You have very bad belly pain.  You feel dizzy or you faint. Summary  Constipation is when a person poops fewer than 3 times a week, has trouble pooping, or has poop that is dry, hard, or bigger than normal.  Eat foods that have a lot of fiber.  Drink enough fluid to keep your pee (urine) pale yellow.  Take over-the-counter and prescription medicines only as told by your doctor. These include any fiber supplements. This information is not intended to replace advice given to you by your health care provider. Make sure you discuss any questions you have with your health care provider. Document Revised: 07/18/2019 Document Reviewed: 07/18/2019 Elsevier Patient Education  [place].

## 2021-01-01 NOTE — Progress Notes (Signed)
Entered in error

## 2021-01-02 ENCOUNTER — Telehealth: Payer: Self-pay

## 2021-01-02 ENCOUNTER — Other Ambulatory Visit: Payer: Self-pay

## 2021-01-02 MED ORDER — POLYETHYLENE GLYCOL 3350 17 GM/SCOOP PO POWD: 17.0000 g | Freq: Every day | ORAL | 2 refills | Status: DC

## 2021-01-02 NOTE — Progress Notes (Addendum)
    Chronic Care Management Pharmacy Assistant   Name: Lucas Kline  MRN: [id number] DOB: Jun 05, 1929  Reason for Encounter: Medication Review for Miralax     Medications: Outpatient Encounter Medications as of 01/02/2021  Medication Sig   cyanocobalamin (,VITAMIN B-12,) 1000 MCG/ML injection Inject 1 mL (1,000 mcg total) into the muscle every 30 (thirty) days.   Glycerin, Laxative, (FLEET LIQUID GLYCERIN SUPP RE) Place rectally daily as needed.   levothyroxine (SYNTHROID) 50 MCG tablet Take 1 tablet (50 mcg total) by mouth daily.   Magnesium Hydroxide (DULCOLAX PO) Take 1 tablet by mouth daily as needed.   memantine (NAMENDA) 5 MG tablet Take 5 mg by mouth 2 (two) times daily.   simvastatin (ZOCOR) 40 MG tablet Take 20 mg by mouth daily.   verapamil (CALAN-SR) 240 MG CR tablet TAKE 1 TABLET BY MOUTH EVERY DAY   No facility-administered encounter medications on file as of 01/02/2021.   Patient had appointment with Donette Larry, CPP on 01/01/21.    Patient had a question about remaining on his Simvastatin and constipation issues.  Donette Larry, CPP stated that Dr. Henrene Pastor said the patient is to continue on the medication and add 1 capful of Miralax with water daily as needed.  Anderson Malta (daughter) said she got some [employer] pancake mix to try that has protein in it.   I have contacted the patient daughter to let her know instructions Anderson Malta (daughter) said she got some [employer] pancake mix to try that has protein in it.   Clarita Leber, [place] Pharmacist Assistant [phone]

## 2021-02-06 ENCOUNTER — Ambulatory Visit: Payer: Medicare Other | Admitting: Legal Medicine

## 2021-02-13 ENCOUNTER — Ambulatory Visit: Payer: Medicare Other | Admitting: Legal Medicine

## 2021-02-23 ENCOUNTER — Ambulatory Visit (INDEPENDENT_AMBULATORY_CARE_PROVIDER_SITE_OTHER): Payer: Medicare Other | Admitting: Legal Medicine

## 2021-02-23 ENCOUNTER — Encounter: Payer: Self-pay | Admitting: Legal Medicine

## 2021-02-23 ENCOUNTER — Other Ambulatory Visit: Payer: Self-pay

## 2021-02-23 VITALS — BP 100/50 | HR 73 | Temp 97.6°F | Resp 15 | Ht 66.0 in | Wt 113.0 lb

## 2021-02-23 DIAGNOSIS — I2510 Atherosclerotic heart disease of native coronary artery without angina pectoris: Secondary | ICD-10-CM

## 2021-02-23 DIAGNOSIS — Z681 Body mass index (BMI) 19 or less, adult: Secondary | ICD-10-CM | POA: Diagnosis not present

## 2021-02-23 DIAGNOSIS — J449 Chronic obstructive pulmonary disease, unspecified: Secondary | ICD-10-CM

## 2021-02-23 DIAGNOSIS — E440 Moderate protein-calorie malnutrition: Secondary | ICD-10-CM

## 2021-02-23 DIAGNOSIS — E039 Hypothyroidism, unspecified: Secondary | ICD-10-CM | POA: Diagnosis not present

## 2021-02-23 DIAGNOSIS — N1831 Chronic kidney disease, stage 3a: Secondary | ICD-10-CM

## 2021-02-23 DIAGNOSIS — I10 Essential (primary) hypertension: Secondary | ICD-10-CM | POA: Diagnosis not present

## 2021-02-23 DIAGNOSIS — E782 Mixed hyperlipidemia: Secondary | ICD-10-CM | POA: Diagnosis not present

## 2021-02-23 DIAGNOSIS — H906 Mixed conductive and sensorineural hearing loss, bilateral: Secondary | ICD-10-CM

## 2021-02-23 DIAGNOSIS — G309 Alzheimer's disease, unspecified: Secondary | ICD-10-CM | POA: Diagnosis not present

## 2021-02-23 DIAGNOSIS — I482 Chronic atrial fibrillation, unspecified: Secondary | ICD-10-CM | POA: Diagnosis not present

## 2021-02-23 DIAGNOSIS — F0280 Dementia in other diseases classified elsewhere without behavioral disturbance: Secondary | ICD-10-CM

## 2021-02-23 NOTE — Progress Notes (Signed)
Established Patient Office Visit  Subjective:  Patient ID: Lucas Kline, adult    DOB: 25-Nov-1928  Age: 87 y.o. MRN: [id number]  CC:  Chief Complaint  Patient presents with   Hypothyroidism   Hyperlipidemia   Atrial Fibrillation     HPI Lucas Kline presents for chronic visit  Patient has HYPOTHYROIDISM.  Diagnosed 20 years ago.  Patient has stable thyroid readings.  Patient is having no symtoms.  Last TSH was normal.  continue dosage of thyroid medicine.   Patient presents with hyperlipidemia.  Compliance with treatment has been good; patient takes medicines as directed, maintains low cholesterol diet, follows up as directed, and maintains exercise regimen.  Patient is using simvastatin without problems.   Patient has chronic atrial fibrillation no anticoagulation, fall risk  Past Medical History:  Diagnosis Date   Abnormal EKG 01/28/2020   Adult BMI <19 kg/sq m 01/21/2020   Alzheimer disease ([place])    Atherosclerotic heart disease of native coronary artery without angina pectoris    CHRONIC    B12 deficiency    CHRONIC   Bilateral carotid bruits 01/28/2020   Chronic atrial fibrillation ([place]) 01/21/2020   CKD (chronic kidney disease), stage II    MILD   CKD (chronic kidney disease), stage III (HCC)    MODERATE (CHRONIC)   Coronary atherosclerosis    CHRONIC   Essential hypertension 01/28/2020   Essential hypertension, benign    Essential tremor    CHRONIC   Malnutrition of moderate degree ([place]) 01/21/2020   Memory deficit 09/08/2020   Mixed hearing loss, bilateral    CHRONIC   Mixed hyperlipidemia    CHRONIC    Obstructive chronic bronchitis without exacerbation ([place]) 01/21/2020   Osteoarthritis    OF KNEE CHRONIC   Personal history of malignant neoplasm of larynx 01/18/2020   Primary hypothyroidism 01/22/2020   Routine general medical examination at a health care facility 09/08/2020   Unspecified arthropathy, lower leg    Unspecified constipation     CHRONIC   Unstable gait 01/21/2020    Past Surgical History:  Procedure Laterality Date   CATARACT EXTRACTION, BILATERAL      Family History  Problem Relation Age of Onset   Hypertension Neg Hx    Heart disease Neg Hx    Cancer Neg Hx    Diabetes Neg Hx     Social History   Socioeconomic History   Marital status: Widowed    Spouse name: Not on file   Number of children: 2   Years of education: Not on file   Highest education level: Not on file  Occupational History   Occupation: retired   Occupation: Copywriter, advertising  Tobacco Use   Smoking status: Former    Pack years: 0.00    Types: Cigarettes    Quit date: 2008    Years since quitting: 14.4   Smokeless tobacco: Current    Types: Chew   Tobacco comments:    1 pouch sometimes  Vaping Use   Vaping Use: Never used  Substance and Sexual Activity   Alcohol use: No   Drug use: No   Sexual activity: Yes  Other Topics Concern   Not on file  Social History Narrative   Not on file   Social Determinants of Health   Financial Resource Strain: Low Risk    Difficulty of Paying Living Expenses: Not hard at all  Food Insecurity: No Food Insecurity   Worried About Charity fundraiser in the  Last Year: Never true   Ran Out of Food in the Last Year: Never true  Transportation Needs: No Transportation Needs   Lack of Transportation (Medical): No   Lack of Transportation (Non-Medical): No  Physical Activity: Insufficiently Active   Days of Exercise per Week: 2 days   Minutes of Exercise per Session: 60 min  Stress: No Stress Concern Present   Feeling of Stress : Not at all  Social Connections: Moderately Isolated   Frequency of Communication with Friends and Family: Once a week   Frequency of Social Gatherings with Friends and Family: More than three times a week   Attends Religious Services: Never   Marine scientist or Organizations: No   Attends Music therapist: More than 4 times per year    Marital Status: Widowed  Human resources officer Violence: Not At Risk   Fear of Current or Ex-Partner: No   Emotionally Abused: No   Physically Abused: No   Sexually Abused: No    Outpatient Medications Prior to Visit  Medication Sig Dispense Refill   cyanocobalamin (,VITAMIN B-12,) 1000 MCG/ML injection Inject 1 mL (1,000 mcg total) into the muscle every 30 (thirty) days. 3 mL 6   Glycerin, Laxative, (FLEET LIQUID GLYCERIN SUPP RE) Place rectally daily as needed.     levothyroxine (SYNTHROID) 50 MCG tablet Take 1 tablet (50 mcg total) by mouth daily. 90 tablet 3   Magnesium Hydroxide (DULCOLAX PO) Take 1 tablet by mouth daily as needed.     memantine (NAMENDA) 5 MG tablet Take 5 mg by mouth 2 (two) times daily.     polyethylene glycol powder (GLYCOLAX/MIRALAX) 17 GM/SCOOP powder Take 17 g by mouth daily. 17 grams daily mixed in coffee or beverage of choice. 255 g 2   simvastatin (ZOCOR) 20 MG tablet Take 20 mg by mouth daily.     verapamil (CALAN-SR) 240 MG CR tablet TAKE 1 TABLET BY MOUTH EVERY DAY 90 tablet 2   simvastatin (ZOCOR) 40 MG tablet Take 20 mg by mouth daily.     No facility-administered medications prior to visit.    Allergies  Allergen Reactions   Aricept [Donepezil] Other (See Comments)    Abdominal pain, diarrhea    ROS Review of Systems  Constitutional:  Negative for activity change, appetite change, chills, fatigue and fever.  HENT:  Positive for hearing loss. Negative for congestion, ear pain and sore throat.   Eyes:  Negative for visual disturbance.  Respiratory:  Negative for cough and shortness of breath.   Cardiovascular:  Negative for chest pain and leg swelling.  Gastrointestinal:  Negative for abdominal pain, constipation, diarrhea, nausea and vomiting.  Endocrine: Negative for polydipsia, polyphagia and polyuria.  Genitourinary:  Negative for dysuria and frequency.  Musculoskeletal:  Negative for arthralgias and myalgias.  Skin: Negative.    Neurological:  Negative for dizziness and headaches.  Psychiatric/Behavioral:  Negative for dysphoric mood.        No dysphoria, forgetful  All other systems reviewed and are negative.    Objective:    Physical Exam Vitals reviewed.  Constitutional:      Appearance: Normal appearance. Lucas Dessert Fanton "Rush Landmark" is normal weight.  HENT:     Head: Normocephalic and atraumatic.     Right Ear: Tympanic membrane, ear canal and external ear normal.     Left Ear: Tympanic membrane, ear canal and external ear normal.     Mouth/Throat:     Mouth: Mucous membranes are moist.  Pharynx: Oropharynx is clear.  Eyes:     Extraocular Movements: Extraocular movements intact.     Conjunctiva/sclera: Conjunctivae normal.     Pupils: Pupils are equal, round, and reactive to light.  Cardiovascular:     Rate and Rhythm: Normal rate. Rhythm irregular.     Pulses: Normal pulses.     Heart sounds: Normal heart sounds. No murmur heard.   No gallop.  Pulmonary:     Effort: Pulmonary effort is normal.     Breath sounds: Normal breath sounds.  Abdominal:     General: Abdomen is flat.     Palpations: Abdomen is soft.     Tenderness: There is no abdominal tenderness. There is no guarding.  Musculoskeletal:     Cervical back: Normal range of motion and neck supple.  Skin:    General: Skin is warm.     Capillary Refill: Capillary refill takes less than 2 seconds.  Neurological:     General: No focal deficit present.     Mental Status: Lucas Likes [facility] "Rush Landmark" is alert and oriented to person, place, and time. Mental status is at baseline.  Psychiatric:        Mood and Affect: Mood normal.        Behavior: Behavior normal.    BP (!) 100/50   Pulse 73   Temp 97.6 F (36.4 C)   Resp 15   Ht 5\' 6"  (1.676 m)   Wt 113 lb (51.3 kg)   SpO2 93%   BMI 18.24 kg/m  Wt Readings from Last 3 Encounters:  02/23/21 113 lb (51.3 kg)  10/23/20 112 lb 6.4 oz (51 kg)  10/23/20 118 lb (53.5 kg)      Health Maintenance Due  Topic Date Due   Zoster Vaccines- Shingrix (1 of 2) Never done   DEXA SCAN  Never done    There are no preventive care reminders to display for this patient.  Lab Results  Component Value Date   TSH 4.810 (H) 10/23/2020   Lab Results  Component Value Date   WBC 3.7 10/23/2020   HGB 12.5 (L) 10/23/2020   HCT 35.4 (L) 10/23/2020   MCV 92 10/23/2020   PLT 199 10/23/2020   Lab Results  Component Value Date   NA 140 10/23/2020   K 3.9 10/23/2020   CO2 21 10/23/2020   GLUCOSE 100 (H) 10/23/2020   BUN 13 10/23/2020   CREATININE 1.27 10/23/2020   BILITOT 0.5 10/23/2020   ALKPHOS 52 10/23/2020   AST 16 10/23/2020   ALT 9 10/23/2020   PROT 6.0 10/23/2020   ALBUMIN 3.7 10/23/2020   CALCIUM 8.6 10/23/2020   ANIONGAP 9 02/12/2019   Lab Results  Component Value Date   CHOL 152 10/23/2020   Lab Results  Component Value Date   HDL 44 10/23/2020   Lab Results  Component Value Date   LDLCALC 92 10/23/2020   Lab Results  Component Value Date   TRIG 83 10/23/2020   Lab Results  Component Value Date   CHOLHDL 3.5 10/23/2020   No results found for: HGBA1C    Assessment & Plan:   Problem List Items Addressed This Visit       Cardiovascular and Mediastinum   Atherosclerotic heart disease of native coronary artery without angina pectoris   Relevant Medications   simvastatin (ZOCOR) 20 MG tablet An individual plan was formulated based on patient history and exam, labs and evidence based data. Patient has not had recent angina  or nitroglycerin use. continue present treatment.     Chronic atrial fibrillation (HCC)   Relevant Medications   simvastatin (ZOCOR) 20 MG tablet Patient has a diagnosis of permanent atrial fibrillation.   Patient is on none  and has controlled ventricular response.  Patient is CV stable .    Essential hypertension, benign   Relevant Medications   simvastatin (ZOCOR) 20 MG tablet   Other Relevant Orders    Comprehensive metabolic panel   CBC with Differential/Platelet An individual hypertension care plan was established and reinforced today.  The patient's status was assessed using clinical findings on exam and labs or diagnostic tests. The patient's success at meeting treatment goals on disease specific evidence-based guidelines and found to be well controlled. SELF MANAGEMENT: The patient and I together assessed ways to personally work towards obtaining the recommended goals. RECOMMENDATIONS: avoid decongestants found in common cold remedies, decrease consumption of alcohol, perform routine monitoring of BP with home BP cuff, exercise, reduction of dietary salt, take medicines as prescribed, try not to miss doses and quit smoking.  Regular exercise and maintaining a healthy weight is needed.  Stress reduction may help. A CLINICAL SUMMARY including written plan identify barriers to care unique to individual due to social or financial issues.  We attempt to mutually creat solutions for individual and family understanding.      Respiratory   Obstructive chronic bronchitis without exacerbation ([place]) An individualize plan was formulated for care of COPD.  Treatment is evidence based.  She will continue on inhalers, avoid smoking and smoke.  Regular exercise with help with dyspnea. Routine follow ups and medication compliance is needed.      Endocrine   Primary hypothyroidism - Primary   Relevant Orders   TSH Patient is known to have hypothyroidism and is 0n treatment with levothyroxine 85mcg.  Patient was diagnosed 10 years ago.  Other treatment includes none.  Patient is compliant with medicines and last TSH 6 months ago.  Last TSH was normal.      Nervous and Auditory   Alzheimer disease ([place]) Patient's dementia is stable MMSE remains 26/30.  He remains independent without supervision for medicines.  An individual plan was formulated for this patient based on patient history and physical exam, x-rays  an other tests.  Evidence based decisions made.      Genitourinary   CKD (chronic kidney disease), stage III (HCC) AN INDIVIDUAL CARE PLAN for chronic renal disease was established and reinforced today.  The patient's status was assessed using clinical findings on exam, labs, and other diagnostic testing. Patient's success at meeting treatment goals based on disease specific evidence-bassed guidelines and found to be in fair control. RECOMMENDATIONS include maintaining present medicines and treatment.      Other   Mixed hyperlipidemia   Relevant Medications   simvastatin (ZOCOR) 20 MG tablet   Other Relevant Orders   Lipid panel AN INDIVIDUAL CARE PLAN for hyperlipidemia/ cholesterol was established and reinforced today.  The patient's status was assessed using clinical findings on exam, lab and other diagnostic tests. The patient's disease status was assessed based on evidence-based guidelines and found to be well controlled. MEDICATIONS were reviewed. SELF MANAGEMENT GOALS have been discussed and patient's success at attaining the goal of low cholesterol was assessed. RECOMMENDATION given include regular exercise 3 days a week and low cholesterol/low fat diet. CLINICAL SUMMARY including written plan to identify barriers unique to the patient due to social or economic  reasons was discussed.  Adult BMI <19 kg/sq m Supplement nutrition with protein/calorie supplement with meals to improve nutritional status.    Other Visit Diagnoses     Alzheimer's dementia without behavioral disturbance, unspecified timing of dementia onset ([place])     Same as above    Moderate protein-calorie malnutrition ([place])    Supplement nutrition with protein/calorie supplement with meals to improve nutritional status.      Mixed conductive and sensorineural hearing loss of both ears     Patient needs new hearing aids          Follow-up: Return in about 6 months (around 08/25/2021) for fasting.     Reinaldo Meeker, MD

## 2021-02-24 ENCOUNTER — Other Ambulatory Visit: Payer: Self-pay

## 2021-02-24 LAB — CBC WITH DIFFERENTIAL/PLATELET
Basophils Absolute: 0 10*3/uL (ref 0.0–0.2)
Basos: 1 %
EOS (ABSOLUTE): 0.3 10*3/uL (ref 0.0–0.4)
Eos: 5 %
Hematocrit: 36.8 % — ABNORMAL LOW (ref 37.5–51.0)
Hemoglobin: 12.3 g/dL — ABNORMAL LOW (ref 13.0–17.7)
Immature Grans (Abs): 0 10*3/uL (ref 0.0–0.1)
Immature Granulocytes: 0 %
Lymphocytes Absolute: 1.3 10*3/uL (ref 0.7–3.1)
Lymphs: 24 %
MCH: 31.5 pg (ref 26.6–33.0)
MCHC: 33.4 g/dL (ref 31.5–35.7)
MCV: 94 fL (ref 79–97)
Monocytes Absolute: 0.5 10*3/uL (ref 0.1–0.9)
Monocytes: 10 %
Neutrophils Absolute: 3.2 10*3/uL (ref 1.4–7.0)
Neutrophils: 60 %
Platelets: 168 10*3/uL (ref 150–450)
RBC: 3.9 x10E6/uL — ABNORMAL LOW (ref 4.14–5.80)
RDW: 12.2 % (ref 11.6–15.4)
WBC: 5.3 10*3/uL (ref 3.4–10.8)

## 2021-02-24 LAB — COMPREHENSIVE METABOLIC PANEL
ALT: 6 IU/L (ref 0–44)
AST: 11 IU/L (ref 0–40)
Albumin/Globulin Ratio: 1.6 (ref 1.2–2.2)
Albumin: 3.8 g/dL (ref 3.6–4.6)
Alkaline Phosphatase: 57 IU/L (ref 44–121)
BUN/Creatinine Ratio: 13 (ref 10–24)
BUN: 15 mg/dL (ref 8–27)
Bilirubin Total: 0.3 mg/dL (ref 0.0–1.2)
CO2: 20 mmol/L (ref 20–29)
Calcium: 8.8 mg/dL (ref 8.6–10.2)
Chloride: 105 mmol/L (ref 96–106)
Creatinine, Ser: 1.16 mg/dL (ref 0.76–1.27)
Globulin, Total: 2.4 g/dL (ref 1.5–4.5)
Glucose: 86 mg/dL (ref 65–99)
Potassium: 4.3 mmol/L (ref 3.5–5.2)
Sodium: 139 mmol/L (ref 134–144)
Total Protein: 6.2 g/dL (ref 6.0–8.5)
eGFR: 60 mL/min/{1.73_m2} (ref >59)

## 2021-02-24 LAB — LIPID PANEL
Chol/HDL Ratio: 2.6 ratio (ref 0.0–5.0)
Cholesterol, Total: 144 mg/dL (ref 100–199)
HDL: 55 mg/dL (ref >39)
LDL Chol Calc (NIH): 75 mg/dL (ref 0–99)
Triglycerides: 69 mg/dL (ref 0–149)
VLDL Cholesterol Cal: 14 mg/dL (ref 5–40)

## 2021-02-24 LAB — CARDIOVASCULAR RISK ASSESSMENT

## 2021-02-24 LAB — TSH: TSH: 2.59 u[IU]/mL (ref 0.450–4.500)

## 2021-02-24 NOTE — Progress Notes (Signed)
Kidney and liver tests normal, Cholesterol normal, chronic anemia, TSH 2.59 normal,  lp

## 2021-03-03 DIAGNOSIS — H5203 Hypermetropia, bilateral: Secondary | ICD-10-CM | POA: Diagnosis not present

## 2021-03-03 DIAGNOSIS — H04123 Dry eye syndrome of bilateral lacrimal glands: Secondary | ICD-10-CM | POA: Diagnosis not present

## 2021-03-13 ENCOUNTER — Telehealth: Payer: Self-pay

## 2021-03-13 NOTE — Progress Notes (Unsigned)
    Chronic Care Management Pharmacy Assistant   Name: [name]  MRN: [id number] DOB: 26-Sep-1928  Lucas Kline is an 87 y.o. year old adult who presents for his follow-up CCM visit with the clinical pharmacist.  Reason for Encounter: General adherence call     Recent office visits:  02/23/2021: Dr. Henrene Pastor / renew rx for Simvastatin 20mg  daily   Recent consult visits:  None noted since last CCM visit   Hospital visits:  None noted since last CCM visit   Medications: Outpatient Encounter Medications as of 03/13/2021  Medication Sig   cyanocobalamin (,VITAMIN B-12,) 1000 MCG/ML injection Inject 1 mL (1,000 mcg total) into the muscle every 30 (thirty) days.   Glycerin, Laxative, (FLEET LIQUID GLYCERIN SUPP RE) Place rectally daily as needed.   levothyroxine (SYNTHROID) 50 MCG tablet Take 1 tablet (50 mcg total) by mouth daily.   Magnesium Hydroxide (DULCOLAX PO) Take 1 tablet by mouth daily as needed.   memantine (NAMENDA) 5 MG tablet Take 5 mg by mouth 2 (two) times daily.   simvastatin (ZOCOR) 20 MG tablet Take 20 mg by mouth daily.   verapamil (CALAN-SR) 240 MG CR tablet TAKE 1 TABLET BY MOUTH EVERY DAY   No facility-administered encounter medications on file as of 03/13/2021.   Contacted [name] for general disease state and medication adherence call.   Patient is not > 5 days past due for refill on the following medications per chart history:  [facility] Medications: Medication Name/mg Last Fill Days Supply Simvastatin 20mg .   01/12/2021 90DS   What concerns do you have about your medications?  The patient {denies/reports:25180} side effects with Blanchie Dessert Tribby "Bill"'s medications.   How often do you forget or accidentally miss a dose?   Do you use a pillbox?   Are you having any problems getting your medications from your pharmacy?   Since last visit with CPP, the following interventions have been made:   The patient has not had  an ED visit since last contact.   The patient {denies/reports:25180} problems with their health.   Blanchie Dessert [place] "Rush [place]mark" {denies/reports:25180}  concerns or questions for Sherre Poot, at this time.   Patient states BP and BG readings are as follows     Marcine Matar, [place] Clinical Pharmacist Assistant Time ***

## 2021-04-24 ENCOUNTER — Other Ambulatory Visit: Payer: Self-pay | Admitting: Legal Medicine

## 2021-06-25 ENCOUNTER — Other Ambulatory Visit: Payer: Self-pay | Admitting: Physician Assistant

## 2021-06-25 MED ORDER — "SYRINGE 25G X 1"" 3 ML MISC": 0 refills | Status: DC

## 2021-07-01 ENCOUNTER — Other Ambulatory Visit: Payer: Self-pay

## 2021-07-01 ENCOUNTER — Encounter: Payer: Self-pay | Admitting: Physician Assistant

## 2021-07-01 ENCOUNTER — Ambulatory Visit (INDEPENDENT_AMBULATORY_CARE_PROVIDER_SITE_OTHER): Payer: Medicare Other | Admitting: Physician Assistant

## 2021-07-01 VITALS — BP 122/72 | HR 70 | Temp 97.6°F | Ht 66.0 in | Wt 113.0 lb

## 2021-07-01 DIAGNOSIS — R42 Dizziness and giddiness: Secondary | ICD-10-CM | POA: Insufficient documentation

## 2021-07-01 DIAGNOSIS — M542 Cervicalgia: Secondary | ICD-10-CM | POA: Insufficient documentation

## 2021-07-01 HISTORY — DX: Cervicalgia: M54.2

## 2021-07-01 MED ORDER — PREDNISONE 20 MG PO TABS: 20.0000 mg | ORAL_TABLET | Freq: Two times a day (BID) | ORAL | 0 refills | Status: DC

## 2021-07-01 NOTE — Progress Notes (Signed)
Acute Office Visit  Subjective:    Patient ID: Lucas Kline, male    DOB: [birth year], 87 y.o.   MRN: [id number]  Chief Complaint  Patient presents with   Neck pain    Left side    HPI: Patient is in today for complaints of left side neck pain - states he felt a pop this morning and since that time has had soreness in his neck - hurts worse with turning neck to left Denies dysphagia  Pt mentions that he 'feels off balance'- this has been going on for well over a month - states mostly occurs when walking Denies chest pain/dyspnea Denies uri symptoms  Past Medical History:  Diagnosis Date   Abnormal EKG 01/28/2020   Adult BMI <19 kg/sq m 01/21/2020   Alzheimer disease ([place])    Atherosclerotic heart disease of native coronary artery without angina pectoris    CHRONIC    B12 deficiency    CHRONIC   Bilateral carotid bruits 01/28/2020   Chronic atrial fibrillation ([place]) 01/21/2020   CKD (chronic kidney disease), stage II    MILD   CKD (chronic kidney disease), stage III ([place])    MODERATE (CHRONIC)   Coronary atherosclerosis    CHRONIC   Essential hypertension 01/28/2020   Essential hypertension, benign    Essential tremor    CHRONIC   Malnutrition of moderate degree ([place]) 01/21/2020   Memory deficit 09/08/2020   Mixed hearing loss, bilateral    CHRONIC   Mixed hyperlipidemia    CHRONIC    Obstructive chronic bronchitis without exacerbation ([place]) 01/21/2020   Osteoarthritis    OF KNEE CHRONIC   Personal history of malignant neoplasm of larynx 01/18/2020   Primary hypothyroidism 01/22/2020   Routine general medical examination at a health care facility 09/08/2020   Unspecified arthropathy, lower leg    Unspecified constipation    CHRONIC   Unstable gait 01/21/2020    Past Surgical History:  Procedure Laterality Date   CATARACT EXTRACTION, BILATERAL      Family History  Problem Relation Age of Onset   Hypertension Neg Hx    Heart disease Neg Hx    Cancer Neg  Hx    Diabetes Neg Hx     Social History   Socioeconomic History   Marital status: Widowed    Spouse name: Not on file   Number of children: 2   Years of education: Not on file   Highest education level: Not on file  Occupational History   Occupation: retired   Occupation: Copywriter, advertising  Tobacco Use   Smoking status: Former    Types: Cigarettes    Quit date: 2008    Years since quitting: 14.8   Smokeless tobacco: Current    Types: Chew   Tobacco comments:    1 pouch sometimes  Vaping Use   Vaping Use: Never used  Substance and Sexual Activity   Alcohol use: No   Drug use: No   Sexual activity: Yes  Other Topics Concern   Not on file  Social History Narrative   Not on file   Social Determinants of Health   Financial Resource Strain: Low Risk    Difficulty of Paying Living Expenses: Not hard at all  Food Insecurity: No Food Insecurity   Worried About Charity fundraiser in the Last Year: Never true   [place] in the Last Year: Never true  Transportation Needs: No Transportation Needs   Lack of Transportation (  Medical): No   Lack of Transportation (Non-Medical): No  Physical Activity: Insufficiently Active   Days of Exercise per Week: 2 days   Minutes of Exercise per Session: 60 min  Stress: No Stress Concern Present   Feeling of Stress : Not at all  Social Connections: Moderately Isolated   Frequency of Communication with Friends and Family: Once a week   Frequency of Social Gatherings with Friends and Family: More than three times a week   Attends Religious Services: Never   Marine scientist or Organizations: No   Attends Music therapist: More than 4 times per year   Marital Status: Widowed  Human resources officer Violence: Not At Risk   Fear of Current or Ex-Partner: No   Emotionally Abused: No   Physically Abused: No   Sexually Abused: No    Outpatient Medications Prior to Visit  Medication Sig Dispense Refill   cyanocobalamin  (,VITAMIN B-12,) 1000 MCG/ML injection Inject 1 mL (1,000 mcg total) into the muscle every 30 (thirty) days. 3 mL 6   Glycerin, Laxative, (FLEET LIQUID GLYCERIN SUPP RE) Place rectally daily as needed.     levothyroxine (SYNTHROID) 50 MCG tablet Take 1 tablet (50 mcg total) by mouth daily. 90 tablet 3   Magnesium Hydroxide (DULCOLAX PO) Take 1 tablet by mouth daily as needed.     memantine (NAMENDA) 5 MG tablet Take 5 mg by mouth 2 (two) times daily.     simvastatin (ZOCOR) 20 MG tablet Take 20 mg by mouth daily.     Syringe/Needle, Disp, (SYRINGE 3CC/25GX1") 25G X 1" 3 ML MISC To administer B12 monthly 25 each 0   verapamil (CALAN-SR) 240 MG CR tablet TAKE 1 TABLET BY MOUTH EVERY DAY 90 tablet 2   No facility-administered medications prior to visit.    Allergies  Allergen Reactions   Aricept [Donepezil] Other (See Comments)    Abdominal pain, diarrhea    Review of Systems CONSTITUTIONAL: Negative for chills, fatigue, fever, unintentional weight gain and unintentional weight loss.  E/N/T: Negative for ear pain, nasal congestion and sore throat.  CARDIOVASCULAR: Negative for chest pain, dizziness, palpitations and pedal edema.  RESPIRATORY: Negative for recent cough and dyspnea.   MSK:see HPI INTEGUMENTARY: Negative for rash.  NEUROLOGICAL: see HPI      Objective:    Physical Exam PHYSICAL EXAM:   VS: BP 122/72 (BP Location: Left Arm, Patient Position: Sitting)   Pulse 70   Temp 97.6 F (36.4 C) (Temporal)   Ht '5\' 6"'  (1.676 m)   Wt 113 lb (51.3 kg)   SpO2 95%   BMI 18.24 kg/m   GEN: Well nourished, well developed, in no acute distress  HEENT: normal external ears and nose - normal external auditory canals and TMS -  - Lips, Teeth and Gums - normal  Oropharynx - normal mucosa, palate, and posterior pharynx Neck: no JVD or masses - no thyromegaly Cardiac: RRR; no murmurs, rubs, or gallops,no edema -  Respiratory:  normal respiratory rate and pattern with no distress -  normal breath sounds with no rales, rhonchi, wheezes or rubs MS: no deformity or atrophy - tenderness to left side of neck Skin: warm and dry, no rash    BP 122/72 (BP Location: Left Arm, Patient Position: Sitting)   Pulse 70   Temp 97.6 F (36.4 C) (Temporal)   Ht '5\' 6"'  (1.676 m)   Wt 113 lb (51.3 kg)   SpO2 95%   BMI 18.24 kg/m  Wt Readings from Last 3 Encounters:  07/01/21 113 lb (51.3 kg)  02/23/21 113 lb (51.3 kg)  10/23/20 112 lb 6.4 oz (51 kg)    Health Maintenance Due  Topic Date Due   Zoster Vaccines- Shingrix (1 of 2) Never done   Pneumonia Vaccine 41+ Years old (2 - PPSV23 or PCV20) 01/18/2015   INFLUENZA VACCINE  04/13/2021    There are no preventive care reminders to display for this patient.   Lab Results  Component Value Date   TSH 2.590 02/23/2021   Lab Results  Component Value Date   WBC 5.3 02/23/2021   HGB 12.3 (L) 02/23/2021   HCT 36.8 (L) 02/23/2021   MCV 94 02/23/2021   PLT 168 02/23/2021   Lab Results  Component Value Date   NA 139 02/23/2021   K 4.3 02/23/2021   CO2 20 02/23/2021   GLUCOSE 86 02/23/2021   BUN 15 02/23/2021   CREATININE 1.16 02/23/2021   BILITOT 0.3 02/23/2021   ALKPHOS 57 02/23/2021   AST 11 02/23/2021   ALT 6 02/23/2021   PROT 6.2 02/23/2021   ALBUMIN 3.8 02/23/2021   CALCIUM 8.8 02/23/2021   ANIONGAP 9 02/12/2019   EGFR 60 02/23/2021   Lab Results  Component Value Date   CHOL 144 02/23/2021   Lab Results  Component Value Date   HDL 55 02/23/2021   Lab Results  Component Value Date   LDLCALC 75 02/23/2021   Lab Results  Component Value Date   TRIG 69 02/23/2021   Lab Results  Component Value Date   CHOLHDL 2.6 02/23/2021   No results found for: HGBA1C     Assessment & Plan:   Problem List Items Addressed This Visit       Other   Neck pain - Primary   Relevant Medications   predniSONE (DELTASONE) 20 MG tablet Recommend warm compresses   Dizziness   Relevant Orders   CBC with  Differential/Platelet   Comprehensive metabolic panel   Meds ordered this encounter  Medications   predniSONE (DELTASONE) 20 MG tablet    Sig: Take 1 tablet (20 mg total) by mouth 2 (two) times daily with a meal.    Dispense:  10 tablet    Refill:  0    Order Specific Question:   Supervising Provider    AnswerRochel Brome [phone]     Orders Placed This Encounter  Procedures   CBC with Differential/Platelet   Comprehensive metabolic panel     Follow-up: Return in about 2 weeks (around 07/15/2021) for with Dr Henrene Pastor for follow up.  An After Visit Summary was printed and given to the patient.  Yetta Flock [facility] [phone]

## 2021-07-02 LAB — CBC WITH DIFFERENTIAL/PLATELET
Basophils Absolute: 0.1 10*3/uL (ref 0.0–0.2)
Basos: 1 %
EOS (ABSOLUTE): 0.1 10*3/uL (ref 0.0–0.4)
Eos: 2 %
Hematocrit: 35.8 % — ABNORMAL LOW (ref 37.5–51.0)
Hemoglobin: 12.9 g/dL — ABNORMAL LOW (ref 13.0–17.7)
Immature Grans (Abs): 0 10*3/uL (ref 0.0–0.1)
Immature Granulocytes: 0 %
Lymphocytes Absolute: 1 10*3/uL (ref 0.7–3.1)
Lymphs: 17 %
MCH: 34.3 pg — ABNORMAL HIGH (ref 26.6–33.0)
MCHC: 36 g/dL — ABNORMAL HIGH (ref 31.5–35.7)
MCV: 95 fL (ref 79–97)
Monocytes Absolute: 0.4 10*3/uL (ref 0.1–0.9)
Monocytes: 7 %
Neutrophils Absolute: 4.2 10*3/uL (ref 1.4–7.0)
Neutrophils: 73 %
Platelets: 189 10*3/uL (ref 150–450)
RBC: 3.76 x10E6/uL — ABNORMAL LOW (ref 4.14–5.80)
RDW: 12.5 % (ref 11.6–15.4)
WBC: 5.7 10*3/uL (ref 3.4–10.8)

## 2021-07-02 LAB — COMPREHENSIVE METABOLIC PANEL
ALT: 9 IU/L (ref 0–44)
AST: 13 IU/L (ref 0–40)
Albumin/Globulin Ratio: 2 (ref 1.2–2.2)
Albumin: 4.2 g/dL (ref 3.5–4.6)
Alkaline Phosphatase: 55 IU/L (ref 44–121)
BUN/Creatinine Ratio: 16 (ref 10–24)
BUN: 19 mg/dL (ref 10–36)
Bilirubin Total: 0.4 mg/dL (ref 0.0–1.2)
CO2: 23 mmol/L (ref 20–29)
Calcium: 9.1 mg/dL (ref 8.6–10.2)
Chloride: 104 mmol/L (ref 96–106)
Creatinine, Ser: 1.19 mg/dL (ref 0.76–1.27)
Globulin, Total: 2.1 g/dL (ref 1.5–4.5)
Glucose: 121 mg/dL — ABNORMAL HIGH (ref 70–99)
Potassium: 4.5 mmol/L (ref 3.5–5.2)
Sodium: 141 mmol/L (ref 134–144)
Total Protein: 6.3 g/dL (ref 6.0–8.5)
eGFR: 58 mL/min/{1.73_m2} — ABNORMAL LOW (ref >59)

## 2021-07-07 ENCOUNTER — Telehealth: Payer: Self-pay

## 2021-07-07 NOTE — Chronic Care Management (AMB) (Signed)
    Chronic Care Management Pharmacy Assistant   Name: Lucas Kline  MRN: [id number] DOB: 07/15/1929   Reason for Encounter: General Adherence Call     Recent office visits:  07/01/21 Lucas Duncans PA-C Seen for neck pain. Started on Prednisone 20 mg 2 times daily.  Recent consult visits:  None   Hospital visits:  None  Medications: Outpatient Encounter Medications as of 07/07/2021  Medication Sig   cyanocobalamin (,VITAMIN B-12,) 1000 MCG/ML injection Inject 1 mL (1,000 mcg total) into the muscle every 30 (thirty) days.   Glycerin, Laxative, (FLEET LIQUID GLYCERIN SUPP RE) Place rectally daily as needed.   levothyroxine (SYNTHROID) 50 MCG tablet Take 1 tablet (50 mcg total) by mouth daily.   Magnesium Hydroxide (DULCOLAX PO) Take 1 tablet by mouth daily as needed.   memantine (NAMENDA) 5 MG tablet Take 5 mg by mouth 2 (two) times daily.   predniSONE (DELTASONE) 20 MG tablet Take 1 tablet (20 mg total) by mouth 2 (two) times daily with a meal.   simvastatin (ZOCOR) 20 MG tablet Take 20 mg by mouth daily.   Syringe/Needle, Disp, (SYRINGE 3CC/25GX1") 25G X 1" 3 ML MISC To administer B12 monthly   verapamil (CALAN-SR) 240 MG CR tablet TAKE 1 TABLET BY MOUTH EVERY DAY   No facility-administered encounter medications on file as of 07/07/2021.   Contacted Lucas Kline for general disease state and medication adherence call.   Patient is not > 5 days past due for refill on the following medications per chart history:  [facility] Medications: Medication Name/mg Last Fill Days Supply Simvastatin 20 mg  04/09/21 90ds   What concerns do you have about your medications? Pt is not having any issues   The patient denies side effects with his medications.   How often do you forget or accidentally miss a dose? Never  Do you use a pillbox? Yes  Are you having any problems getting your medications from your pharmacy? No  Has the cost of your medications been a concern?  No  Since last visit with CPP, no interventions have been made:   The patient has not had an ED visit since last contact.   The patient reports the following problems with their health. Pt stated he deals with staggering while walking. He reported no falls. He stated overall he is doing well. He is staying active.     Care Gaps: Last annual wellness visit? 00/76/22 If applicable:N/A Last eye exam / retinopathy screening? Diabetic foot exam?   Lucas Kline, [place] Pharmacist Assistant  [phone]

## 2021-07-15 ENCOUNTER — Encounter: Payer: Self-pay | Admitting: Legal Medicine

## 2021-07-15 ENCOUNTER — Other Ambulatory Visit: Payer: Self-pay

## 2021-07-15 ENCOUNTER — Ambulatory Visit (INDEPENDENT_AMBULATORY_CARE_PROVIDER_SITE_OTHER): Payer: Medicare Other | Admitting: Legal Medicine

## 2021-07-15 VITALS — BP 104/64 | HR 76 | Temp 97.9°F | Wt 112.0 lb

## 2021-07-15 DIAGNOSIS — R42 Dizziness and giddiness: Secondary | ICD-10-CM | POA: Diagnosis not present

## 2021-07-15 NOTE — Progress Notes (Signed)
Established Patient Office Visit  Subjective:  Patient ID: Lucas Kline, male    DOB: [birth year]  Age: 87 y.o. MRN: [id number]  CC:  Chief Complaint  Patient presents with   Follow-up    2WK   Dizziness    HPI Lucas Kline [facility] presents for popping in neck.no further dizziness.  Memory stable.  He ataxia chronic.  Past Medical History:  Diagnosis Date   Abnormal EKG 01/28/2020   Adult BMI <19 kg/sq m 01/21/2020   Alzheimer disease ([place])    Atherosclerotic heart disease of native coronary artery without angina pectoris    CHRONIC    B12 deficiency    CHRONIC   Bilateral carotid bruits 01/28/2020   Chronic atrial fibrillation ([place]) 01/21/2020   CKD (chronic kidney disease), stage II    MILD   CKD (chronic kidney disease), stage III ([facility])    MODERATE (CHRONIC)   Coronary atherosclerosis    CHRONIC   Essential hypertension 01/28/2020   Essential hypertension, benign    Essential tremor    CHRONIC   Malnutrition of moderate degree ([place]) 01/21/2020   Memory deficit 09/08/2020   Mixed hearing loss, bilateral    CHRONIC   Mixed hyperlipidemia    CHRONIC    Obstructive chronic bronchitis without exacerbation ([place]) 01/21/2020   Osteoarthritis    OF KNEE CHRONIC   Personal history of malignant neoplasm of larynx 01/18/2020   Primary hypothyroidism 01/22/2020   Routine general medical examination at a health care facility 09/08/2020   Unspecified arthropathy, lower leg    Unspecified constipation    CHRONIC   Unstable gait 01/21/2020    Past Surgical History:  Procedure Laterality Date   CATARACT EXTRACTION, BILATERAL      Family History  Problem Relation Age of Onset   Hypertension Neg Hx    Heart disease Neg Hx    Cancer Neg Hx    Diabetes Neg Hx     Social History   Socioeconomic History   Marital status: Widowed    Spouse name: Not on file   Number of children: 2   Years of education: Not on file   Highest education level: Not on file   Occupational History   Occupation: retired   Occupation: Copywriter, advertising  Tobacco Use   Smoking status: Former    Types: Cigarettes    Quit date: 2008    Years since quitting: 14.8   Smokeless tobacco: Current    Types: Chew   Tobacco comments:    1 pouch sometimes  Vaping Use   Vaping Use: Never used  Substance and Sexual Activity   Alcohol use: No   Drug use: No   Sexual activity: Yes  Other Topics Concern   Not on file  Social History Narrative   Not on file   Social Determinants of Health   Financial Resource Strain: Low Risk    Difficulty of Paying Living Expenses: Not hard at all  Food Insecurity: No Food Insecurity   Worried About Charity fundraiser in the Last Year: Never true   [place] in the Last Year: Never true  Transportation Needs: No Transportation Needs   Lack of Transportation (Medical): No   Lack of Transportation (Non-Medical): No  Physical Activity: Insufficiently Active   Days of Exercise per Week: 2 days   Minutes of Exercise per Session: 60 min  Stress: No Stress Concern Present   Feeling of Stress : Not at all  Social Connections:  Moderately Isolated   Frequency of Communication with Friends and Family: Once a week   Frequency of Social Gatherings with Friends and Family: More than three times a week   Attends Religious Services: Never   Marine scientist or Organizations: No   Attends Music therapist: More than 4 times per year   Marital Status: Widowed  Human resources officer Violence: Not At Risk   Fear of Current or Ex-Partner: No   Emotionally Abused: No   Physically Abused: No   Sexually Abused: No    Outpatient Medications Prior to Visit  Medication Sig Dispense Refill   cyanocobalamin (,VITAMIN B-12,) 1000 MCG/ML injection Inject 1 mL (1,000 mcg total) into the muscle every 30 (thirty) days. 3 mL 6   Glycerin, Laxative, (FLEET LIQUID GLYCERIN SUPP RE) Place rectally daily as needed.     levothyroxine  (SYNTHROID) 50 MCG tablet Take 1 tablet (50 mcg total) by mouth daily. 90 tablet 3   Magnesium Hydroxide (DULCOLAX PO) Take 1 tablet by mouth daily as needed.     memantine (NAMENDA) 5 MG tablet Take 5 mg by mouth 2 (two) times daily.     predniSONE (DELTASONE) 20 MG tablet Take 1 tablet (20 mg total) by mouth 2 (two) times daily with a meal. 10 tablet 0   simvastatin (ZOCOR) 20 MG tablet Take 20 mg by mouth daily.     Syringe/Needle, Disp, (SYRINGE 3CC/25GX1") 25G X 1" 3 ML MISC To administer B12 monthly 25 each 0   verapamil (CALAN-SR) 240 MG CR tablet TAKE 1 TABLET BY MOUTH EVERY DAY 90 tablet 2   No facility-administered medications prior to visit.    Allergies  Allergen Reactions   Aricept [Donepezil] Other (See Comments)    Abdominal pain, diarrhea    ROS Review of Systems  Constitutional:  Negative for activity change and appetite change.  HENT:  Negative for congestion and dental problem.   Eyes:  Negative for visual disturbance.  Respiratory:  Negative for chest tightness and shortness of breath.   Cardiovascular:  Negative for chest pain, palpitations and leg swelling.  Gastrointestinal:  Negative for abdominal distention and abdominal pain.  Genitourinary:  Negative for difficulty urinating.  Musculoskeletal:  Negative for arthralgias and back pain.  Skin: Negative.   Neurological:  Positive for tremors.  Psychiatric/Behavioral: Negative.       Objective:    Physical Exam Vitals reviewed.  Constitutional:      General: He is not in acute distress.    Appearance: Normal appearance.  HENT:     Head: Normocephalic.     Right Ear: Tympanic membrane, ear canal and external ear normal.     Left Ear: Tympanic membrane, ear canal and external ear normal.     Mouth/Throat:     Mouth: Mucous membranes are moist.     Pharynx: Oropharynx is clear.  Eyes:     Extraocular Movements: Extraocular movements intact.     Conjunctiva/sclera: Conjunctivae normal.     Pupils:  Pupils are equal, round, and reactive to light.  Cardiovascular:     Rate and Rhythm: Normal rate and regular rhythm.     Pulses: Normal pulses.     Heart sounds: Normal heart sounds. No murmur heard.   No gallop.  Pulmonary:     Effort: Pulmonary effort is normal. No respiratory distress.     Breath sounds: Normal breath sounds. No wheezing.  Abdominal:     General: Abdomen is flat. Bowel sounds are  normal. There is no distension.     Palpations: Abdomen is soft.     Tenderness: There is no abdominal tenderness.  Musculoskeletal:        General: Normal range of motion.     Cervical back: Normal range of motion.  Skin:    General: Skin is warm.     Capillary Refill: Capillary refill takes less than 2 seconds.  Neurological:     General: No focal deficit present.     Mental Status: He is alert and oriented to person, place, and time. Mental status is at baseline.     Comments: Negative [place]  Psychiatric:        Mood and Affect: Mood normal.    BP 104/64 (BP Location: Right Arm, Patient Position: Sitting, Cuff Size: Normal)   Pulse 76   Temp 97.9 F (36.6 C) (Temporal)   Wt 112 lb (50.8 kg)   SpO2 96%   BMI 18.08 kg/m  Wt Readings from Last 3 Encounters:  07/15/21 112 lb (50.8 kg)  07/01/21 113 lb (51.3 kg)  02/23/21 113 lb (51.3 kg)     Health Maintenance Due  Topic Date Due   Zoster Vaccines- Shingrix (1 of 2) Never done   Pneumonia Vaccine 29+ Years old (2 - PCV) 01/18/2015   INFLUENZA VACCINE  04/13/2021    There are no preventive care reminders to display for this patient.  Lab Results  Component Value Date   TSH 2.590 02/23/2021   Lab Results  Component Value Date   WBC 5.7 07/01/2021   HGB 12.9 (L) 07/01/2021   HCT 35.8 (L) 07/01/2021   MCV 95 07/01/2021   PLT 189 07/01/2021   Lab Results  Component Value Date   NA 141 07/01/2021   K 4.5 07/01/2021   CO2 23 07/01/2021   GLUCOSE 121 (H) 07/01/2021   BUN 19 07/01/2021   CREATININE 1.19  07/01/2021   BILITOT 0.4 07/01/2021   ALKPHOS 55 07/01/2021   AST 13 07/01/2021   ALT 9 07/01/2021   PROT 6.3 07/01/2021   ALBUMIN 4.2 07/01/2021   CALCIUM 9.1 07/01/2021   ANIONGAP 9 02/12/2019   EGFR 58 (L) 07/01/2021   Lab Results  Component Value Date   CHOL 144 02/23/2021   Lab Results  Component Value Date   HDL 55 02/23/2021   Lab Results  Component Value Date   LDLCALC 75 02/23/2021   Lab Results  Component Value Date   TRIG 69 02/23/2021   Lab Results  Component Value Date   CHOLHDL 2.6 02/23/2021   No results found for: HGBA1C    Assessment & Plan:   Problem List Items Addressed This Visit       Other   Dizziness - Primary Vertigo has resolved.  He still has some chronic ataxia but stable       Follow-up: Return in about 3 months (around 10/15/2021).    Reinaldo Meeker, MD

## 2021-08-25 ENCOUNTER — Ambulatory Visit: Payer: Medicare Other | Admitting: Legal Medicine

## 2021-08-29 NOTE — Progress Notes (Signed)
Subjective:  Patient ID: Lucas Kline, male    DOB: [birth year]  Age: 87 y.o. MRN: [id number]  Chief Complaint  Patient presents with   Hypothyroidism    HPI: chroncic visit  Patient presents for follow up of hypertension.  Patient tolerating verapamil well with side effects.  Patient was diagnosed with hypertension 2010 so has been treated for hypertension for 12 years.Patient is working on maintaining diet and exercise regimen and follows up as directed. Complication include cad.   An individual plan was formulated based on patient history and exam, labs and evidence based data. Patient has not had recent angina or nitroglycerin use. continue present treatment.   CORONARY ARTERY DISEASE  Patient presents in follow up of CAD. Patient was diagnosed in 2019. The patient has no associated CHF. The patient is currently taking a beta blocker, statin, and aspirin. CAD was diagnosed 2019 years ago.  Patient is having no angina. Patient has used no NTG.  Patient is followed by cardiology.  Patient had no stent . Last angiography was na, last echocardiogram 2022.   Patient presents with hyperlipidemia.  Compliance with treatment has been good; patient takes medicines as directed, maintains low cholesterol diet, follows up as directed, and maintains exercise regimen.  Patient is using simvastatin without problems.  MMSE today 18/30   Current Outpatient Medications on File Prior to Visit  Medication Sig Dispense Refill   cyanocobalamin (,VITAMIN B-12,) 1000 MCG/ML injection Inject 1 mL (1,000 mcg total) into the muscle every 30 (thirty) days. 3 mL 6   Glycerin, Laxative, (FLEET LIQUID GLYCERIN SUPP RE) Place rectally daily as needed.     levothyroxine (SYNTHROID) 50 MCG tablet Take 1 tablet (50 mcg total) by mouth daily. 90 tablet 3   Magnesium Hydroxide (DULCOLAX PO) Take 1 tablet by mouth daily as needed.     memantine (NAMENDA) 5 MG tablet Take 5 mg by mouth 2 (two) times daily.      simvastatin (ZOCOR) 20 MG tablet Take 20 mg by mouth daily.     Syringe/Needle, Disp, (SYRINGE 3CC/25GX1") 25G X 1" 3 ML MISC To administer B12 monthly 25 each 0   verapamil (CALAN-SR) 240 MG CR tablet TAKE 1 TABLET BY MOUTH EVERY DAY 90 tablet 2   No current facility-administered medications on file prior to visit.   Past Medical History:  Diagnosis Date   Abnormal EKG 01/28/2020   Adult BMI <19 kg/sq m 01/21/2020   Alzheimer disease ([place])    Atherosclerotic heart disease of native coronary artery without angina pectoris    CHRONIC    B12 deficiency    CHRONIC   Bilateral carotid bruits 01/28/2020   Chronic atrial fibrillation ([place]) 01/21/2020   CKD (chronic kidney disease), stage II    MILD   CKD (chronic kidney disease), stage III (HCC)    MODERATE (CHRONIC)   Coronary atherosclerosis    CHRONIC   Essential hypertension 01/28/2020   Essential hypertension, benign    Essential tremor    CHRONIC   Malnutrition of moderate degree ([place]) 01/21/2020   Memory deficit 09/08/2020   Mixed hearing loss, bilateral    CHRONIC   Mixed hyperlipidemia    CHRONIC    Obstructive chronic bronchitis without exacerbation ([place]) 01/21/2020   Osteoarthritis    OF KNEE CHRONIC   Personal history of malignant neoplasm of larynx 01/18/2020   Primary hypothyroidism 01/22/2020   Routine general medical examination at a health care facility 09/08/2020   Unspecified arthropathy, lower leg  Unspecified constipation    CHRONIC   Unstable gait 01/21/2020   Past Surgical History:  Procedure Laterality Date   CATARACT EXTRACTION, BILATERAL      Family History  Problem Relation Age of Onset   Hypertension Neg Hx    Heart disease Neg Hx    Cancer Neg Hx    Diabetes Neg Hx    Social History   Socioeconomic History   Marital status: Widowed    Spouse name: Not on file   Number of children: 2   Years of education: Not on file   Highest education level: Not on file  Occupational History   Occupation:  retired   Occupation: Copywriter, advertising  Tobacco Use   Smoking status: Former    Types: Cigarettes    Quit date: 2008    Years since quitting: 14.9   Smokeless tobacco: Current    Types: Chew   Tobacco comments:    1 pouch sometimes  Vaping Use   Vaping Use: Never used  Substance and Sexual Activity   Alcohol use: No   Drug use: No   Sexual activity: Yes  Other Topics Concern   Not on file  Social History Narrative   Not on file   Social Determinants of Health   Financial Resource Strain: Low Risk    Difficulty of Paying Living Expenses: Not hard at all  Food Insecurity: No Food Insecurity   Worried About Charity fundraiser in the Last Year: Never true   [place] in the Last Year: Never true  Transportation Needs: No Transportation Needs   Lack of Transportation (Medical): No   Lack of Transportation (Non-Medical): No  Physical Activity: Insufficiently Active   Days of Exercise per Week: 2 days   Minutes of Exercise per Session: 60 min  Stress: No Stress Concern Present   Feeling of Stress : Not at all  Social Connections: Moderately Isolated   Frequency of Communication with Friends and Family: Once a week   Frequency of Social Gatherings with Friends and Family: More than three times a week   Attends Religious Services: Never   Marine scientist or Organizations: No   Attends Music therapist: More than 4 times per year   Marital Status: Widowed    Review of Systems  Constitutional:  Negative for chills, fatigue, fever and unexpected weight change.  HENT:  Negative for congestion, ear pain, sinus pain and sore throat.   Respiratory:  Negative for cough and chest tightness.   Cardiovascular:  Negative for chest pain and palpitations.  Gastrointestinal:  Negative for abdominal pain, blood in stool, constipation, diarrhea, nausea and vomiting.  Endocrine: Negative for polydipsia.  Genitourinary:  Negative for dysuria.  Musculoskeletal:   Negative for back pain.  Skin:  Negative for rash.  Neurological:  Negative for headaches.  Psychiatric/Behavioral: Negative.      Objective:  BP 102/66 (BP Location: Left Arm, Patient Position: Sitting, Cuff Size: Normal)    Pulse 81    Temp (!) 97 F (36.1 C) (Temporal)    Wt 112 lb 9.6 oz (51.1 kg)    SpO2 93%    BMI 18.17 kg/m   BP/Weight 08/31/2021 07/15/2021 95/63/8756  Systolic BP 433 295 188  Diastolic BP 66 64 72  Wt. (Lbs) 112.6 112 113  BMI 18.17 18.08 18.24    Physical Exam Vitals reviewed.  Constitutional:      General: He is not in acute distress.  Appearance: Normal appearance.  HENT:     Right Ear: Tympanic membrane normal.     Left Ear: Tympanic membrane normal.     Nose: Nose normal.     Mouth/Throat:     Mouth: Mucous membranes are moist.  Eyes:     Conjunctiva/sclera: Conjunctivae normal.     Pupils: Pupils are equal, round, and reactive to light.  Cardiovascular:     Rate and Rhythm: Normal rate and regular rhythm.     Pulses: Normal pulses.     Heart sounds: Normal heart sounds. No murmur heard.   No gallop.  Pulmonary:     Effort: Pulmonary effort is normal. No respiratory distress.     Breath sounds: Normal breath sounds. No wheezing.  Abdominal:     General: Abdomen is flat. Bowel sounds are normal. There is no distension.     Tenderness: There is no abdominal tenderness.  Musculoskeletal:        General: Normal range of motion.     Cervical back: Normal range of motion and neck supple.     Right lower leg: No edema.     Left lower leg: No edema.  Skin:    General: Skin is warm.     Capillary Refill: Capillary refill takes less than 2 seconds.  Neurological:     General: No focal deficit present.     Mental Status: He is alert and oriented to person, place, and time. Mental status is at baseline.     Gait: Gait normal.     Deep Tendon Reflexes: Reflexes normal.  Psychiatric:        Mood and Affect: Mood normal.        Behavior:  Behavior normal.        Thought Content: Thought content normal.        Lab Results  Component Value Date   WBC 5.7 07/01/2021   HGB 12.9 (L) 07/01/2021   HCT 35.8 (L) 07/01/2021   PLT 189 07/01/2021   GLUCOSE 121 (H) 07/01/2021   CHOL 144 02/23/2021   TRIG 69 02/23/2021   HDL 55 02/23/2021   LDLCALC 75 02/23/2021   ALT 9 07/01/2021   AST 13 07/01/2021   NA 141 07/01/2021   K 4.5 07/01/2021   CL 104 07/01/2021   CREATININE 1.19 07/01/2021   BUN 19 07/01/2021   CO2 23 07/01/2021   TSH 2.590 02/23/2021      Assessment & Plan:   Problem List Items Addressed This Visit       Cardiovascular and Mediastinum   Atherosclerotic heart disease of native coronary artery without angina pectoris An individual plan was formulated based on patient history and exam, labs and evidence based data. Patient has not had recent angina or nitroglycerin use. continue present treatment.     Chronic atrial fibrillation (HCC) - Primary Patient has a diagnosis of chronic atrial fibrillation.   Patient is on none and has controlled ventricular response.  Patient is CV stable.     Essential hypertension, benign   Relevant Orders   Comprehensive metabolic panel   CBC with Differential/Platelet An individual hypertension care plan was established and reinforced today.  The patient's status was assessed using clinical findings on exam and labs or diagnostic tests. The patient's success at meeting treatment goals on disease specific evidence-based guidelines and found to be well controlled. SELF MANAGEMENT: The patient and I together assessed ways to personally work towards obtaining the recommended goals. RECOMMENDATIONS: avoid decongestants found in  common cold remedies, decrease consumption of alcohol, perform routine monitoring of BP with home BP cuff, exercise, reduction of dietary salt, take medicines as prescribed, try not to miss doses and quit smoking.  Regular exercise and maintaining a  healthy weight is needed.  Stress reduction may help. A CLINICAL SUMMARY including written plan identify barriers to care unique to individual due to social or financial issues.  We attempt to mutually creat solutions for individual and family understanding.      Respiratory   Obstructive chronic bronchitis without exacerbation ([place]) An individualize plan was formulated for care of COPD.  Treatment is evidence based.  She will continue on inhalers, avoid smoking and smoke.  Regular exercise with help with dyspnea. Routine follow ups and medication compliance is needed.      Endocrine   Primary hypothyroidism   Relevant Orders   TSH Patient is known to have hypothyroidism and is n treatment with levothyroxine 64mcg.  Patient was diagnosed 10 years ago.  Other treatment includes none.  Patient is compliant with medicines and last TSH 6 months ago.  Last TSH was normal.      Nervous and Auditory   Alzheimer disease ([place]) Patientis having progressive memory loss.  We discussed not driving.  MMSE 18/30    Mixed hearing loss, bilateral Patient has hearing loss and needs hearing aids     Genitourinary   CKD (chronic kidney disease), stage III ([place]) AN INDIVIDUAL CARE PLAN renal disease was established and reinforced today.  The patient's status was assessed using clinical findings on exam, labs, and other diagnostic testing. Patient's success at meeting treatment goals based on disease specific evidence-bassed guidelines and found to be in fair control. RECOMMENDATIONS include maintaining present medicines and treatment.      Other   Mixed hyperlipidemia   Relevant Orders   Lipid panel AN INDIVIDUAL CARE PLAN for hyperlipidemia/ cholesterol was established and reinforced today.  The patient's status was assessed using clinical findings on exam, lab and other diagnostic tests. The patient's disease status was assessed based on evidence-based guidelines and found to be well  controlled. MEDICATIONS were reviewed. SELF MANAGEMENT GOALS have been discussed and patient's success at attaining the goal of low cholesterol was assessed. RECOMMENDATION given include regular exercise 3 days a week and low cholesterol/low fat diet. CLINICAL SUMMARY including written plan to identify barriers unique to the patient due to social or economic  reasons was discussed.   .    Orders Placed This Encounter  Procedures   Comprehensive metabolic panel   Lipid panel   CBC with Differential/Platelet   TSH     Follow-up: Return in about 4 months (around 12/30/2021) for fasting.  An After Visit Summary was printed and given to the patient.  Lucas Meeker, MD [facility] [phone]

## 2021-08-31 ENCOUNTER — Encounter: Payer: Self-pay | Admitting: Legal Medicine

## 2021-08-31 ENCOUNTER — Other Ambulatory Visit: Payer: Self-pay

## 2021-08-31 ENCOUNTER — Ambulatory Visit (INDEPENDENT_AMBULATORY_CARE_PROVIDER_SITE_OTHER): Payer: Medicare Other | Admitting: Legal Medicine

## 2021-08-31 VITALS — BP 102/66 | HR 81 | Temp 97.0°F | Wt 112.6 lb

## 2021-08-31 DIAGNOSIS — E782 Mixed hyperlipidemia: Secondary | ICD-10-CM

## 2021-08-31 DIAGNOSIS — G309 Alzheimer's disease, unspecified: Secondary | ICD-10-CM | POA: Diagnosis not present

## 2021-08-31 DIAGNOSIS — J449 Chronic obstructive pulmonary disease, unspecified: Secondary | ICD-10-CM | POA: Diagnosis not present

## 2021-08-31 DIAGNOSIS — I2510 Atherosclerotic heart disease of native coronary artery without angina pectoris: Secondary | ICD-10-CM

## 2021-08-31 DIAGNOSIS — N183 Chronic kidney disease, stage 3 unspecified: Secondary | ICD-10-CM

## 2021-08-31 DIAGNOSIS — H906 Mixed conductive and sensorineural hearing loss, bilateral: Secondary | ICD-10-CM

## 2021-08-31 DIAGNOSIS — I482 Chronic atrial fibrillation, unspecified: Secondary | ICD-10-CM

## 2021-08-31 DIAGNOSIS — E039 Hypothyroidism, unspecified: Secondary | ICD-10-CM

## 2021-08-31 DIAGNOSIS — I10 Essential (primary) hypertension: Secondary | ICD-10-CM | POA: Diagnosis not present

## 2021-08-31 DIAGNOSIS — F0280 Dementia in other diseases classified elsewhere without behavioral disturbance: Secondary | ICD-10-CM

## 2021-09-01 LAB — CBC WITH DIFFERENTIAL/PLATELET
Basophils Absolute: 0.1 10*3/uL (ref 0.0–0.2)
Basos: 1 %
EOS (ABSOLUTE): 0.2 10*3/uL (ref 0.0–0.4)
Eos: 4 %
Hematocrit: 38.4 % (ref 37.5–51.0)
Hemoglobin: 13.1 g/dL (ref 13.0–17.7)
Immature Grans (Abs): 0 10*3/uL (ref 0.0–0.1)
Immature Granulocytes: 0 %
Lymphocytes Absolute: 1.2 10*3/uL (ref 0.7–3.1)
Lymphs: 23 %
MCH: 32.3 pg (ref 26.6–33.0)
MCHC: 34.1 g/dL (ref 31.5–35.7)
MCV: 95 fL (ref 79–97)
Monocytes Absolute: 0.6 10*3/uL (ref 0.1–0.9)
Monocytes: 11 %
Neutrophils Absolute: 3.3 10*3/uL (ref 1.4–7.0)
Neutrophils: 61 %
Platelets: 199 10*3/uL (ref 150–450)
RBC: 4.06 x10E6/uL — ABNORMAL LOW (ref 4.14–5.80)
RDW: 11.6 % (ref 11.6–15.4)
WBC: 5.4 10*3/uL (ref 3.4–10.8)

## 2021-09-01 LAB — LIPID PANEL
Chol/HDL Ratio: 2.6 ratio (ref 0.0–5.0)
Cholesterol, Total: 161 mg/dL (ref 100–199)
HDL: 63 mg/dL (ref >39)
LDL Chol Calc (NIH): 83 mg/dL (ref 0–99)
Triglycerides: 80 mg/dL (ref 0–149)
VLDL Cholesterol Cal: 15 mg/dL (ref 5–40)

## 2021-09-01 LAB — COMPREHENSIVE METABOLIC PANEL
ALT: 10 IU/L (ref 0–44)
AST: 17 IU/L (ref 0–40)
Albumin/Globulin Ratio: 1.9 (ref 1.2–2.2)
Albumin: 4.2 g/dL (ref 3.5–4.6)
Alkaline Phosphatase: 62 IU/L (ref 44–121)
BUN/Creatinine Ratio: 11 (ref 10–24)
BUN: 15 mg/dL (ref 10–36)
Bilirubin Total: 0.4 mg/dL (ref 0.0–1.2)
CO2: 22 mmol/L (ref 20–29)
Calcium: 9.1 mg/dL (ref 8.6–10.2)
Chloride: 104 mmol/L (ref 96–106)
Creatinine, Ser: 1.33 mg/dL — ABNORMAL HIGH (ref 0.76–1.27)
Globulin, Total: 2.2 g/dL (ref 1.5–4.5)
Glucose: 96 mg/dL (ref 70–99)
Potassium: 4.7 mmol/L (ref 3.5–5.2)
Sodium: 140 mmol/L (ref 134–144)
Total Protein: 6.4 g/dL (ref 6.0–8.5)
eGFR: 51 mL/min/{1.73_m2} — ABNORMAL LOW (ref >59)

## 2021-09-01 LAB — TSH: TSH: 4.72 u[IU]/mL — ABNORMAL HIGH (ref 0.450–4.500)

## 2021-09-01 LAB — CARDIOVASCULAR RISK ASSESSMENT

## 2021-09-01 NOTE — Progress Notes (Signed)
Kidney tests stage 3 a, liver tests normal, cholesterol normal, cbc normal, TSH 4.72 slightly high- we will watch,  lp

## 2021-09-16 ENCOUNTER — Encounter: Payer: Self-pay | Admitting: Legal Medicine

## 2021-09-16 ENCOUNTER — Telehealth: Payer: Self-pay

## 2021-09-16 NOTE — Telephone Encounter (Signed)
Unable to reach patient today by telephone for his scheduled AWV.

## 2021-09-17 NOTE — Telephone Encounter (Signed)
Cn get early lp

## 2021-09-22 ENCOUNTER — Telehealth: Payer: Self-pay

## 2021-09-22 NOTE — Telephone Encounter (Signed)
Anderson Malta called with concerns abut her Dad.  He has been experiencing pain in his knee and leg. She denies redness or heat.  She called to request and appointment for follow-up.  Appointment scheduled for Monday at her request.  She was instructed to call us back if he develops redness or swelling.

## 2021-09-27 NOTE — Progress Notes (Signed)
Acute Office Visit  Subjective:    Patient ID: Lucas Kline, male    DOB: July 07, 1929, 88 y.o.   MRN: [id number]  Chief Complaint  Patient presents with   Left leg pain    [place] and hit leg    HPI Patient is in today for a fall on left leg. He does not remember when he fell. Pt was by himself when he fell. Pt was carrying wood and fell. Pt has dementia and does not remember when he fell or why. He does not believe he passed out.   Past Medical History:  Diagnosis Date   Abnormal EKG 01/28/2020   Adult BMI <19 kg/sq m 01/21/2020   Alzheimer disease ([place])    Atherosclerotic heart disease of native coronary artery without angina pectoris    CHRONIC    B12 deficiency    CHRONIC   Bilateral carotid bruits 01/28/2020   Chronic atrial fibrillation ([place]) 01/21/2020   CKD (chronic kidney disease), stage II    MILD   CKD (chronic kidney disease), stage III (HCC)    MODERATE (CHRONIC)   Coronary atherosclerosis    CHRONIC   Essential hypertension 01/28/2020   Essential hypertension, benign    Essential tremor    CHRONIC   Malnutrition of moderate degree ([place]) 01/21/2020   Memory deficit 09/08/2020   Mixed hearing loss, bilateral    CHRONIC   Mixed hyperlipidemia    CHRONIC    Obstructive chronic bronchitis without exacerbation ([place]) 01/21/2020   Osteoarthritis    OF KNEE CHRONIC   Personal history of malignant neoplasm of larynx 01/18/2020   Primary hypothyroidism 01/22/2020   Routine general medical examination at a health care facility 09/08/2020   Unspecified arthropathy, lower leg    Unspecified constipation    CHRONIC   Unstable gait 01/21/2020    Past Surgical History:  Procedure Laterality Date   CATARACT EXTRACTION, BILATERAL      Family History  Problem Relation Age of Onset   Hypertension Neg Hx    Heart disease Neg Hx    Cancer Neg Hx    Diabetes Neg Hx     Social History   Socioeconomic History   Marital status: Widowed    Spouse name: Not on file    Number of children: 2   Years of education: Not on file   Highest education level: Not on file  Occupational History   Occupation: retired   Occupation: Copywriter, advertising  Tobacco Use   Smoking status: Former    Types: Cigarettes    Quit date: 2008    Years since quitting: 15.0   Smokeless tobacco: Current    Types: Chew   Tobacco comments:    1 pouch sometimes  Vaping Use   Vaping Use: Never used  Substance and Sexual Activity   Alcohol use: No   Drug use: No   Sexual activity: Yes  Other Topics Concern   Not on file  Social History Narrative   Not on file   Social Determinants of Health   Financial Resource Strain: Not on file  Food Insecurity: Not on file  Transportation Needs: Not on file  Physical Activity: Not on file  Stress: Not on file  Social Connections: Not on file  Intimate Partner Violence: Not on file    Outpatient Medications Prior to Visit  Medication Sig Dispense Refill   cyanocobalamin (,VITAMIN B-12,) 1000 MCG/ML injection Inject 1 mL (1,000 mcg total) into the muscle every 30 (thirty) days.  3 mL 6   Glycerin, Laxative, (FLEET LIQUID GLYCERIN SUPP RE) Place rectally daily as needed.     levothyroxine (SYNTHROID) 50 MCG tablet Take 1 tablet (50 mcg total) by mouth daily. 90 tablet 3   Magnesium Hydroxide (DULCOLAX PO) Take 1 tablet by mouth daily as needed.     memantine (NAMENDA) 5 MG tablet Take 5 mg by mouth 2 (two) times daily.     simvastatin (ZOCOR) 20 MG tablet Take 20 mg by mouth daily.     Syringe/Needle, Disp, (SYRINGE 3CC/25GX1") 25G X 1" 3 ML MISC To administer B12 monthly 25 each 0   verapamil (CALAN-SR) 240 MG CR tablet TAKE 1 TABLET BY MOUTH EVERY DAY 90 tablet 2   No facility-administered medications prior to visit.    Allergies  Allergen Reactions   Aricept [Donepezil] Other (See Comments)    Abdominal pain, diarrhea    Review of Systems  Constitutional:  Negative for appetite change, fatigue and fever.  HENT:  Negative  for congestion, ear pain and sore throat.   Respiratory:  Negative for cough and shortness of breath.   Cardiovascular:  Negative for chest pain and leg swelling.  Gastrointestinal:  Negative for abdominal pain, constipation, diarrhea, nausea and vomiting.  Genitourinary:  Negative for dysuria and frequency.  Musculoskeletal:  Positive for myalgias (Left leg pain). Negative for arthralgias.  Neurological:  Negative for dizziness and headaches.  Psychiatric/Behavioral:  Negative for dysphoric mood. The patient is not nervous/anxious.       Objective:    Physical Exam Vitals reviewed.  Constitutional:      Appearance: Normal appearance. He is normal weight.  Musculoskeletal:     Comments: LEFT KNEE EXAM Tender: mild Negative patellar apprehension Negative ligament laxity.  McMurray's signs -possibly mild mcmurrays on internal rotation.  Negative Anterior drawer movement/Lachmans Negative Posterior drawer movement  Excellent ROM.  Neurological:     Mental Status: He is alert.  Psychiatric:        Mood and Affect: Mood normal.        Behavior: Behavior normal.    BP 118/68 (BP Location: Right Arm, Patient Position: Sitting)    Pulse 76    Temp 98.3 F (36.8 C) (Temporal)    Ht _0  (1.676 m)    Wt 113 lb (51.3 kg)    SpO2 95%    BMI 18.24 kg/m  Wt Readings from Last 3 Encounters:  09/28/21 113 lb (51.3 kg)  08/31/21 112 lb 9.6 oz (51.1 kg)  07/15/21 112 lb (50.8 kg)    Health Maintenance Due  Topic Date Due   Zoster Vaccines- Shingrix (1 of 2) Never done   INFLUENZA VACCINE  04/13/2021    There are no preventive care reminders to display for this patient.   Lab Results  Component Value Date   TSH 4.720 (H) 08/31/2021   Lab Results  Component Value Date   WBC 5.4 08/31/2021   HGB 13.1 08/31/2021   HCT 38.4 08/31/2021   MCV 95 08/31/2021   PLT 199 08/31/2021   Lab Results  Component Value Date   NA 140 08/31/2021   K 4.7 08/31/2021   CO2 22 08/31/2021    GLUCOSE 96 08/31/2021   BUN 15 08/31/2021   CREATININE 1.33 (H) 08/31/2021   BILITOT 0.4 08/31/2021   ALKPHOS 62 08/31/2021   AST 17 08/31/2021   ALT 10 08/31/2021   PROT 6.4 08/31/2021   ALBUMIN 4.2 08/31/2021   CALCIUM 9.1 08/31/2021  ANIONGAP 9 02/12/2019   EGFR 51 (L) 08/31/2021   Lab Results  Component Value Date   CHOL 161 08/31/2021   Lab Results  Component Value Date   HDL 63 08/31/2021   Lab Results  Component Value Date   LDLCALC 83 08/31/2021   Lab Results  Component Value Date   TRIG 80 08/31/2021   Lab Results  Component Value Date   CHOLHDL 2.6 08/31/2021   No results found for: HGBA1C       Assessment & Plan:   There are no diagnoses linked to this encounter.   No orders of the defined types were placed in this encounter.   I,Lauren M Auman,acting as a scribe for Rochel Brome, MD.,have documented all relevant documentation on the behalf of Rochel Brome, MD,as directed by  Rochel Brome, MD while in the presence of Rochel Brome, MD.   Rochel Brome, MD

## 2021-09-28 ENCOUNTER — Encounter: Payer: Self-pay | Admitting: Family Medicine

## 2021-09-28 ENCOUNTER — Other Ambulatory Visit: Payer: Self-pay

## 2021-09-28 ENCOUNTER — Ambulatory Visit (INDEPENDENT_AMBULATORY_CARE_PROVIDER_SITE_OTHER): Payer: Medicare Other | Admitting: Family Medicine

## 2021-09-28 VITALS — BP 118/68 | HR 76 | Temp 98.3°F | Ht 66.0 in | Wt 113.0 lb

## 2021-09-28 DIAGNOSIS — N183 Chronic kidney disease, stage 3 unspecified: Secondary | ICD-10-CM | POA: Diagnosis not present

## 2021-09-28 DIAGNOSIS — S8002XA Contusion of left knee, initial encounter: Secondary | ICD-10-CM | POA: Insufficient documentation

## 2021-09-28 HISTORY — DX: Contusion of left knee, initial encounter: S80.02XA

## 2021-09-28 LAB — COMPREHENSIVE METABOLIC PANEL
ALT: 8 IU/L (ref 0–44)
AST: 15 IU/L (ref 0–40)
Albumin/Globulin Ratio: 2.3 — ABNORMAL HIGH (ref 1.2–2.2)
Albumin: 4.2 g/dL (ref 3.5–4.6)
Alkaline Phosphatase: 51 IU/L (ref 44–121)
BUN/Creatinine Ratio: 13 (ref 10–24)
BUN: 15 mg/dL (ref 10–36)
Bilirubin Total: 0.4 mg/dL (ref 0.0–1.2)
CO2: 23 mmol/L (ref 20–29)
Calcium: 9.1 mg/dL (ref 8.6–10.2)
Chloride: 107 mmol/L — ABNORMAL HIGH (ref 96–106)
Creatinine, Ser: 1.17 mg/dL (ref 0.76–1.27)
Globulin, Total: 1.8 g/dL (ref 1.5–4.5)
Glucose: 88 mg/dL (ref 70–99)
Potassium: 4.5 mmol/L (ref 3.5–5.2)
Sodium: 141 mmol/L (ref 134–144)
Total Protein: 6 g/dL (ref 6.0–8.5)
eGFR: 59 mL/min/{1.73_m2} — ABNORMAL LOW (ref >59)

## 2021-09-28 NOTE — Assessment & Plan Note (Signed)
Recheck cmp  

## 2021-09-28 NOTE — Patient Instructions (Signed)
Recommend take tylenol (generic) 1-2 twice a day for one week.  Please put in pill box so pt does not take too much.

## 2021-09-28 NOTE — Assessment & Plan Note (Addendum)
No xray needed.  Recommend take tylenol (generic) 1-2 twice a day for one week.  Please put in pill box so pt does not take too much.

## 2021-10-08 ENCOUNTER — Other Ambulatory Visit: Payer: Self-pay | Admitting: Cardiology

## 2021-10-08 DIAGNOSIS — E782 Mixed hyperlipidemia: Secondary | ICD-10-CM

## 2021-10-31 ENCOUNTER — Other Ambulatory Visit: Payer: Self-pay | Admitting: Legal Medicine

## 2021-10-31 DIAGNOSIS — G309 Alzheimer's disease, unspecified: Secondary | ICD-10-CM

## 2021-10-31 DIAGNOSIS — E039 Hypothyroidism, unspecified: Secondary | ICD-10-CM

## 2021-11-02 ENCOUNTER — Other Ambulatory Visit: Payer: Self-pay | Admitting: Legal Medicine

## 2021-11-27 ENCOUNTER — Telehealth: Payer: Self-pay

## 2021-11-27 NOTE — Progress Notes (Signed)
Appt was reminded of appt with CPP on 3/21 ? ?Elray Mcgregor, CMA ?Clinical Pharmacist Assistant  ?[phone]  ?

## 2021-12-01 ENCOUNTER — Ambulatory Visit (INDEPENDENT_AMBULATORY_CARE_PROVIDER_SITE_OTHER): Payer: Medicare Other

## 2021-12-01 ENCOUNTER — Other Ambulatory Visit: Payer: Self-pay

## 2021-12-01 DIAGNOSIS — I10 Essential (primary) hypertension: Secondary | ICD-10-CM

## 2021-12-01 DIAGNOSIS — I482 Chronic atrial fibrillation, unspecified: Secondary | ICD-10-CM

## 2021-12-01 DIAGNOSIS — I2510 Atherosclerotic heart disease of native coronary artery without angina pectoris: Secondary | ICD-10-CM

## 2021-12-01 NOTE — Patient Instructions (Signed)
Visit Information ? ? Goals Addressed   ?None ?  ? ?Patient Care Plan: ccm pharmacy care plan  ?  ? ?Problem Identified: htn, hld, dm, copd, hypothyroidism   ?Priority: High  ?Onset Date: 01/01/2021  ?  ? ?Long-Range Goal: Disease State Management   ?Start Date: 01/01/2021  ?Expected End Date: 01/01/2022  ?Recent Progress: On track  ?Priority: High  ?Note:   ? ?Current Barriers:  ?Unable to achieve control of shortness of breath  ? ?Pharmacist Clinical Goal(s):  ?Patient will maintain control of COPD  as evidenced by shortness of breath  through collaboration with PharmD and provider.  ? ?Interventions: ?1:1 collaboration with Lillard Anes, MD regarding development and update of comprehensive plan of care as evidenced by provider attestation and co-signature ?Inter-disciplinary care team collaboration (see longitudinal plan of care) ?Comprehensive medication review performed; medication list updated in electronic medical record ? ?Hypertension (BP goal <140/90) ?BP Readings from Last 3 Encounters:  ?09/28/21 118/68  ?08/31/21 102/66  ?07/15/21 104/64  ?-Controlled ?-Current treatment: ?verapamil CR 240 mg daily Appropriate, Effective, Safe, Accessible ?-Medications previously tried: none reported  ?-Current home readings:  doesn't have a machine to check bp at home  ?-Current dietary habits:  Does not enjoy boost or ensure. Eats a lot of hamburger for protein source. Doesn't drink water. Drinks decaf coffee with each meal. Some water between. Doesn't prefer chicken. Eats hamburgers and hot dogs. Eats fruits and vegetables.  ?-Current exercise habits: Walks about mile most days as long as it's not too cold or raining.  ?-Denies hypotensive/hypertensive symptoms ?-Educated on BP goals and benefits of medications for prevention of heart attack, stroke and kidney damage; ?Daily salt intake goal < 2300 mg; ?Exercise goal of 150 minutes per week; ?Importance of home blood pressure monitoring; ?-Counseled to monitor  BP at home as needed, document, and provide log at future appointments ?-Counseled on diet and exercise extensively ?Recommended to continue current medication ?Recommended ordering a blood pressure monitor through [employer].  ? ?Hyperlipidemia: (LDL goal < 100) ? ?-Controlled ?-Current treatment: ?simvastatin 20 mg daily Appropriate, Effective, Safe, Accessible ?-Medications previously tried: none reported  ?-Current dietary patterns: doesn't like chicken. Eats ground beef, hamburgers and hot dogs.  ?-Current exercise habits: walks 1-2 miles most days.  ?-Educated on Cholesterol goals;  ?Benefits of statin for ASCVD risk reduction; ?Importance of limiting foods high in cholesterol; ?Exercise goal of 150 minutes per week; ?-Counseled on diet and exercise extensively ?April 2022: Recommended consider changing to atorvastatin or rosuvastatin due to interaction with verapamil.  ? ?Atrial Fibrillation (Goal: prevent stroke and major bleeding) ?-Controlled ?-CHADSVASC: 5 ?-Current treatment: ?Rate control: verapamil cr 240 mg daily Appropriate, Effective, Safe, Accessible ?Anticoagulation: nothing currently ?-Medications previously tried: not reported ?-Home BP and HR readings: not checking at home   ?-Counseled on increased risk of stroke due to Afib and benefits of anticoagulation for stroke prevention; ?-Counseled on diet and exercise extensively ?Recommended to continue current medication ? ? ?Alzheimer Disease (Goal: maintain memory) ?-Controlled ?-Current treatment  ?memantine 5 mg bid Appropriate, Effective, Safe, Accessible ?-Medications previously tried: donepezil  ?-Recommended to continue current medication  ? ? ?Patient Goals/Self-Care Activities ?Patient will:  ?- take medications as prescribed ?focus on medication adherence by using pill box ?target a minimum of 150 minutes of moderate intensity exercise weekly ? ?Follow Up Plan: Telephone follow up appointment with care management team member  scheduled for: October 2023 ? ?[place] Constable, Pharm.D. - [phone] ? ?  ? ? ?  Mr. Heinz was given information about Chronic Care Management services today including:  ?CCM service includes personalized support from designated clinical staff supervised by his physician, including individualized plan of care and coordination with other care providers ?24/7 contact phone numbers for assistance for urgent and routine care needs. ?Standard insurance, coinsurance, copays and deductibles apply for chronic care management only during months in which we provide at least 20 minutes of these services. Most insurances cover these services at 100%, however patients may be responsible for any copay, coinsurance and/or deductible if applicable. This service may help you avoid the need for more expensive face-to-face services. ?Only one practitioner may furnish and bill the service in a calendar month. ?The patient may stop CCM services at any time (effective at the end of the month) by phone call to the office staff. ? ?Patient agreed to services and verbal consent obtained.  ? ?The patient verbalized understanding of instructions, educational materials, and care plan provided today and declined offer to receive copy of patient instructions, educational materials, and care plan.  ?The pharmacy team will reach out to the patient again over the next 90 days.  ? ?Lane Hacker, [place] ? ?

## 2021-12-01 NOTE — Progress Notes (Signed)
? ?Chronic Care Management ?Pharmacy Note ? ?12/01/2021 ?Name:  Lucas Kline MRN:  [id number] DOB:  10-17-1928  ? ?Plan Updates:  ?Patient is having trouble swallowing, per daughter. Would like throat evaluated at in person visit next month ? ?Subjective: ?Lucas Kline is an 88 y.o. year old male who is a primary patient of Cox, Kirsten, MD.  The CCM team was consulted for assistance with disease management and care coordination needs.   ? ?Engaged with patient by telephone for follow up visit in response to provider referral for pharmacy case management and/or care coordination services.  ? ?Consent to Services:  ?The patient was given the following information about Chronic Care Management services today, agreed to services, and gave verbal consent: 1. CCM service includes personalized support from designated clinical staff supervised by the primary care provider, including individualized plan of care and coordination with other care providers 2. 24/7 contact phone numbers for assistance for urgent and routine care needs. 3. Service will only be billed when office clinical staff spend 20 minutes or more in a month to coordinate care. 4. Only one practitioner may furnish and bill the service in a calendar month. 5.The patient may stop CCM services at any time (effective at the end of the month) by phone call to the office staff. 6. The patient will be responsible for cost sharing (co-pay) of up to 20% of the service fee (after annual deductible is met). Patient agreed to services and consent obtained. ? ?Patient Care Team: ?Rochel Brome, MD as PCP - General (Internal Medicine) ?Lane Hacker, [facility] (Pharmacist) ? ?Recent office visits:  ?07/01/21 Marge Duncans PA-C Seen for neck pain. Started on Prednisone 20 mg 2 times daily. ?  ?Recent consult visits:  ?None  ?  ?Hospital visits:  ?None ? ?Objective: ? ?Lab Results  ?Component Value Date  ? CREATININE 1.17 09/28/2021  ? BUN 15 09/28/2021  ? GFRNONAA  50 (L) 10/23/2020  ? GFRAA 58 (L) 10/23/2020  ? NA 141 09/28/2021  ? K 4.5 09/28/2021  ? CALCIUM 9.1 09/28/2021  ? CO2 23 09/28/2021  ? GLUCOSE 88 09/28/2021  ? ? ?No results found for: HGBA1C, FRUCTOSAMINE, GFR, MICROALBUR  ?Last diabetic Eye exam: No results found for: HMDIABEYEEXA  ?Last diabetic Foot exam: No results found for: HMDIABFOOTEX  ? ?Lab Results  ?Component Value Date  ? CHOL 161 08/31/2021  ? HDL 63 08/31/2021  ? [place] 83 08/31/2021  ? TRIG 80 08/31/2021  ? CHOLHDL 2.6 08/31/2021  ? ? ?Hepatic Function Latest Ref Rng & Units 09/28/2021 08/31/2021 07/01/2021  ?Total Protein 6.0 - 8.5 g/dL 6.0 6.4 6.3  ?Albumin 3.5 - 4.6 g/dL 4.2 4.2 4.2  ?AST 0 - 40 IU/L '15 17 13  ' ?ALT 0 - 44 IU/L '8 10 9  ' ?Alk Phosphatase 44 - 121 IU/L 51 62 55  ?Total Bilirubin 0.0 - 1.2 mg/dL 0.4 0.4 0.4  ?Bilirubin, Direct 0.0 - 0.2 mg/dL - - -  ? ? ?Lab Results  ?Component Value Date/Time  ? TSH 4.720 (H) 08/31/2021 08:58 AM  ? TSH 2.590 02/23/2021 08:41 AM  ? ? ?CBC Latest Ref Rng & Units 08/31/2021 07/01/2021 02/23/2021  ?WBC 3.4 - 10.8 x10E3/uL 5.4 5.7 5.3  ?Hemoglobin 13.0 - 17.7 g/dL 13.1 12.9(L) 12.3(L)  ?Hematocrit 37.5 - 51.0 % 38.4 35.8(L) 36.8(L)  ?Platelets 150 - 450 x10E3/uL 199 189 168  ? ? ?No results found for: VD25OH ? ?Clinical ASCVD: Yes  ?The ASCVD Risk score (Arnett  DK, et al., 2019) failed to calculate for the following reasons: ?  The 2019 ASCVD risk score is only valid for ages 2 to 61   ? ?Depression screen [facility] 2/9 09/08/2020 01/21/2020 01/21/2020  ?Decreased Interest 0 0 0  ?Down, Depressed, Hopeless 0 0 0  ?PHQ - 2 Score 0 0 0  ?  ? ?Social History  ? ?Tobacco Use  ?Smoking Status Former  ? Types: Cigarettes  ? Quit date: 2008  ? Years since quitting: 15.2  ?Smokeless Tobacco Current  ? Types: Chew  ?Tobacco Comments  ? 1 pouch sometimes  ? ?BP Readings from Last 3 Encounters:  ?09/28/21 118/68  ?08/31/21 102/66  ?07/15/21 104/64  ? ?Pulse Readings from Last 3 Encounters:  ?09/28/21 76  ?08/31/21 81   ?07/15/21 76  ? ?Wt Readings from Last 3 Encounters:  ?09/28/21 113 lb (51.3 kg)  ?08/31/21 112 lb 9.6 oz (51.1 kg)  ?07/15/21 112 lb (50.8 kg)  ? ?BMI Readings from Last 3 Encounters:  ?09/28/21 18.24 kg/m?  ?08/31/21 18.17 kg/m?  ?07/15/21 18.08 kg/m?  ? ? ?Assessment/Interventions: Review of patient past medical history, allergies, medications, health status, including review of consultants reports, laboratory and other test data, was performed as part of comprehensive evaluation and provision of chronic care management services.  ? ?SDOH:  (Social Determinants of Health) assessments and interventions performed: Yes ?SDOH Interventions   ? ?Flowsheet Row Most Recent Value  ?SDOH Interventions   ?Financial Strain Interventions Intervention Not Indicated  ?Transportation Interventions Intervention Not Indicated  ? ?  ? ?SDOH Screenings  ? ?Alcohol Screen: Not on file  ?Depression (PHQ2-9): Not on file  ?Financial Resource Strain: Low Risk   ? Difficulty of Paying Living Expenses: Not hard at all  ?Food Insecurity: Not on file  ?Housing: Low Risk   ? Last Housing Risk Score: 0  ?Physical Activity: Not on file  ?Social Connections: Not on file  ?Stress: Not on file  ?Tobacco Use: High Risk  ? Smoking Tobacco Use: Former  ? Smokeless Tobacco Use: Current  ? Passive Exposure: Not on file  ?Transportation Needs: No Transportation Needs  ? Lack of Transportation (Medical): No  ? Lack of Transportation (Non-Medical): No  ? ? ?CCM Care Plan ? ?Allergies  ?Allergen Reactions  ? Aricept [Donepezil] Other (See Comments)  ?  Abdominal pain, diarrhea  ? ? ?Medications Reviewed Today   ? ? Reviewed by Lane Hacker, [facility] (Pharmacist) on 12/01/21 at 1631  Med List Status: <None>  ? ?Medication Order Taking? Sig Documenting Provider Last Dose Status Informant  ?cyanocobalamin (,VITAMIN B-12,) 1000 MCG/ML injection [id number]  INJECT 1 ML (1,000 MCG TOTAL) INTO THE MUSCLE EVERY 30 DAYS. Lillard Anes, MD  Active    ?Glycerin, Laxative, (FLEET LIQUID GLYCERIN SUPP RE) [id number] No Place rectally daily as needed. [provider] Taking Active   ?levothyroxine (SYNTHROID) 50 MCG tablet [id number]  TAKE 1 TABLET BY MOUTH EVERY DAY Lillard Anes, MD  Active   ?Magnesium Hydroxide (DULCOLAX PO) [id number] No Take 1 tablet by mouth daily as needed. [provider] Taking Active   ?memantine (NAMENDA) 5 MG tablet [id number]  TAKE 1 TABLET BY MOUTH TWICE A DAY Lillard Anes, MD  Active   ?simvastatin (ZOCOR) 20 MG tablet [id number]  TAKE 1 TABLET BY MOUTH EVERY DAY Revankar, Reita Cliche, MD  Active   ?Syringe/Needle, Disp, (SYRINGE 3CC/25GX1") 25G X 1" 3 ML MISC [id number] No To administer B12 monthly Marge Duncans, PA-C  Taking Active   ?verapamil (CALAN-SR) 240 MG CR tablet [id number]  TAKE 1 TABLET BY MOUTH EVERY DAY Lillard Anes, MD  Active   ? ?  ?  ? ?  ? ? ?Patient Active Problem List  ? Diagnosis Date Noted  ? Contusion of left knee 09/28/2021  ? Neck pain 07/01/2021  ? Essential hypertension, benign   ? Memory deficit 09/08/2020  ? B12 deficiency   ? Essential tremor   ? Mixed hearing loss, bilateral   ? Bilateral carotid bruits 01/28/2020  ? Primary hypothyroidism 01/22/2020  ? Unstable gait 01/21/2020  ? Obstructive chronic bronchitis without exacerbation ([place]) 01/21/2020  ? Malnutrition of moderate degree ([place]) 01/21/2020  ? Chronic atrial fibrillation ([place]) 01/21/2020  ? Personal history of malignant neoplasm of larynx 01/18/2020  ? Osteoarthritis   ? Alzheimer disease ([place])   ? CKD (chronic kidney disease), stage III ([place])   ? Atherosclerotic heart disease of native coronary artery without angina pectoris   ? Mixed hyperlipidemia   ? ? ?Immunization History  ?Administered Date(s) Administered  ? Pneumococcal Conjugate-13 01/17/2014  ? Pneumococcal Polysaccharide-23 01/17/2014  ? Tdap 12/29/2011  ? ? ?Conditions to be addressed/monitored:  ?Hypertension, Hyperlipidemia, Diabetes, COPD and  Hypothyroidism ? ?Care Plan : ccm pharmacy care plan  ?Updates made by Lane Hacker, RPH since 12/01/2021 12:00 AM  ?  ? ?Problem: htn, hld, dm, copd, hypothyroidism   ?Priority: High  ?Onset Date: 01/01/2021

## 2021-12-11 DIAGNOSIS — I4891 Unspecified atrial fibrillation: Secondary | ICD-10-CM

## 2021-12-11 DIAGNOSIS — E785 Hyperlipidemia, unspecified: Secondary | ICD-10-CM

## 2021-12-11 DIAGNOSIS — I10 Essential (primary) hypertension: Secondary | ICD-10-CM | POA: Diagnosis not present

## 2021-12-11 DIAGNOSIS — G309 Alzheimer's disease, unspecified: Secondary | ICD-10-CM

## 2021-12-21 NOTE — Progress Notes (Signed)
? ?Subjective:  ?Patient ID: Lucas Kline, male    DOB: Jan 07, 1929  Age: 88 y.o. MRN: [id number] ? ?Chief Complaint  ?Patient presents with  ? Hyperlipidemia  ? Hypertension  ? Hypothyroidism  ? ? ?HPI: chronic visit ? Patient presents for follow up of hypertension.  Patient tolerating Verapamil 240 mg daily well without side effects.  Patient was diagnosed with hypertension and has been treated for hypertension for 12 years.Patient is working on maintaining diet and exercise regimen and follows up as directed.   ? ?Patient presents with hyperlipidemia.  Compliance with treatment has been good; patient takes medicines as directed, maintains low cholesterol diet, follows up as directed, and maintains exercise regimen.  Patient is using Simvastatin 20 mg daily without problems.  ? ?Dementia: He takes Memantine 5 mg daily. ?DAT he forgets names and doesn't get confused.lives with son . He works around the house ? ?He is having progressive dysphagia and has not had recently cancer checkup. Weight stable ? ?Hypothyroidism: Patient is taking Levothyroxine 50 mcg daily. ?Current Outpatient Medications on File Prior to Visit  ?Medication Sig Dispense Refill  ? cyanocobalamin (,VITAMIN B-12,) 1000 MCG/ML injection INJECT 1 ML (1,000 MCG TOTAL) INTO THE MUSCLE EVERY 30 DAYS. 3 mL 6  ? Glycerin, Laxative, (FLEET LIQUID GLYCERIN SUPP RE) Place rectally daily as needed.    ? levothyroxine (SYNTHROID) 50 MCG tablet TAKE 1 TABLET BY MOUTH EVERY DAY 90 tablet 2  ? Magnesium Hydroxide (DULCOLAX PO) Take 1 tablet by mouth daily as needed.    ? memantine (NAMENDA) 5 MG tablet TAKE 1 TABLET BY MOUTH TWICE A DAY 180 tablet 2  ? simvastatin (ZOCOR) 20 MG tablet TAKE 1 TABLET BY MOUTH EVERY DAY 90 tablet 3  ? Syringe/Needle, Disp, (SYRINGE 3CC/25GX1") 25G X 1" 3 ML MISC To administer B12 monthly 25 each 0  ? verapamil (CALAN-SR) 240 MG CR tablet TAKE 1 TABLET BY MOUTH EVERY DAY 90 tablet 2  ? ?No current facility-administered  medications on file prior to visit.  ? ?Past Medical History:  ?Diagnosis Date  ? Abnormal EKG 01/28/2020  ? Adult BMI <19 kg/sq m 01/21/2020  ? Alzheimer disease ([place])   ? Atherosclerotic heart disease of native coronary artery without angina pectoris   ? CHRONIC   ? B12 deficiency   ? CHRONIC  ? Bilateral carotid bruits 01/28/2020  ? Chronic atrial fibrillation ([place]) 01/21/2020  ? CKD (chronic kidney disease), stage II   ? MILD  ? CKD (chronic kidney disease), stage III ([place])   ? MODERATE (CHRONIC)  ? Coronary atherosclerosis   ? CHRONIC  ? Essential hypertension 01/28/2020  ? Essential hypertension, benign   ? Essential tremor   ? CHRONIC  ? Malnutrition of moderate degree ([place]) 01/21/2020  ? Memory deficit 09/08/2020  ? Mixed hearing loss, bilateral   ? CHRONIC  ? Mixed hyperlipidemia   ? CHRONIC   ? Obstructive chronic bronchitis without exacerbation ([place]) 01/21/2020  ? Osteoarthritis   ? OF KNEE CHRONIC  ? Personal history of malignant neoplasm of larynx 01/18/2020  ? Primary hypothyroidism 01/22/2020  ? Routine general medical examination at a health care facility 09/08/2020  ? Unspecified arthropathy, lower leg   ? Unspecified constipation   ? CHRONIC  ? Unstable gait 01/21/2020  ? ?Past Surgical History:  ?Procedure Laterality Date  ? CATARACT EXTRACTION, BILATERAL    ?  ?Family History  ?Problem Relation Age of Onset  ? Hypertension Neg Hx   ? Heart disease  Neg Hx   ? Cancer Neg Hx   ? Diabetes Neg Hx   ? ?Social History  ? ?Socioeconomic History  ? Marital status: Widowed  ?  Spouse name: Not on file  ? Number of children: 2  ? Years of education: Not on file  ? Highest education level: Not on file  ?Occupational History  ? Occupation: retired  ? Occupation: Copywriter, advertising  ?Tobacco Use  ? Smoking status: Former  ?  Types: Cigarettes  ?  Quit date: 2008  ?  Years since quitting: 15.2  ? Smokeless tobacco: Current  ?  Types: Chew  ? Tobacco comments:  ?  1 pouch sometimes  ?Vaping Use  ? Vaping Use: Never used   ?Substance and Sexual Activity  ? Alcohol use: No  ? Drug use: No  ? Sexual activity: Yes  ?Other Topics Concern  ? Not on file  ?Social History Narrative  ? Not on file  ? ?Social Determinants of Health  ? ?Financial Resource Strain: Low Risk   ? Difficulty of Paying Living Expenses: Not hard at all  ?Food Insecurity: Not on file  ?Transportation Needs: No Transportation Needs  ? Lack of Transportation (Medical): No  ? Lack of Transportation (Non-Medical): No  ?Physical Activity: Not on file  ?Stress: Not on file  ?Social Connections: Not on file  ? ? ?Review of Systems  ?Constitutional:  Negative for chills, fatigue, fever and unexpected weight change.  ?HENT:  Negative for congestion, rhinorrhea, sinus pressure, sneezing and sore throat.   ?Eyes:  Negative for discharge and visual disturbance.  ?Respiratory:  Negative for cough, shortness of breath and wheezing.   ?Cardiovascular:  Negative for chest pain and palpitations.  ?Gastrointestinal:  Negative for abdominal pain, diarrhea, nausea and vomiting.  ?Endocrine: Negative for polydipsia, polyphagia and polyuria.  ?Genitourinary:  Negative for decreased urine volume, difficulty urinating, dysuria, frequency, penile swelling and urgency.  ?Musculoskeletal:  Negative for back pain, gait problem, joint swelling, neck pain and neck stiffness.  ?Skin: Negative.   ?Neurological:  Negative for dizziness, seizures, weakness, numbness and headaches.  ?Psychiatric/Behavioral:  Negative for confusion, hallucinations, sleep disturbance and suicidal ideas. The patient is not nervous/anxious and is not hyperactive.   ? ? ?Objective:  ?BP 128/76   Pulse 73   Temp 97.8 ?F (36.6 ?C)   Ht '5\' 6"'$  (1.676 m)   Wt 114 lb (51.7 kg)   SpO2 95%   BMI 18.40 kg/m?  ? ? ?  12/22/2021  ?  9:12 AM 09/28/2021  ?  9:56 AM 08/31/2021  ?  8:03 AM  ?BP/Weight  ?Systolic BP 993 570 177  ?Diastolic BP 76 68 66  ?Wt. (Lbs) 114 113 112.6  ?BMI 18.4 kg/m2 18.24 kg/m2 18.17 kg/m2  ? ? ?Physical  Exam ?Vitals reviewed.  ?Constitutional:   ?   General: He is not in acute distress. ?   Appearance: Normal appearance.  ?   Comments: asthenic  ?HENT:  ?   Head: Normocephalic.  ?   Right Ear: Tympanic membrane normal.  ?   Left Ear: Tympanic membrane normal.  ?   Nose: Nose normal.  ?   Comments: Snuff in mouth ?   Mouth/Throat:  ?   Mouth: Mucous membranes are moist.  ?   Pharynx: Oropharynx is clear.  ?Eyes:  ?   Extraocular Movements: Extraocular movements intact.  ?   Conjunctiva/sclera: Conjunctivae normal.  ?   Pupils: Pupils are equal, round, and reactive to  light.  ?   Comments: glasses  ?Neck:  ?   Vascular: No carotid bruit.  ?Cardiovascular:  ?   Rate and Rhythm: Normal rate and regular rhythm.  ?   Pulses: Normal pulses.  ?   Heart sounds: Normal heart sounds. No murmur heard. ?  No gallop.  ?Pulmonary:  ?   Effort: Pulmonary effort is normal. No respiratory distress.  ?   Breath sounds: Normal breath sounds. No wheezing.  ?Abdominal:  ?   General: Abdomen is flat. Bowel sounds are normal. There is no distension.  ?   Palpations: Abdomen is soft.  ?   Tenderness: There is no abdominal tenderness.  ?Musculoskeletal:  ?   Cervical back: Normal range of motion.  ?   Right lower leg: No edema.  ?   Left lower leg: No edema.  ?   Comments: Muscle wasting  ?Skin: ?   General: Skin is warm and dry.  ?   Capillary Refill: Capillary refill takes less than 2 seconds.  ?   Findings: No lesion.  ?   Comments: Nonhealing lesion left side of nose for > 6 months  ?Neurological:  ?   General: No focal deficit present.  ?   Mental Status: He is alert.  ?   Gait: Gait normal.  ?   Deep Tendon Reflexes: Reflexes normal.  ?Psychiatric:     ?   Mood and Affect: Mood normal.  ? ? ? ?  ? ?Lab Results  ?Component Value Date  ? WBC 5.4 08/31/2021  ? HGB 13.1 08/31/2021  ? HCT 38.4 08/31/2021  ? PLT 199 08/31/2021  ? GLUCOSE 88 09/28/2021  ? CHOL 161 08/31/2021  ? TRIG 80 08/31/2021  ? HDL 63 08/31/2021  ? [place] 83 08/31/2021   ? ALT 8 09/28/2021  ? AST 15 09/28/2021  ? NA 141 09/28/2021  ? K 4.5 09/28/2021  ? CL 107 (H) 09/28/2021  ? CREATININE 1.17 09/28/2021  ? BUN 15 09/28/2021  ? CO2 23 09/28/2021  ? TSH 4.720 (H) 08/31/2021  ? ? ? ? ?A

## 2021-12-22 ENCOUNTER — Ambulatory Visit (INDEPENDENT_AMBULATORY_CARE_PROVIDER_SITE_OTHER): Payer: Medicare Other | Admitting: Legal Medicine

## 2021-12-22 ENCOUNTER — Encounter: Payer: Self-pay | Admitting: Legal Medicine

## 2021-12-22 VITALS — BP 128/76 | HR 73 | Temp 97.8°F | Ht 66.0 in | Wt 114.0 lb

## 2021-12-22 DIAGNOSIS — I10 Essential (primary) hypertension: Secondary | ICD-10-CM | POA: Diagnosis not present

## 2021-12-22 DIAGNOSIS — G309 Alzheimer's disease, unspecified: Secondary | ICD-10-CM | POA: Diagnosis not present

## 2021-12-22 DIAGNOSIS — E039 Hypothyroidism, unspecified: Secondary | ICD-10-CM | POA: Diagnosis not present

## 2021-12-22 DIAGNOSIS — E782 Mixed hyperlipidemia: Secondary | ICD-10-CM

## 2021-12-22 DIAGNOSIS — R1312 Dysphagia, oropharyngeal phase: Secondary | ICD-10-CM

## 2021-12-22 DIAGNOSIS — Z8521 Personal history of malignant neoplasm of larynx: Secondary | ICD-10-CM | POA: Diagnosis not present

## 2021-12-22 DIAGNOSIS — E440 Moderate protein-calorie malnutrition: Secondary | ICD-10-CM | POA: Diagnosis not present

## 2021-12-22 DIAGNOSIS — I482 Chronic atrial fibrillation, unspecified: Secondary | ICD-10-CM | POA: Diagnosis not present

## 2021-12-22 DIAGNOSIS — I2510 Atherosclerotic heart disease of native coronary artery without angina pectoris: Secondary | ICD-10-CM

## 2021-12-22 DIAGNOSIS — L989 Disorder of the skin and subcutaneous tissue, unspecified: Secondary | ICD-10-CM

## 2021-12-22 DIAGNOSIS — J449 Chronic obstructive pulmonary disease, unspecified: Secondary | ICD-10-CM | POA: Diagnosis not present

## 2021-12-22 DIAGNOSIS — Z681 Body mass index (BMI) 19 or less, adult: Secondary | ICD-10-CM

## 2021-12-22 DIAGNOSIS — H906 Mixed conductive and sensorineural hearing loss, bilateral: Secondary | ICD-10-CM

## 2021-12-22 DIAGNOSIS — N1831 Chronic kidney disease, stage 3a: Secondary | ICD-10-CM

## 2021-12-22 DIAGNOSIS — F0280 Dementia in other diseases classified elsewhere without behavioral disturbance: Secondary | ICD-10-CM

## 2021-12-23 LAB — CBC WITH DIFFERENTIAL/PLATELET
Basophils Absolute: 0.1 10*3/uL (ref 0.0–0.2)
Basos: 1 %
EOS (ABSOLUTE): 0.2 10*3/uL (ref 0.0–0.4)
Eos: 5 %
Hematocrit: 36.3 % — ABNORMAL LOW (ref 37.5–51.0)
Hemoglobin: 12.5 g/dL — ABNORMAL LOW (ref 13.0–17.7)
Immature Grans (Abs): 0 10*3/uL (ref 0.0–0.1)
Immature Granulocytes: 0 %
Lymphocytes Absolute: 1.1 10*3/uL (ref 0.7–3.1)
Lymphs: 23 %
MCH: 32.7 pg (ref 26.6–33.0)
MCHC: 34.4 g/dL (ref 31.5–35.7)
MCV: 95 fL (ref 79–97)
Monocytes Absolute: 0.5 10*3/uL (ref 0.1–0.9)
Monocytes: 11 %
Neutrophils Absolute: 2.7 10*3/uL (ref 1.4–7.0)
Neutrophils: 60 %
Platelets: 165 10*3/uL (ref 150–450)
RBC: 3.82 x10E6/uL — ABNORMAL LOW (ref 4.14–5.80)
RDW: 12.8 % (ref 11.6–15.4)
WBC: 4.6 10*3/uL (ref 3.4–10.8)

## 2021-12-23 LAB — LIPID PANEL
Chol/HDL Ratio: 2.5 ratio (ref 0.0–5.0)
Cholesterol, Total: 143 mg/dL (ref 100–199)
HDL: 58 mg/dL (ref >39)
LDL Chol Calc (NIH): 72 mg/dL (ref 0–99)
Triglycerides: 61 mg/dL (ref 0–149)
VLDL Cholesterol Cal: 13 mg/dL (ref 5–40)

## 2021-12-23 LAB — COMPREHENSIVE METABOLIC PANEL
ALT: 13 IU/L (ref 0–44)
AST: 17 IU/L (ref 0–40)
Albumin/Globulin Ratio: 2 (ref 1.2–2.2)
Albumin: 4.1 g/dL (ref 3.5–4.6)
Alkaline Phosphatase: 51 IU/L (ref 44–121)
BUN/Creatinine Ratio: 16 (ref 10–24)
BUN: 20 mg/dL (ref 10–36)
Bilirubin Total: 0.5 mg/dL (ref 0.0–1.2)
CO2: 23 mmol/L (ref 20–29)
Calcium: 8.8 mg/dL (ref 8.6–10.2)
Chloride: 106 mmol/L (ref 96–106)
Creatinine, Ser: 1.27 mg/dL (ref 0.76–1.27)
Globulin, Total: 2.1 g/dL (ref 1.5–4.5)
Glucose: 76 mg/dL (ref 70–99)
Potassium: 4.6 mmol/L (ref 3.5–5.2)
Sodium: 140 mmol/L (ref 134–144)
Total Protein: 6.2 g/dL (ref 6.0–8.5)
eGFR: 54 mL/min/{1.73_m2} — ABNORMAL LOW (ref >59)

## 2021-12-23 LAB — TSH: TSH: 2.01 u[IU]/mL (ref 0.450–4.500)

## 2021-12-23 LAB — CARDIOVASCULAR RISK ASSESSMENT

## 2021-12-23 NOTE — Progress Notes (Signed)
Egfr 56 stage 3a kidney disease, potassium ok, liver tests normal, anemia of chronic disease, Cholesterol normal, TSH now normal 2.010,  ?lp

## 2021-12-25 DIAGNOSIS — Z20822 Contact with and (suspected) exposure to covid-19: Secondary | ICD-10-CM | POA: Diagnosis not present

## 2021-12-25 DIAGNOSIS — G309 Alzheimer's disease, unspecified: Secondary | ICD-10-CM | POA: Diagnosis not present

## 2021-12-25 DIAGNOSIS — I10 Essential (primary) hypertension: Secondary | ICD-10-CM | POA: Diagnosis not present

## 2021-12-25 DIAGNOSIS — N289 Disorder of kidney and ureter, unspecified: Secondary | ICD-10-CM | POA: Diagnosis not present

## 2021-12-25 DIAGNOSIS — R079 Chest pain, unspecified: Secondary | ICD-10-CM | POA: Diagnosis not present

## 2021-12-25 DIAGNOSIS — F17220 Nicotine dependence, chewing tobacco, uncomplicated: Secondary | ICD-10-CM | POA: Diagnosis not present

## 2021-12-25 DIAGNOSIS — J449 Chronic obstructive pulmonary disease, unspecified: Secondary | ICD-10-CM | POA: Diagnosis not present

## 2021-12-25 DIAGNOSIS — E860 Dehydration: Secondary | ICD-10-CM | POA: Diagnosis not present

## 2021-12-25 DIAGNOSIS — R1030 Lower abdominal pain, unspecified: Secondary | ICD-10-CM | POA: Diagnosis not present

## 2021-12-25 DIAGNOSIS — I2510 Atherosclerotic heart disease of native coronary artery without angina pectoris: Secondary | ICD-10-CM | POA: Diagnosis not present

## 2021-12-25 DIAGNOSIS — I4891 Unspecified atrial fibrillation: Secondary | ICD-10-CM | POA: Diagnosis not present

## 2021-12-25 DIAGNOSIS — Z888 Allergy status to other drugs, medicaments and biological substances status: Secondary | ICD-10-CM | POA: Diagnosis not present

## 2021-12-27 DIAGNOSIS — Z20822 Contact with and (suspected) exposure to covid-19: Secondary | ICD-10-CM | POA: Diagnosis not present

## 2021-12-27 DIAGNOSIS — R531 Weakness: Secondary | ICD-10-CM | POA: Diagnosis not present

## 2021-12-27 DIAGNOSIS — E785 Hyperlipidemia, unspecified: Secondary | ICD-10-CM | POA: Diagnosis not present

## 2021-12-27 DIAGNOSIS — M1990 Unspecified osteoarthritis, unspecified site: Secondary | ICD-10-CM | POA: Diagnosis not present

## 2021-12-27 DIAGNOSIS — A419 Sepsis, unspecified organism: Secondary | ICD-10-CM | POA: Diagnosis not present

## 2021-12-27 DIAGNOSIS — I129 Hypertensive chronic kidney disease with stage 1 through stage 4 chronic kidney disease, or unspecified chronic kidney disease: Secondary | ICD-10-CM | POA: Diagnosis not present

## 2021-12-27 DIAGNOSIS — E039 Hypothyroidism, unspecified: Secondary | ICD-10-CM | POA: Diagnosis not present

## 2021-12-27 DIAGNOSIS — Z7952 Long term (current) use of systemic steroids: Secondary | ICD-10-CM | POA: Diagnosis not present

## 2021-12-27 DIAGNOSIS — D539 Nutritional anemia, unspecified: Secondary | ICD-10-CM | POA: Diagnosis not present

## 2021-12-27 DIAGNOSIS — J441 Chronic obstructive pulmonary disease with (acute) exacerbation: Secondary | ICD-10-CM | POA: Diagnosis not present

## 2021-12-27 DIAGNOSIS — R739 Hyperglycemia, unspecified: Secondary | ICD-10-CM | POA: Diagnosis not present

## 2021-12-27 DIAGNOSIS — Z681 Body mass index (BMI) 19 or less, adult: Secondary | ICD-10-CM | POA: Diagnosis not present

## 2021-12-27 DIAGNOSIS — J9601 Acute respiratory failure with hypoxia: Secondary | ICD-10-CM | POA: Diagnosis not present

## 2021-12-27 DIAGNOSIS — J440 Chronic obstructive pulmonary disease with acute lower respiratory infection: Secondary | ICD-10-CM | POA: Diagnosis not present

## 2021-12-27 DIAGNOSIS — E860 Dehydration: Secondary | ICD-10-CM | POA: Diagnosis not present

## 2021-12-27 DIAGNOSIS — Z79899 Other long term (current) drug therapy: Secondary | ICD-10-CM | POA: Diagnosis not present

## 2021-12-27 DIAGNOSIS — Z792 Long term (current) use of antibiotics: Secondary | ICD-10-CM | POA: Diagnosis not present

## 2021-12-27 DIAGNOSIS — Z720 Tobacco use: Secondary | ICD-10-CM | POA: Diagnosis not present

## 2021-12-27 DIAGNOSIS — E876 Hypokalemia: Secondary | ICD-10-CM | POA: Diagnosis not present

## 2021-12-27 DIAGNOSIS — E871 Hypo-osmolality and hyponatremia: Secondary | ICD-10-CM | POA: Diagnosis not present

## 2021-12-27 DIAGNOSIS — E43 Unspecified severe protein-calorie malnutrition: Secondary | ICD-10-CM | POA: Diagnosis not present

## 2021-12-27 DIAGNOSIS — I252 Old myocardial infarction: Secondary | ICD-10-CM | POA: Diagnosis not present

## 2021-12-27 DIAGNOSIS — E7800 Pure hypercholesterolemia, unspecified: Secondary | ICD-10-CM | POA: Diagnosis not present

## 2021-12-27 DIAGNOSIS — N183 Chronic kidney disease, stage 3 unspecified: Secondary | ICD-10-CM | POA: Diagnosis not present

## 2021-12-27 DIAGNOSIS — R0602 Shortness of breath: Secondary | ICD-10-CM | POA: Diagnosis not present

## 2021-12-27 DIAGNOSIS — R1310 Dysphagia, unspecified: Secondary | ICD-10-CM | POA: Diagnosis not present

## 2021-12-31 DIAGNOSIS — G309 Alzheimer's disease, unspecified: Secondary | ICD-10-CM | POA: Diagnosis not present

## 2021-12-31 DIAGNOSIS — I252 Old myocardial infarction: Secondary | ICD-10-CM | POA: Diagnosis not present

## 2021-12-31 DIAGNOSIS — N182 Chronic kidney disease, stage 2 (mild): Secondary | ICD-10-CM | POA: Diagnosis not present

## 2021-12-31 DIAGNOSIS — E538 Deficiency of other specified B group vitamins: Secondary | ICD-10-CM | POA: Diagnosis not present

## 2021-12-31 DIAGNOSIS — I129 Hypertensive chronic kidney disease with stage 1 through stage 4 chronic kidney disease, or unspecified chronic kidney disease: Secondary | ICD-10-CM | POA: Diagnosis not present

## 2021-12-31 DIAGNOSIS — M6281 Muscle weakness (generalized): Secondary | ICD-10-CM | POA: Diagnosis not present

## 2021-12-31 DIAGNOSIS — E43 Unspecified severe protein-calorie malnutrition: Secondary | ICD-10-CM | POA: Diagnosis not present

## 2021-12-31 DIAGNOSIS — Z951 Presence of aortocoronary bypass graft: Secondary | ICD-10-CM | POA: Diagnosis not present

## 2021-12-31 DIAGNOSIS — J440 Chronic obstructive pulmonary disease with acute lower respiratory infection: Secondary | ICD-10-CM | POA: Diagnosis not present

## 2021-12-31 DIAGNOSIS — M1990 Unspecified osteoarthritis, unspecified site: Secondary | ICD-10-CM | POA: Diagnosis not present

## 2021-12-31 DIAGNOSIS — A419 Sepsis, unspecified organism: Secondary | ICD-10-CM | POA: Diagnosis not present

## 2021-12-31 DIAGNOSIS — E7800 Pure hypercholesterolemia, unspecified: Secondary | ICD-10-CM | POA: Diagnosis not present

## 2021-12-31 DIAGNOSIS — R1310 Dysphagia, unspecified: Secondary | ICD-10-CM | POA: Diagnosis not present

## 2021-12-31 DIAGNOSIS — Z9981 Dependence on supplemental oxygen: Secondary | ICD-10-CM | POA: Diagnosis not present

## 2021-12-31 DIAGNOSIS — J9601 Acute respiratory failure with hypoxia: Secondary | ICD-10-CM | POA: Diagnosis not present

## 2021-12-31 DIAGNOSIS — J441 Chronic obstructive pulmonary disease with (acute) exacerbation: Secondary | ICD-10-CM | POA: Diagnosis not present

## 2021-12-31 DIAGNOSIS — E039 Hypothyroidism, unspecified: Secondary | ICD-10-CM | POA: Diagnosis not present

## 2022-01-04 ENCOUNTER — Encounter: Payer: Self-pay | Admitting: Nurse Practitioner

## 2022-01-04 ENCOUNTER — Ambulatory Visit (INDEPENDENT_AMBULATORY_CARE_PROVIDER_SITE_OTHER): Payer: Medicare Other | Admitting: Nurse Practitioner

## 2022-01-04 VITALS — BP 124/62 | HR 68 | Wt 112.0 lb

## 2022-01-04 DIAGNOSIS — E43 Unspecified severe protein-calorie malnutrition: Secondary | ICD-10-CM | POA: Diagnosis not present

## 2022-01-04 DIAGNOSIS — M1990 Unspecified osteoarthritis, unspecified site: Secondary | ICD-10-CM | POA: Diagnosis not present

## 2022-01-04 DIAGNOSIS — I252 Old myocardial infarction: Secondary | ICD-10-CM | POA: Diagnosis not present

## 2022-01-04 DIAGNOSIS — A419 Sepsis, unspecified organism: Secondary | ICD-10-CM | POA: Diagnosis not present

## 2022-01-04 DIAGNOSIS — J189 Pneumonia, unspecified organism: Secondary | ICD-10-CM | POA: Diagnosis not present

## 2022-01-04 DIAGNOSIS — R1310 Dysphagia, unspecified: Secondary | ICD-10-CM | POA: Diagnosis not present

## 2022-01-04 DIAGNOSIS — Z951 Presence of aortocoronary bypass graft: Secondary | ICD-10-CM | POA: Diagnosis not present

## 2022-01-04 DIAGNOSIS — G309 Alzheimer's disease, unspecified: Secondary | ICD-10-CM | POA: Diagnosis not present

## 2022-01-04 DIAGNOSIS — R2681 Unsteadiness on feet: Secondary | ICD-10-CM

## 2022-01-04 DIAGNOSIS — J441 Chronic obstructive pulmonary disease with (acute) exacerbation: Secondary | ICD-10-CM | POA: Diagnosis not present

## 2022-01-04 DIAGNOSIS — Z9981 Dependence on supplemental oxygen: Secondary | ICD-10-CM

## 2022-01-04 DIAGNOSIS — J9611 Chronic respiratory failure with hypoxia: Secondary | ICD-10-CM

## 2022-01-04 DIAGNOSIS — J690 Pneumonitis due to inhalation of food and vomit: Secondary | ICD-10-CM

## 2022-01-04 DIAGNOSIS — E7800 Pure hypercholesterolemia, unspecified: Secondary | ICD-10-CM | POA: Diagnosis not present

## 2022-01-04 DIAGNOSIS — E039 Hypothyroidism, unspecified: Secondary | ICD-10-CM | POA: Diagnosis not present

## 2022-01-04 DIAGNOSIS — J440 Chronic obstructive pulmonary disease with acute lower respiratory infection: Secondary | ICD-10-CM | POA: Diagnosis not present

## 2022-01-04 DIAGNOSIS — Z681 Body mass index (BMI) 19 or less, adult: Secondary | ICD-10-CM

## 2022-01-04 DIAGNOSIS — Z9181 History of falling: Secondary | ICD-10-CM | POA: Diagnosis not present

## 2022-01-04 DIAGNOSIS — E538 Deficiency of other specified B group vitamins: Secondary | ICD-10-CM | POA: Diagnosis not present

## 2022-01-04 DIAGNOSIS — I129 Hypertensive chronic kidney disease with stage 1 through stage 4 chronic kidney disease, or unspecified chronic kidney disease: Secondary | ICD-10-CM | POA: Diagnosis not present

## 2022-01-04 DIAGNOSIS — J9601 Acute respiratory failure with hypoxia: Secondary | ICD-10-CM | POA: Diagnosis not present

## 2022-01-04 DIAGNOSIS — R54 Age-related physical debility: Secondary | ICD-10-CM | POA: Diagnosis not present

## 2022-01-04 DIAGNOSIS — E440 Moderate protein-calorie malnutrition: Secondary | ICD-10-CM | POA: Diagnosis not present

## 2022-01-04 DIAGNOSIS — N182 Chronic kidney disease, stage 2 (mild): Secondary | ICD-10-CM | POA: Diagnosis not present

## 2022-01-04 DIAGNOSIS — M6281 Muscle weakness (generalized): Secondary | ICD-10-CM | POA: Diagnosis not present

## 2022-01-04 MED ORDER — TRELEGY ELLIPTA 100-62.5-25 MCG/ACT IN AEPB: 1.0000 | INHALATION_SPRAY | Freq: Every day | RESPIRATORY_TRACT | 0 refills | Status: DC

## 2022-01-04 MED ORDER — TRELEGY ELLIPTA 100-62.5-25 MCG/ACT IN AEPB: 1.0000 | INHALATION_SPRAY | Freq: Every day | RESPIRATORY_TRACT | 11 refills | Status: DC

## 2022-01-04 NOTE — Progress Notes (Addendum)
? ?Subjective:  ?Patient ID: Lucas Kline, male    DOB: 08/26/1929  Age: 88 y.o. MRN: [id number] ? ?CC: ?Hospital follow-up CAP ? ?HPI ?  ?Follow up Hospitalization ? ?Patient was admitted to [facility] on 12/27/21 and discharged on 12/30/21. ?He was treated for pneumonia and dehydration. ?Treatment for this included IV antibiotics, nebulizer treatments, and IVF. ?He reports fair compliance with treatment. ?He reports this condition is improved.  ? ?Lucas Kline is a 88 year old Caucasian male that presents for hospital follow-up for CAP and dehydration. He is accompanied by his adult grand-daughter who helps supplement history. Grand-daughter reports pt was diagnosed with aspiration pneumonia. She tells me family members assist with patient's care; they have been adding thickener to patient's liquids for nectar consistency to prevent further aspiration due to dysphagia. States he is receiving home health PT and nursing care.Grand-daughter states speech pathology was unavailable for home services. He is wearing portable O2 via [place] due to dyspnea. Pt frail in appearance. Current weight 112 lbs, BMI 18.08. Pt's grand-daughter states Lucas Kline has been increasingly confused, poor oral intake with fluids and food. Grand-daughter is not certain if he has advance directives in place. She has requested a handicap placard for closer parking due to dyspnea with activity. States he has COPD, he is unable to use current Spiriva inhaler due to generalized weakness and decreased dexterity. Grand-daughter states pt has a cane and walker for ambulation, he did not bring either to the office today.  ? ?Current Outpatient Medications on File Prior to Visit  ?Medication Sig Dispense Refill  ?? cyanocobalamin (,VITAMIN B-12,) 1000 MCG/ML injection INJECT 1 ML (1,000 MCG TOTAL) INTO THE MUSCLE EVERY 30 DAYS. 3 mL 6  ?? Glycerin, Laxative, (FLEET LIQUID GLYCERIN SUPP RE) Place rectally daily as needed.    ?? levothyroxine (SYNTHROID) 50  MCG tablet TAKE 1 TABLET BY MOUTH EVERY DAY 90 tablet 2  ?? Magnesium Hydroxide (DULCOLAX PO) Take 1 tablet by mouth daily as needed.    ?? memantine (NAMENDA) 5 MG tablet TAKE 1 TABLET BY MOUTH TWICE A DAY 180 tablet 2  ?? simvastatin (ZOCOR) 20 MG tablet TAKE 1 TABLET BY MOUTH EVERY DAY 90 tablet 3  ?? Syringe/Needle, Disp, (SYRINGE 3CC/25GX1") 25G X 1" 3 ML MISC To administer B12 monthly 25 each 0  ?? verapamil (CALAN-SR) 240 MG CR tablet TAKE 1 TABLET BY MOUTH EVERY DAY 90 tablet 2  ? ?No current facility-administered medications on file prior to visit.  ? ?Past Medical History:  ?Diagnosis Date  ?? Abnormal EKG 01/28/2020  ?? Adult BMI <19 kg/sq m 01/21/2020  ?? Alzheimer disease ([place])   ?? Atherosclerotic heart disease of native coronary artery without angina pectoris   ? CHRONIC   ?? B12 deficiency   ? CHRONIC  ?? Bilateral carotid bruits 01/28/2020  ?? Chronic atrial fibrillation ([place]) 01/21/2020  ?? CKD (chronic kidney disease), stage II   ? MILD  ?? CKD (chronic kidney disease), stage III ([place])   ? MODERATE (CHRONIC)  ?? Coronary atherosclerosis   ? CHRONIC  ?? Essential hypertension 01/28/2020  ?? Essential hypertension, benign   ?? Essential tremor   ? CHRONIC  ?? Malnutrition of moderate degree ([place]) 01/21/2020  ?? Memory deficit 09/08/2020  ?? Mixed hearing loss, bilateral   ? CHRONIC  ?? Mixed hyperlipidemia   ? CHRONIC   ?? Obstructive chronic bronchitis without exacerbation ([place]) 01/21/2020  ?? Osteoarthritis   ? OF KNEE CHRONIC  ?? Personal history of malignant neoplasm of  larynx 01/18/2020  ?? Primary hypothyroidism 01/22/2020  ?? Routine general medical examination at a health care facility 09/08/2020  ?? Unspecified arthropathy, lower leg   ?? Unspecified constipation   ? CHRONIC  ?? Unstable gait 01/21/2020  ? ?Past Surgical History:  ?Procedure Laterality Date  ?? CATARACT EXTRACTION, BILATERAL    ?  ?Family History  ?Problem Relation Age of Onset  ?? Hypertension Neg Hx   ?? Heart disease Neg Hx   ??  Cancer Neg Hx   ?? Diabetes Neg Hx   ? ?Social History  ? ?Socioeconomic History  ?? Marital status: Widowed  ?  Spouse name: Not on file  ?? Number of children: 2  ?? Years of education: Not on file  ?? Highest education level: Not on file  ?Occupational History  ?? Occupation: retired  ?? Occupation: Copywriter, advertising  ?Tobacco Use  ?? Smoking status: Former  ?  Types: Cigarettes  ?  Quit date: 2008  ?  Years since quitting: 15.3  ?? Smokeless tobacco: Current  ?  Types: Chew  ?? Tobacco comments:  ?  1 pouch sometimes  ?Vaping Use  ?? Vaping Use: Never used  ?Substance and Sexual Activity  ?? Alcohol use: No  ?? Drug use: No  ?? Sexual activity: Yes  ?Other Topics Concern  ?? Not on file  ?Social History Narrative  ?? Not on file  ? ?Social Determinants of Health  ? ?Financial Resource Strain: Low Risk   ?? Difficulty of Paying Living Expenses: Not hard at all  ?Food Insecurity: Not on file  ?Transportation Needs: No Transportation Needs  ?? Lack of Transportation (Medical): No  ?? Lack of Transportation (Non-Medical): No  ?Physical Activity: Not on file  ?Stress: Not on file  ?Social Connections: Not on file  ? ? ?Review of Systems  ?Constitutional:  Positive for appetite change (decreased), fatigue and unexpected weight change.  ?Respiratory:  Positive for shortness of breath.   ?Psychiatric/Behavioral:  Positive for confusion.   ? ? ?Objective:  ?BP 124/62   Pulse 68   Wt 112 lb (50.8 kg)   SpO2 93% Comment: on oxygen  BMI 18.08 kg/m?   ? ? ?  12/22/2021  ?  9:12 AM 09/28/2021  ?  9:56 AM 08/31/2021  ?  8:03 AM  ?BP/Weight  ?Systolic BP 314 970 263  ?Diastolic BP 76 68 66  ?Wt. (Lbs) 114 113 112.6  ?BMI 18.4 kg/m2 18.24 kg/m2 18.17 kg/m2  ? ? ?Physical Exam ?Constitutional:   ?   Appearance: He is ill-appearing (frail, O2 in place via [place]).  ?HENT:  ?   Mouth/Throat:  ?   Mouth: Mucous membranes are dry.  ?Cardiovascular:  ?   Rate and Rhythm: Normal rate and regular rhythm.  ?Pulmonary:  ?   Effort: Pulmonary  effort is normal.  ?   Comments: Diminished in all fields ?Skin: ?   General: Skin is warm and dry.  ?   Capillary Refill: Capillary refill takes less than 2 seconds.  ?Neurological:  ?   Mental Status: Mental status is at baseline.  ? ? ? ?  ? ?Lab Results  ?Component Value Date  ? WBC 4.6 12/22/2021  ? HGB 12.5 (L) 12/22/2021  ? HCT 36.3 (L) 12/22/2021  ? PLT 165 12/22/2021  ? GLUCOSE 76 12/22/2021  ? CHOL 143 12/22/2021  ? TRIG 61 12/22/2021  ? HDL 58 12/22/2021  ? [place] 72 12/22/2021  ? ALT 13 12/22/2021  ? AST  17 12/22/2021  ? NA 140 12/22/2021  ? K 4.6 12/22/2021  ? CL 106 12/22/2021  ? CREATININE 1.27 12/22/2021  ? BUN 20 12/22/2021  ? CO2 23 12/22/2021  ? TSH 2.010 12/22/2021  ? ? ? ? ?Assessment & Plan:  ? ? ?1. Community acquired pneumonia, unspecified laterality ?- DG Chest 2 View; Future ?- CBC with Differential/Platelet; Future,unable to obtain labs in-office today due to dehydrated state ?- Comprehensive metabolic panel; Future,unable to obtain labs in-office today due to dehydrated state ?-continue oxygen as prescribed ?-continue breathing exercises as ordered ? ?2. Chronic respiratory failure with hypoxia, on home O2 therapy ([place]) ?- Fluticasone-Umeclidin-Vilant (TRELEGY ELLIPTA) 100-62.5-25 MCG/ACT AEPB; Inhale 1 puff into the lungs daily.  Dispense: 2 each; Refill: 0 ?- Fluticasone-Umeclidin-Vilant (TRELEGY ELLIPTA) 100-62.5-25 MCG/ACT AEPB; Inhale 1 puff into the lungs daily.  Dispense: 1 each; Refill: 11 ?-continue O2 as prescribed ? ?3. Aspiration pneumonia, unspecified aspiration pneumonia type, unspecified laterality, unspecified part of lung ([place]) ?- Ambulatory referral to [facility], pt needs speech therapy to evaluate swallowing ? ?4. COPD exacerbation ([place]) ?- Fluticasone-Umeclidin-Vilant (TRELEGY ELLIPTA) 100-62.5-25 MCG/ACT AEPB; Inhale 1 puff into the lungs daily.  Dispense: 2 each; Refill: 0 ?- Fluticasone-Umeclidin-Vilant (TRELEGY ELLIPTA) 100-62.5-25 MCG/ACT AEPB; Inhale 1 puff  into the lungs daily.  Dispense: 1 each; Refill: 11 ? ?5. Moderate protein-calorie malnutrition ([place]) ?- Ambulatory referral to [facility] ?- CBC with Differential/Platelet; Future ?- Comprehensive metaboli

## 2022-01-04 NOTE — Patient Instructions (Addendum)
Obtain follow-up chest x-ray in 6-weeks ?Continue oxygen as directed ?We will set home health speech pathology  ?Push fluids, add thickener ?Increase food intake (ensure/boost mixed with milkshake) ?Use cane or walker for ambulation ?Obtain handicapped placard  ?Discuss advance directives ?Return in 48 hours for labs after pushing fluids and food intake ?Follow-up in 7 weeks ? ? ? ? ?Aspiration Pneumonia, Adult ? ?Aspiration pneumonia is an infection that occurs after lung (pulmonary) aspiration. Pulmonary aspiration is when you inhale a large amount of food, liquid, stomach acid, or saliva into the lungs. This can cause inflammation and infection in the lungs. This can make you cough and make it hard to breathe. Aspiration pneumonia is a serious condition and can be life-threatening. ?What are the causes? ?This condition may be caused by: ?Bacteria in food, liquid, stomach acid, or saliva that is inhaled into the lung. ?Irritation and inflammation that results from material, such as blood or a foreign body, being inhaled into the lung. This can lead to an infection even though the material is not originally contaminated with bacteria. ?What increases the risk? ?You are more likely to get aspiration pneumonia if you have a condition that makes it hard to breathe, swallow, cough, or gag. These conditions may include: ?A breathing disorder, such as chronic obstructive pulmonary disease, that makes it hard to eat or drink while breathing. ?A brain (neurologic) disorder, such as stroke, seizures, Parkinson's disease, dementia, amyotrophic lateral sclerosis (ALS), or brain injury. ?Having gastroesophageal reflux disease (GERD). ?Having a weak disease-fighting system (immune system). ?Having a narrowing of the tube that carries food to the stomach (esophageal narrowing). ?Other factors that may make you more likely to get aspiration pneumonia include: ?Being older than age 69 and frail. ?Being given a general anesthetic  for procedures. ?Drinking too much alcohol and passing out. If you pass out and vomit, then vomit can be inhaled into your lungs. ?Taking certain medicines, such as tranquilizers or sedatives. ?Taking poor care of your mouth and teeth. ?Being malnourished. ?What are the signs or symptoms? ?The main symptom of pulmonary aspiration may be an episode of choking or coughing while eating or drinking. ?When aspiration pneumonia develops, symptoms include: ?Persistent cough. ?Difficulty breathing, such as wheezing or shortness of breath. ?Fever. ?Chest pain. ?Being more tired than usual (fatigue). ?Pulmonary aspiration may be silent, meaning that it is not associated with coughing or choking while eating or drinking. ?How is this diagnosed? ?This condition may be diagnosed based on: ?A physical exam. ?Tests, such as: ?A chest X-ray. ?A sputum culture. Saliva and mucus (sputum) are collected from the lungs or the tubes that carry air to the lungs (bronchi). The sputum is then tested for bacteria. ?Oximetry. A sensor or clip is placed on areas such as a finger, earlobe, or toe to measure the oxygen level in your blood. ?Blood tests. ?A swallowing study. This test looks at how food is swallowed and whether it goes into your windpipe (trachea) or esophagus. ?A bronchoscopy. This test uses a flexible tube (bronchoscope) to see inside the lungs. ?How is this treated? ?This condition may be treated with: ?Medicines. Antibiotic medicine will be given to kill the pneumonia bacteria. Other medicines may also be used to reduce fever, pain, or inflammation. ?Breathing assistance and oxygen therapy. Depending on how well you are breathing, you may need to be given oxygen, or you may need breathing support from a breathing machine (ventilator). ?Thoracentesis. This is a procedure to remove fluid that has built up  in the space between the linings of the chest wall and the lungs. ?Dietary changes. You may need to avoid certain food  textures or liquids. ?For people who have recurrent aspiration pneumonia, a feeding tube might be placed in the stomach for nutrition. ?Follow these instructions at home: ?Medicines ? ?Take over-the-counter and prescription medicines only as told by your health care provider. ?If you were prescribed an antibiotic medicine, take it as told by your health care provider. Do not stop taking the antibiotic even if you start to feel better. ?Take cough medicine only if you are losing sleep. Cough medicine can prevent your body's natural ability to remove mucus from your lungs. ?General instructions ? ?Carefully follow any eating instructions you were given, such as avoiding certain food textures or thickening your liquids. Thickening liquids reduces the risk of developing aspiration pneumonia again. ?Return to normal activities as told by your health care provider. Ask your health care provider what activities are safe for you. ?Sleep in a semi-upright position at night. Try to sleep in a reclining chair, or place a few pillows under your head in bed. ?Do not use any products that contain nicotine or tobacco, such as cigarettes, e-cigarettes, and chewing tobacco. If you need help quitting, ask your health care provider. ?Keep all follow-up visits as told by your health care provider. This is important. ?Contact a health care provider if you: ?Have a fever. ?Are coughing or choking while eating or drinking. ?Continue to have signs or symptoms of aspiration pneumonia. ?Get help right away if you have: ?Worsening shortness of breath, wheezing, or difficulty breathing. ?Chest pain. ?Summary ?Aspiration pneumonia is an infection that occurs after lung (pulmonary) aspiration. Pulmonary aspiration is when you inhale a large amount of food, liquid, stomach acid, or saliva into the lungs. ?The main symptom of pulmonary aspiration may be an episode of choking or coughing. It may also be silent without coughing or choking. ?You are  more likely to get aspiration pneumonia if you have a condition that makes it hard to breathe, swallow, cough, or gag. ?This information is not intended to replace advice given to you by your health care provider. Make sure you discuss any questions you have with your health care provider. ?Document Revised: 08/31/2019 Document Reviewed: 08/31/2019 ?Elsevier Patient Education ? [employer]. ?Safety In The Home ? ?Use a variety of textures, such as Velcro, rubber bands and raised dots to provide tactile clues.  Apply to the on/off controls on appliances, at the end of the banister, or on medicine bottles. ? ?Flooring  ?The following suggestions can help reduce the risk of a fall: ?Repair or replace torn carpet because a foot, cane or walker can easily get caught. ?Remove area carpets or throw rugs, especially if your loved one has a shuffling gait or uses a walker. ?When rugs  Or carpeting cannot be eliminated, place non-skid padding under rugs or secure to floor with double sided tape.  Area carpets without padding or tape can easily buckle underneath when walked on, causing a person to slip or fall. ?Use only matte, non-shiny finishes on the floor. ?Doorsills can be tripping hazards.  Remove them whenever possible or paint them a contrasting color. ?Eliminate low furniture that is easy to trip over such as coffee tables and footstools. ?Move furniture against walls to create a large area of uncluttered space in the center of the room.  However, if you are rearranging a room for someone else, discuss beforehand with that  person.  Many individuals rely on specific locations of furniture to find their way around a room. ?It is easier to see the sofa or chair when its color contrasts with that of the flooring.  Choose a fabric that contrasts with the floor material or use a bright colored piping along the edges of the seat cushion. ?Reduce glare on polished furniture by covering it with a large doily or  tablecloth. ?

## 2022-01-05 ENCOUNTER — Ambulatory Visit: Payer: Medicare Other | Admitting: Legal Medicine

## 2022-01-05 ENCOUNTER — Telehealth: Payer: Self-pay

## 2022-01-05 DIAGNOSIS — E538 Deficiency of other specified B group vitamins: Secondary | ICD-10-CM | POA: Diagnosis not present

## 2022-01-05 DIAGNOSIS — E039 Hypothyroidism, unspecified: Secondary | ICD-10-CM | POA: Diagnosis not present

## 2022-01-05 DIAGNOSIS — E43 Unspecified severe protein-calorie malnutrition: Secondary | ICD-10-CM | POA: Diagnosis not present

## 2022-01-05 DIAGNOSIS — G309 Alzheimer's disease, unspecified: Secondary | ICD-10-CM | POA: Diagnosis not present

## 2022-01-05 DIAGNOSIS — I129 Hypertensive chronic kidney disease with stage 1 through stage 4 chronic kidney disease, or unspecified chronic kidney disease: Secondary | ICD-10-CM | POA: Diagnosis not present

## 2022-01-05 DIAGNOSIS — J9601 Acute respiratory failure with hypoxia: Secondary | ICD-10-CM | POA: Diagnosis not present

## 2022-01-05 DIAGNOSIS — I252 Old myocardial infarction: Secondary | ICD-10-CM | POA: Diagnosis not present

## 2022-01-05 DIAGNOSIS — R1310 Dysphagia, unspecified: Secondary | ICD-10-CM | POA: Diagnosis not present

## 2022-01-05 DIAGNOSIS — J440 Chronic obstructive pulmonary disease with acute lower respiratory infection: Secondary | ICD-10-CM | POA: Diagnosis not present

## 2022-01-05 DIAGNOSIS — Z9981 Dependence on supplemental oxygen: Secondary | ICD-10-CM | POA: Diagnosis not present

## 2022-01-05 DIAGNOSIS — M1990 Unspecified osteoarthritis, unspecified site: Secondary | ICD-10-CM | POA: Diagnosis not present

## 2022-01-05 DIAGNOSIS — N182 Chronic kidney disease, stage 2 (mild): Secondary | ICD-10-CM | POA: Diagnosis not present

## 2022-01-05 DIAGNOSIS — Z951 Presence of aortocoronary bypass graft: Secondary | ICD-10-CM | POA: Diagnosis not present

## 2022-01-05 DIAGNOSIS — E7800 Pure hypercholesterolemia, unspecified: Secondary | ICD-10-CM | POA: Diagnosis not present

## 2022-01-05 DIAGNOSIS — A419 Sepsis, unspecified organism: Secondary | ICD-10-CM | POA: Diagnosis not present

## 2022-01-05 DIAGNOSIS — J441 Chronic obstructive pulmonary disease with (acute) exacerbation: Secondary | ICD-10-CM | POA: Diagnosis not present

## 2022-01-05 DIAGNOSIS — M6281 Muscle weakness (generalized): Secondary | ICD-10-CM | POA: Diagnosis not present

## 2022-01-05 NOTE — Telephone Encounter (Signed)
Home Health nurse will drawn CMP and CBC and fax the results this week per [facility]rell [place] order. ?

## 2022-01-07 DIAGNOSIS — E43 Unspecified severe protein-calorie malnutrition: Secondary | ICD-10-CM | POA: Diagnosis not present

## 2022-01-07 DIAGNOSIS — M1990 Unspecified osteoarthritis, unspecified site: Secondary | ICD-10-CM | POA: Diagnosis not present

## 2022-01-07 DIAGNOSIS — Z9981 Dependence on supplemental oxygen: Secondary | ICD-10-CM | POA: Diagnosis not present

## 2022-01-07 DIAGNOSIS — Z951 Presence of aortocoronary bypass graft: Secondary | ICD-10-CM | POA: Diagnosis not present

## 2022-01-07 DIAGNOSIS — I129 Hypertensive chronic kidney disease with stage 1 through stage 4 chronic kidney disease, or unspecified chronic kidney disease: Secondary | ICD-10-CM | POA: Diagnosis not present

## 2022-01-07 DIAGNOSIS — R1310 Dysphagia, unspecified: Secondary | ICD-10-CM | POA: Diagnosis not present

## 2022-01-07 DIAGNOSIS — G309 Alzheimer's disease, unspecified: Secondary | ICD-10-CM | POA: Diagnosis not present

## 2022-01-07 DIAGNOSIS — M6281 Muscle weakness (generalized): Secondary | ICD-10-CM | POA: Diagnosis not present

## 2022-01-07 DIAGNOSIS — N182 Chronic kidney disease, stage 2 (mild): Secondary | ICD-10-CM | POA: Diagnosis not present

## 2022-01-07 DIAGNOSIS — A419 Sepsis, unspecified organism: Secondary | ICD-10-CM | POA: Diagnosis not present

## 2022-01-07 DIAGNOSIS — E039 Hypothyroidism, unspecified: Secondary | ICD-10-CM | POA: Diagnosis not present

## 2022-01-07 DIAGNOSIS — J440 Chronic obstructive pulmonary disease with acute lower respiratory infection: Secondary | ICD-10-CM | POA: Diagnosis not present

## 2022-01-07 DIAGNOSIS — E7800 Pure hypercholesterolemia, unspecified: Secondary | ICD-10-CM | POA: Diagnosis not present

## 2022-01-07 DIAGNOSIS — I252 Old myocardial infarction: Secondary | ICD-10-CM | POA: Diagnosis not present

## 2022-01-07 DIAGNOSIS — E538 Deficiency of other specified B group vitamins: Secondary | ICD-10-CM | POA: Diagnosis not present

## 2022-01-07 DIAGNOSIS — J9601 Acute respiratory failure with hypoxia: Secondary | ICD-10-CM | POA: Diagnosis not present

## 2022-01-07 DIAGNOSIS — J441 Chronic obstructive pulmonary disease with (acute) exacerbation: Secondary | ICD-10-CM | POA: Diagnosis not present

## 2022-01-08 DIAGNOSIS — E43 Unspecified severe protein-calorie malnutrition: Secondary | ICD-10-CM | POA: Diagnosis not present

## 2022-01-08 DIAGNOSIS — N182 Chronic kidney disease, stage 2 (mild): Secondary | ICD-10-CM | POA: Diagnosis not present

## 2022-01-08 DIAGNOSIS — I252 Old myocardial infarction: Secondary | ICD-10-CM | POA: Diagnosis not present

## 2022-01-08 DIAGNOSIS — I129 Hypertensive chronic kidney disease with stage 1 through stage 4 chronic kidney disease, or unspecified chronic kidney disease: Secondary | ICD-10-CM | POA: Diagnosis not present

## 2022-01-08 DIAGNOSIS — M6281 Muscle weakness (generalized): Secondary | ICD-10-CM | POA: Diagnosis not present

## 2022-01-08 DIAGNOSIS — R1310 Dysphagia, unspecified: Secondary | ICD-10-CM | POA: Diagnosis not present

## 2022-01-08 DIAGNOSIS — E538 Deficiency of other specified B group vitamins: Secondary | ICD-10-CM | POA: Diagnosis not present

## 2022-01-08 DIAGNOSIS — J9601 Acute respiratory failure with hypoxia: Secondary | ICD-10-CM | POA: Diagnosis not present

## 2022-01-08 DIAGNOSIS — M1990 Unspecified osteoarthritis, unspecified site: Secondary | ICD-10-CM | POA: Diagnosis not present

## 2022-01-08 DIAGNOSIS — Z9981 Dependence on supplemental oxygen: Secondary | ICD-10-CM | POA: Diagnosis not present

## 2022-01-08 DIAGNOSIS — Z951 Presence of aortocoronary bypass graft: Secondary | ICD-10-CM | POA: Diagnosis not present

## 2022-01-08 DIAGNOSIS — E860 Dehydration: Secondary | ICD-10-CM | POA: Diagnosis not present

## 2022-01-08 DIAGNOSIS — Z5181 Encounter for therapeutic drug level monitoring: Secondary | ICD-10-CM | POA: Diagnosis not present

## 2022-01-08 DIAGNOSIS — E7800 Pure hypercholesterolemia, unspecified: Secondary | ICD-10-CM | POA: Diagnosis not present

## 2022-01-08 DIAGNOSIS — J440 Chronic obstructive pulmonary disease with acute lower respiratory infection: Secondary | ICD-10-CM | POA: Diagnosis not present

## 2022-01-08 DIAGNOSIS — J441 Chronic obstructive pulmonary disease with (acute) exacerbation: Secondary | ICD-10-CM | POA: Diagnosis not present

## 2022-01-08 DIAGNOSIS — A419 Sepsis, unspecified organism: Secondary | ICD-10-CM | POA: Diagnosis not present

## 2022-01-08 DIAGNOSIS — G309 Alzheimer's disease, unspecified: Secondary | ICD-10-CM | POA: Diagnosis not present

## 2022-01-08 DIAGNOSIS — E039 Hypothyroidism, unspecified: Secondary | ICD-10-CM | POA: Diagnosis not present

## 2022-01-11 ENCOUNTER — Other Ambulatory Visit: Payer: Self-pay

## 2022-01-11 DIAGNOSIS — J189 Pneumonia, unspecified organism: Secondary | ICD-10-CM

## 2022-01-12 ENCOUNTER — Other Ambulatory Visit: Payer: Self-pay

## 2022-01-13 DIAGNOSIS — Z951 Presence of aortocoronary bypass graft: Secondary | ICD-10-CM | POA: Diagnosis not present

## 2022-01-13 DIAGNOSIS — M6281 Muscle weakness (generalized): Secondary | ICD-10-CM | POA: Diagnosis not present

## 2022-01-13 DIAGNOSIS — R1310 Dysphagia, unspecified: Secondary | ICD-10-CM | POA: Diagnosis not present

## 2022-01-13 DIAGNOSIS — E538 Deficiency of other specified B group vitamins: Secondary | ICD-10-CM | POA: Diagnosis not present

## 2022-01-13 DIAGNOSIS — J440 Chronic obstructive pulmonary disease with acute lower respiratory infection: Secondary | ICD-10-CM | POA: Diagnosis not present

## 2022-01-13 DIAGNOSIS — M1990 Unspecified osteoarthritis, unspecified site: Secondary | ICD-10-CM | POA: Diagnosis not present

## 2022-01-13 DIAGNOSIS — I252 Old myocardial infarction: Secondary | ICD-10-CM | POA: Diagnosis not present

## 2022-01-13 DIAGNOSIS — N182 Chronic kidney disease, stage 2 (mild): Secondary | ICD-10-CM | POA: Diagnosis not present

## 2022-01-13 DIAGNOSIS — E039 Hypothyroidism, unspecified: Secondary | ICD-10-CM | POA: Diagnosis not present

## 2022-01-13 DIAGNOSIS — E7800 Pure hypercholesterolemia, unspecified: Secondary | ICD-10-CM | POA: Diagnosis not present

## 2022-01-13 DIAGNOSIS — J9601 Acute respiratory failure with hypoxia: Secondary | ICD-10-CM | POA: Diagnosis not present

## 2022-01-13 DIAGNOSIS — J441 Chronic obstructive pulmonary disease with (acute) exacerbation: Secondary | ICD-10-CM | POA: Diagnosis not present

## 2022-01-13 DIAGNOSIS — G309 Alzheimer's disease, unspecified: Secondary | ICD-10-CM | POA: Diagnosis not present

## 2022-01-13 DIAGNOSIS — Z9981 Dependence on supplemental oxygen: Secondary | ICD-10-CM | POA: Diagnosis not present

## 2022-01-13 DIAGNOSIS — A419 Sepsis, unspecified organism: Secondary | ICD-10-CM | POA: Diagnosis not present

## 2022-01-13 DIAGNOSIS — E43 Unspecified severe protein-calorie malnutrition: Secondary | ICD-10-CM | POA: Diagnosis not present

## 2022-01-13 DIAGNOSIS — I129 Hypertensive chronic kidney disease with stage 1 through stage 4 chronic kidney disease, or unspecified chronic kidney disease: Secondary | ICD-10-CM | POA: Diagnosis not present

## 2022-01-14 ENCOUNTER — Encounter: Payer: Self-pay | Admitting: Nurse Practitioner

## 2022-01-14 DIAGNOSIS — Z8701 Personal history of pneumonia (recurrent): Secondary | ICD-10-CM

## 2022-01-14 DIAGNOSIS — R1312 Dysphagia, oropharyngeal phase: Secondary | ICD-10-CM

## 2022-01-15 MED ORDER — THICK & EASY PO PACK: 1.0000 | PACK | Freq: Every day | ORAL | 1 refills | Status: DC

## 2022-01-20 DIAGNOSIS — E039 Hypothyroidism, unspecified: Secondary | ICD-10-CM | POA: Diagnosis not present

## 2022-01-20 DIAGNOSIS — Z9981 Dependence on supplemental oxygen: Secondary | ICD-10-CM | POA: Diagnosis not present

## 2022-01-20 DIAGNOSIS — I252 Old myocardial infarction: Secondary | ICD-10-CM | POA: Diagnosis not present

## 2022-01-20 DIAGNOSIS — A419 Sepsis, unspecified organism: Secondary | ICD-10-CM | POA: Diagnosis not present

## 2022-01-20 DIAGNOSIS — J440 Chronic obstructive pulmonary disease with acute lower respiratory infection: Secondary | ICD-10-CM | POA: Diagnosis not present

## 2022-01-20 DIAGNOSIS — R1310 Dysphagia, unspecified: Secondary | ICD-10-CM | POA: Diagnosis not present

## 2022-01-20 DIAGNOSIS — E538 Deficiency of other specified B group vitamins: Secondary | ICD-10-CM | POA: Diagnosis not present

## 2022-01-20 DIAGNOSIS — E43 Unspecified severe protein-calorie malnutrition: Secondary | ICD-10-CM | POA: Diagnosis not present

## 2022-01-20 DIAGNOSIS — N182 Chronic kidney disease, stage 2 (mild): Secondary | ICD-10-CM | POA: Diagnosis not present

## 2022-01-20 DIAGNOSIS — I129 Hypertensive chronic kidney disease with stage 1 through stage 4 chronic kidney disease, or unspecified chronic kidney disease: Secondary | ICD-10-CM | POA: Diagnosis not present

## 2022-01-20 DIAGNOSIS — Z951 Presence of aortocoronary bypass graft: Secondary | ICD-10-CM | POA: Diagnosis not present

## 2022-01-20 DIAGNOSIS — E7800 Pure hypercholesterolemia, unspecified: Secondary | ICD-10-CM | POA: Diagnosis not present

## 2022-01-20 DIAGNOSIS — M6281 Muscle weakness (generalized): Secondary | ICD-10-CM | POA: Diagnosis not present

## 2022-01-20 DIAGNOSIS — G309 Alzheimer's disease, unspecified: Secondary | ICD-10-CM | POA: Diagnosis not present

## 2022-01-20 DIAGNOSIS — J9601 Acute respiratory failure with hypoxia: Secondary | ICD-10-CM | POA: Diagnosis not present

## 2022-01-20 DIAGNOSIS — J441 Chronic obstructive pulmonary disease with (acute) exacerbation: Secondary | ICD-10-CM | POA: Diagnosis not present

## 2022-01-20 DIAGNOSIS — M1990 Unspecified osteoarthritis, unspecified site: Secondary | ICD-10-CM | POA: Diagnosis not present

## 2022-02-22 ENCOUNTER — Encounter: Payer: Self-pay | Admitting: Legal Medicine

## 2022-02-24 ENCOUNTER — Ambulatory Visit (INDEPENDENT_AMBULATORY_CARE_PROVIDER_SITE_OTHER): Payer: Medicare Other | Admitting: Legal Medicine

## 2022-02-24 ENCOUNTER — Encounter: Payer: Self-pay | Admitting: Legal Medicine

## 2022-02-24 VITALS — BP 84/50 | HR 84 | Temp 98.2°F | Resp 15 | Ht 66.0 in | Wt 107.0 lb

## 2022-02-24 DIAGNOSIS — A6923 Arthritis due to Lyme disease: Secondary | ICD-10-CM | POA: Diagnosis not present

## 2022-02-24 DIAGNOSIS — R918 Other nonspecific abnormal finding of lung field: Secondary | ICD-10-CM

## 2022-02-24 DIAGNOSIS — A770 Spotted fever due to Rickettsia rickettsii: Secondary | ICD-10-CM | POA: Diagnosis not present

## 2022-02-24 DIAGNOSIS — R634 Abnormal weight loss: Secondary | ICD-10-CM | POA: Diagnosis not present

## 2022-02-24 DIAGNOSIS — J439 Emphysema, unspecified: Secondary | ICD-10-CM | POA: Diagnosis not present

## 2022-02-24 MED ORDER — DOXYCYCLINE HYCLATE 100 MG PO TABS: 100.0000 mg | ORAL_TABLET | Freq: Two times a day (BID) | ORAL | 1 refills | Status: DC

## 2022-02-24 NOTE — Progress Notes (Signed)
Subjective:  Patient ID: Lucas Kline, male    DOB: 02-11-1929  Age: 88 y.o. MRN: [id number]  Chief Complaint  Patient presents with   Anorexia   Tick Removal    HPI  Patient is here for loss appetite since 2 weeks ago and he feels constipated. He also had some ticks on his back and one more still there.  He has lost 5lbs and still has right sided effusion with reticulonodular densities at [facility] today.  Need CT scan chest  Procedure:  one tick brown removed for back by direct vision, total tick removed, area disinfected. Current Outpatient Medications on File Prior to Visit  Medication Sig Dispense Refill   albuterol (ACCUNEB) 1.25 MG/3ML nebulizer solution Take by nebulization every 6 (six) hours as needed.     cyanocobalamin (,VITAMIN B-12,) 1000 MCG/ML injection INJECT 1 ML (1,000 MCG TOTAL) INTO THE MUSCLE EVERY 30 DAYS. 3 mL 6   Fluticasone-Umeclidin-Vilant (TRELEGY ELLIPTA) 100-62.5-25 MCG/ACT AEPB Inhale 1 puff into the lungs daily. 1 each 11   levothyroxine (SYNTHROID) 50 MCG tablet TAKE 1 TABLET BY MOUTH EVERY DAY 90 tablet 2   Magnesium Hydroxide (DULCOLAX PO) Take 1 tablet by mouth daily as needed.     memantine (NAMENDA) 5 MG tablet TAKE 1 TABLET BY MOUTH TWICE A DAY 180 tablet 2   simvastatin (ZOCOR) 20 MG tablet TAKE 1 TABLET BY MOUTH EVERY DAY 90 tablet 3   SPIRIVA RESPIMAT 2.5 MCG/ACT AERS Take 1 puff by mouth 2 (two) times daily.     verapamil (CALAN-SR) 240 MG CR tablet TAKE 1 TABLET BY MOUTH EVERY DAY 90 tablet 2   No current facility-administered medications on file prior to visit.   Past Medical History:  Diagnosis Date   Abnormal EKG 01/28/2020   Adult BMI <19 kg/sq m 01/21/2020   Alzheimer disease ([place])    Atherosclerotic heart disease of native coronary artery without angina pectoris    CHRONIC    B12 deficiency    CHRONIC   Bilateral carotid bruits 01/28/2020   Chronic atrial fibrillation ([place]) 01/21/2020   CKD (chronic kidney disease),  stage II    MILD   CKD (chronic kidney disease), stage III (HCC)    MODERATE (CHRONIC)   Coronary atherosclerosis    CHRONIC   Essential hypertension 01/28/2020   Essential hypertension, benign    Essential tremor    CHRONIC   Malnutrition of moderate degree ([place]) 01/21/2020   Memory deficit 09/08/2020   Mixed hearing loss, bilateral    CHRONIC   Mixed hyperlipidemia    CHRONIC    Obstructive chronic bronchitis without exacerbation ([place]) 01/21/2020   Osteoarthritis    OF KNEE CHRONIC   Personal history of malignant neoplasm of larynx 01/18/2020   Primary hypothyroidism 01/22/2020   Routine general medical examination at a health care facility 09/08/2020   Unspecified arthropathy, lower leg    Unspecified constipation    CHRONIC   Unstable gait 01/21/2020   Past Surgical History:  Procedure Laterality Date   CATARACT EXTRACTION, BILATERAL      Family History  Problem Relation Age of Onset   Hypertension Neg Hx    Heart disease Neg Hx    Cancer Neg Hx    Diabetes Neg Hx    Social History   Socioeconomic History   Marital status: Widowed    Spouse name: Not on file   Number of children: 2   Years of education: Not on file   Highest  education level: Not on file  Occupational History   Occupation: retired   Occupation: Copywriter, advertising  Tobacco Use   Smoking status: Former    Types: Cigarettes    Quit date: 2008    Years since quitting: 15.4   Smokeless tobacco: Current    Types: Chew   Tobacco comments:    1 pouch sometimes  Vaping Use   Vaping Use: Never used  Substance and Sexual Activity   Alcohol use: No   Drug use: No   Sexual activity: Yes  Other Topics Concern   Not on file  Social History Narrative   Not on file   Social Determinants of Health   Financial Resource Strain: Low Risk  (12/01/2021)   Overall Financial Resource Strain (CARDIA)    Difficulty of Paying Living Expenses: Not hard at all  Food Insecurity: No Food Insecurity (09/08/2020)    Hunger Vital Sign    Worried About Running Out of Food in the Last Year: Never true    Ran Out of Food in the Last Year: Never true  Transportation Needs: No Transportation Needs (12/01/2021)   PRAPARE - Hydrologist (Medical): No    Lack of Transportation (Non-Medical): No  Physical Activity: Insufficiently Active (09/08/2020)   Exercise Vital Sign    Days of Exercise per Week: 2 days    Minutes of Exercise per Session: 60 min  Stress: No Stress Concern Present (09/08/2020)   [place]    Feeling of Stress : Not at all  Social Connections: Moderately Isolated (09/08/2020)   Social Connection and Isolation Panel [NHANES]    Frequency of Communication with Friends and Family: Once a week    Frequency of Social Gatherings with Friends and Family: More than three times a week    Attends Religious Services: Never    Marine scientist or Organizations: No    Attends Music therapist: More than 4 times per year    Marital Status: Widowed    Review of Systems  Constitutional:  Negative for chills, fatigue, fever and unexpected weight change.  HENT:  Negative for congestion, ear pain, sinus pain and sore throat.   Respiratory:  Positive for cough and shortness of breath.   Cardiovascular:  Negative for chest pain and palpitations.  Gastrointestinal:  Negative for abdominal pain, blood in stool, constipation, diarrhea, nausea and vomiting.  Endocrine: Negative for polydipsia.  Genitourinary:  Negative for dysuria.  Musculoskeletal:  Negative for back pain.  Skin:  Negative for rash.       Tick on the back  Neurological:  Negative for headaches.     Objective:  BP (!) 84/50   Pulse 84   Temp 98.2 F (36.8 C)   Resp 15   Ht '5\' 6"'$  (1.676 m)   Wt 107 lb (48.5 kg)   SpO2 95%   BMI 17.27 kg/m      02/24/2022    1:34 PM 01/04/2022    2:10 PM 12/22/2021    9:12 AM   BP/Weight  Systolic BP 84 509 326  Diastolic BP 50 62 76  Wt. (Lbs) 107 112 114  BMI 17.27 kg/m2 18.08 kg/m2 18.4 kg/m2    Physical Exam Vitals reviewed.  Constitutional:      General: He is in acute distress.     Appearance: Normal appearance.  HENT:     Head: Normocephalic.     Right Ear:  Tympanic membrane normal.     Left Ear: Tympanic membrane normal.     Mouth/Throat:     Mouth: Mucous membranes are moist.     Pharynx: Oropharynx is clear.  Eyes:     Extraocular Movements: Extraocular movements intact.     Conjunctiva/sclera: Conjunctivae normal.     Pupils: Pupils are equal, round, and reactive to light.     Comments: Pale conjunctiva  Cardiovascular:     Rate and Rhythm: Normal rate and regular rhythm.     Pulses: Normal pulses.     Heart sounds: Normal heart sounds. No murmur heard.    No gallop.  Pulmonary:     Effort: Pulmonary effort is normal. No respiratory distress.     Breath sounds: Normal breath sounds. No wheezing.  Abdominal:     General: Abdomen is flat. Bowel sounds are normal. There is no distension.     Tenderness: There is no abdominal tenderness.  Musculoskeletal:     Cervical back: Normal range of motion and neck supple.     Right lower leg: No edema.     Left lower leg: No edema.  Skin:    General: Skin is warm.     Capillary Refill: Capillary refill takes less than 2 seconds.  Neurological:     General: No focal deficit present.     Mental Status: He is alert and oriented to person, place, and time. Mental status is at baseline.         Lab Results  Component Value Date   WBC 6.2 02/24/2022   HGB 11.2 (L) 02/24/2022   HCT 30.9 (L) 02/24/2022   PLT 211 02/24/2022   GLUCOSE 100 (H) 02/24/2022   CHOL 143 12/22/2021   TRIG 61 12/22/2021   HDL 58 12/22/2021   LDLCALC 72 12/22/2021   ALT 19 02/24/2022   AST 26 02/24/2022   NA 139 02/24/2022   K 3.9 02/24/2022   CL 104 02/24/2022   CREATININE 1.22 02/24/2022   BUN 18  02/24/2022   CO2 21 02/24/2022   TSH 3.680 02/24/2022      Assessment & Plan:   Problem List Items Addressed This Visit   None Visit Diagnoses     RMSF [facility] spotted fever)    -  Primary   Relevant Orders   Rocky mtn spotted fvr ab, IgM-blood (Completed) Patient has had tick bites and feeling poorly, test for RMSF   Weight loss       Relevant Orders   CBC with Differential/Platelet (Completed)   Comprehensive metabolic panel (Completed)   TSH (Completed) Patient continues to lose weight, daughter with him, He eats poorly, we discussed    Lyme arthritis ([place])       Relevant Medications   doxycycline (VIBRA-TABS) 100 MG tablet   Other Relevant Orders   Lyme Disease, Western Blot (Completed) Family concerned about lyme disease, we will test, treat empirically with doxycycline   Abnormal findings on diagnostic imaging of lung       Relevant Orders   CT Chest W Contrast Set up CT scan for lung diagnosis., check hemoglobin and hematocrit, may need transfusion     .  Meds ordered this encounter  Medications   doxycycline (VIBRA-TABS) 100 MG tablet    Sig: Take 1 tablet (100 mg total) by mouth 2 (two) times daily.    Dispense:  20 tablet    Refill:  1    Orders Placed This Encounter  Procedures  CT Chest W Contrast   CBC with Differential/Platelet   Comprehensive metabolic panel   Rocky mtn spotted fvr ab, IgM-blood   TSH   Lyme Disease, Western Blot     Follow-up: Return in about 2 weeks (around 03/10/2022).  An After Visit Summary was printed and given to the patient.  Reinaldo Meeker, MD [facility] [phone]

## 2022-02-25 LAB — TSH: TSH: 3.68 u[IU]/mL (ref 0.450–4.500)

## 2022-02-26 LAB — CBC WITH DIFFERENTIAL/PLATELET
Basophils Absolute: 0 10*3/uL (ref 0.0–0.2)
Basos: 1 %
EOS (ABSOLUTE): 0.1 10*3/uL (ref 0.0–0.4)
Eos: 2 %
Hematocrit: 30.9 % — ABNORMAL LOW (ref 37.5–51.0)
Hemoglobin: 11.2 g/dL — ABNORMAL LOW (ref 13.0–17.7)
Immature Grans (Abs): 0 10*3/uL (ref 0.0–0.1)
Immature Granulocytes: 0 %
Lymphocytes Absolute: 1.5 10*3/uL (ref 0.7–3.1)
Lymphs: 24 %
MCH: 33.5 pg — ABNORMAL HIGH (ref 26.6–33.0)
MCHC: 36.2 g/dL — ABNORMAL HIGH (ref 31.5–35.7)
MCV: 93 fL (ref 79–97)
Monocytes Absolute: 0.6 10*3/uL (ref 0.1–0.9)
Monocytes: 10 %
Neutrophils Absolute: 3.9 10*3/uL (ref 1.4–7.0)
Neutrophils: 63 %
Platelets: 211 10*3/uL (ref 150–450)
RBC: 3.34 x10E6/uL — ABNORMAL LOW (ref 4.14–5.80)
RDW: 12.4 % (ref 11.6–15.4)
WBC: 6.2 10*3/uL (ref 3.4–10.8)

## 2022-02-26 LAB — COMPREHENSIVE METABOLIC PANEL
ALT: 19 IU/L (ref 0–44)
AST: 26 IU/L (ref 0–40)
Albumin/Globulin Ratio: 1.2 (ref 1.2–2.2)
Albumin: 3.6 g/dL (ref 3.5–4.6)
Alkaline Phosphatase: 68 IU/L (ref 44–121)
BUN/Creatinine Ratio: 15 (ref 10–24)
BUN: 18 mg/dL (ref 10–36)
Bilirubin Total: 0.5 mg/dL (ref 0.0–1.2)
CO2: 21 mmol/L (ref 20–29)
Calcium: 8.4 mg/dL — ABNORMAL LOW (ref 8.6–10.2)
Chloride: 104 mmol/L (ref 96–106)
Creatinine, Ser: 1.22 mg/dL (ref 0.76–1.27)
Globulin, Total: 2.9 g/dL (ref 1.5–4.5)
Glucose: 100 mg/dL — ABNORMAL HIGH (ref 70–99)
Potassium: 3.9 mmol/L (ref 3.5–5.2)
Sodium: 139 mmol/L (ref 134–144)
Total Protein: 6.5 g/dL (ref 6.0–8.5)
eGFR: 56 mL/min/{1.73_m2} — ABNORMAL LOW (ref >59)

## 2022-02-26 LAB — LYME DISEASE, WESTERN BLOT
IgG P18 Ab.: ABSENT
IgG P23 Ab.: ABSENT
IgG P28 Ab.: ABSENT
IgG P30 Ab.: ABSENT
IgG P39 Ab.: ABSENT
IgG P41 Ab.: ABSENT
IgG P45 Ab.: ABSENT
IgG P58 Ab.: ABSENT
IgG P66 Ab.: ABSENT
IgG P93 Ab.: ABSENT
IgM P23 Ab.: ABSENT
IgM P39 Ab.: ABSENT
IgM P41 Ab.: ABSENT
Lyme IgG Wb: NEGATIVE
Lyme IgM Wb: NEGATIVE

## 2022-02-26 LAB — ROCKY MTN SPOTTED FVR AB, IGM-BLOOD: RMSF IgM: 0.35 index (ref 0.00–0.89)

## 2022-02-26 NOTE — Progress Notes (Signed)
Lyme tests all normal, TSH 3.68 normal,  lp

## 2022-02-27 NOTE — Progress Notes (Signed)
Wbc normal, mild chronic anemia, glucose 100, kidney and Liver tests normal, RMSF test negative lp

## 2022-03-09 NOTE — Progress Notes (Addendum)
Subjective:  Patient ID: Lucas Kline, male    DOB: [birth year]  Age: 88 y.o. MRN: [id number]  Chief Complaint  Patient presents with   2 wk f/u    HPI   Patient presents today for 2 week follow up for decreased appetite. He is accompanied by his adult daughter. 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 Current weight is 110 lbs, BMI 17.75. States he eats when he is hungry. States he is sleeping well. He continues to work outdoors, enjoys gardening and yard work. Daughter states pt may have advance directives in place, copy requested for chart. Packet provided for pt and family to discuss. He is prescribed home O2 for chronic hypoxia related to COPD and chronic aspiration. Reports home O2 sats have been 95-97% on RA. Daughter states she has contacted [employer] to pick up oxygen tanks. Reports they declined due to physician order in place for continuous O2.   Current Outpatient Medications on File Prior to Visit  Medication Sig Dispense Refill   albuterol (ACCUNEB) 1.25 MG/3ML nebulizer solution Take by nebulization every 6 (six) hours as needed.     cyanocobalamin (,VITAMIN B-12,) 1000 MCG/ML injection INJECT 1 ML (1,000 MCG TOTAL) INTO THE MUSCLE EVERY 30 DAYS. 3 mL 6   doxycycline (VIBRA-TABS) 100 MG tablet Take 1 tablet (100 mg total) by mouth 2 (two) times daily. 20 tablet 1   Fluticasone-Umeclidin-Vilant (TRELEGY ELLIPTA) 100-62.5-25 MCG/ACT AEPB Inhale 1 puff into the lungs daily. 1 each 11   levothyroxine (SYNTHROID) 50 MCG tablet TAKE 1 TABLET BY MOUTH EVERY DAY 90 tablet 2   Magnesium Hydroxide (DULCOLAX PO) Take 1 tablet by mouth daily as needed.     memantine (NAMENDA) 5 MG tablet TAKE 1 TABLET BY MOUTH TWICE A DAY 180 tablet 2   simvastatin (ZOCOR) 20 MG tablet TAKE 1 TABLET BY MOUTH EVERY DAY 90 tablet 3   SPIRIVA RESPIMAT 2.5 MCG/ACT AERS Take 1 puff by mouth 2 (two) times daily.     verapamil (CALAN-SR) 240 MG CR tablet TAKE 1  TABLET BY MOUTH EVERY DAY 90 tablet 2   No current facility-administered medications on file prior to visit.   Past Medical History:  Diagnosis Date   Abnormal EKG 01/28/2020   Adult BMI <19 kg/sq m 01/21/2020   Alzheimer disease ([place])    Atherosclerotic heart disease of native coronary artery without angina pectoris    CHRONIC    B12 deficiency    CHRONIC   Bilateral carotid bruits 01/28/2020   Chronic atrial fibrillation ([place]) 01/21/2020   CKD (chronic kidney disease), stage II    MILD   CKD (chronic kidney disease), stage III (HCC)    MODERATE (CHRONIC)   Coronary atherosclerosis    CHRONIC   Essential hypertension 01/28/2020   Essential hypertension, benign    Essential tremor    CHRONIC   Malnutrition of moderate degree ([place]) 01/21/2020   Memory deficit 09/08/2020   Mixed hearing loss, bilateral    CHRONIC   Mixed hyperlipidemia    CHRONIC    Obstructive chronic bronchitis without exacerbation ([place]) 01/21/2020   Osteoarthritis    OF KNEE CHRONIC   Personal history of malignant neoplasm of larynx 01/18/2020   Primary hypothyroidism 01/22/2020   Routine general medical examination at a health care facility 09/08/2020   Unspecified arthropathy, lower leg    Unspecified constipation    CHRONIC   Unstable gait 01/21/2020   Past Surgical History:  Procedure Laterality Date   CATARACT EXTRACTION, BILATERAL      Family History  Problem Relation Age of Onset   Hypertension Neg Hx    Heart disease Neg Hx    Cancer Neg Hx    Diabetes Neg Hx    Social History   Socioeconomic History   Marital status: Widowed    Spouse name: Not on file   Number of children: 2   Years of education: Not on file   Highest education level: Not on file  Occupational History   Occupation: retired   Occupation: Copywriter, advertising  Tobacco Use   Smoking status: Former    Types: Cigarettes    Quit date: 2008    Years since quitting: 15.4   Smokeless tobacco: Current    Types: Chew   Tobacco  comments:    1 pouch sometimes  Vaping Use   Vaping Use: Never used  Substance and Sexual Activity   Alcohol use: No   Drug use: No   Sexual activity: Yes  Other Topics Concern   Not on file  Social History Narrative   Not on file   Social Determinants of Health   Financial Resource Strain: Low Risk  (12/01/2021)   Overall Financial Resource Strain (CARDIA)    Difficulty of Paying Living Expenses: Not hard at all  Food Insecurity: No Food Insecurity (09/08/2020)   Hunger Vital Sign    Worried About Running Out of Food in the Last Year: Never true    [place] in the Last Year: Never true  Transportation Needs: No Transportation Needs (12/01/2021)   PRAPARE - Hydrologist (Medical): No    Lack of Transportation (Non-Medical): No  Physical Activity: Insufficiently Active (09/08/2020)   Exercise Vital Sign    Days of Exercise per Week: 2 days    Minutes of Exercise per Session: 60 min  Stress: No Stress Concern Present (09/08/2020)   [place]    Feeling of Stress : Not at all  Social Connections: Moderately Isolated (09/08/2020)   Social Connection and Isolation Panel [NHANES]    Frequency of Communication with Friends and Family: Once a week    Frequency of Social Gatherings with Friends and Family: More than three times a week    Attends Religious Services: Never    Marine scientist or Organizations: No    Attends Music therapist: More than 4 times per year    Marital Status: Widowed    Review of Systems  Constitutional:  Positive for appetite change (decreased) and fatigue. Negative for chills, diaphoresis and fever.  HENT:  Negative for congestion, ear pain and sore throat.   Respiratory:  Negative for cough and shortness of breath.   Cardiovascular:  Negative for chest pain and leg swelling.  Gastrointestinal:  Negative for abdominal pain,  constipation, diarrhea, nausea and vomiting.  Genitourinary:  Negative for dysuria and urgency.  Musculoskeletal:  Negative for arthralgias and myalgias.  Neurological:  Negative for dizziness and headaches.  Psychiatric/Behavioral:  Positive for confusion (impaired memory) and decreased concentration. Negative for dysphoric mood.      Objective:  BP (!) 108/54   Pulse 67   Temp 98.7 F (37.1 C)   Resp 18   Wt 110 lb (49.9 kg)   SpO2 97%   BMI 17.75 kg/m       02/24/2022    1:34 PM 01/04/2022  2:10 PM 12/22/2021    9:12 AM  BP/Weight  Systolic BP 84 258 527  Diastolic BP 50 62 76  Wt. (Lbs) 107 112 114  BMI 17.27 kg/m2 18.08 kg/m2 18.4 kg/m2    Physical Exam Vitals reviewed.  Constitutional:      Appearance: He is ill-appearing (thin, frail).  HENT:     Head:     Comments: No teeth present    Mouth/Throat:     Mouth: Mucous membranes are dry.     Dentition: Does not have dentures. No gum lesions.  Cardiovascular:     Rate and Rhythm: Normal rate and regular rhythm.  Pulmonary:     Effort: Pulmonary effort is normal.     Comments: Diminished breath sounds in bilateral posterior upper and lower lobes Skin:    General: Skin is warm and dry.     Capillary Refill: Capillary refill takes less than 2 seconds.  Neurological:     Mental Status: He is alert. Mental status is at baseline.     Lab Results  Component Value Date   WBC 6.2 02/24/2022   HGB 11.2 (L) 02/24/2022   HCT 30.9 (L) 02/24/2022   PLT 211 02/24/2022   GLUCOSE 100 (H) 02/24/2022   CHOL 143 12/22/2021   TRIG 61 12/22/2021   HDL 58 12/22/2021   LDLCALC 72 12/22/2021   ALT 19 02/24/2022   AST 26 02/24/2022   NA 139 02/24/2022   K 3.9 02/24/2022   CL 104 02/24/2022   CREATININE 1.22 02/24/2022   BUN 18 02/24/2022   CO2 21 02/24/2022   TSH 3.680 02/24/2022      Assessment & c Plan:   1. Malnutrition of moderate degree (HCC) -assist with ADLs as needed -encourage small frequent  meals -encourage fluids   2. Weight loss -encourage small frequent meals -Recommend supplemental shakes such as Ensure  3. Age-related physical debility -assist with ADLs as needed  4. History of aspiration pneumonia -continue to use thickener for foods  5. Impaired vision in both eyes -driver re-evaluation form signed per family, faxed to [place] -recommend pt no longer drive or operate power machinery -20/70 vision with Snellen eye chart in office  6. Adult BMI <19 kg/sq m -small frequent meals -encourage fluids  7. Advance directive discussed with patient -packet provided, discussed with pt and adult daughter     Continue medications Consider chest CT of lungs Follow-up 07/13/22 at 9:20 with Dr Henrene Pastor    Follow-up: 07/13/22 at 9:00  An After Visit Summary was printed and given to the patient.  I, Rip Harbour, NP, have reviewed all documentation for this visit. The documentation on 03/11/22 for the exam, diagnosis, procedures, and orders are all accurate and complete.   Signed, Rip Harbour, NP [place] [phone]

## 2022-03-10 ENCOUNTER — Ambulatory Visit (INDEPENDENT_AMBULATORY_CARE_PROVIDER_SITE_OTHER): Payer: Medicare Other | Admitting: Nurse Practitioner

## 2022-03-10 ENCOUNTER — Encounter: Payer: Self-pay | Admitting: Nurse Practitioner

## 2022-03-10 VITALS — BP 108/54 | HR 67 | Temp 98.7°F | Resp 18 | Wt 110.0 lb

## 2022-03-10 DIAGNOSIS — Z8701 Personal history of pneumonia (recurrent): Secondary | ICD-10-CM

## 2022-03-10 DIAGNOSIS — Z681 Body mass index (BMI) 19 or less, adult: Secondary | ICD-10-CM

## 2022-03-10 DIAGNOSIS — E440 Moderate protein-calorie malnutrition: Secondary | ICD-10-CM

## 2022-03-10 DIAGNOSIS — H543 Unqualified visual loss, both eyes: Secondary | ICD-10-CM

## 2022-03-10 DIAGNOSIS — Z7189 Other specified counseling: Secondary | ICD-10-CM | POA: Diagnosis not present

## 2022-03-10 DIAGNOSIS — R634 Abnormal weight loss: Secondary | ICD-10-CM | POA: Diagnosis not present

## 2022-03-10 DIAGNOSIS — Z8521 Personal history of malignant neoplasm of larynx: Secondary | ICD-10-CM

## 2022-03-10 DIAGNOSIS — R54 Age-related physical debility: Secondary | ICD-10-CM | POA: Diagnosis not present

## 2022-03-10 NOTE — Patient Instructions (Addendum)
Continue medications Consider chest CT of lungs hFollow-up 07/13/22 at 9:20  Aspiration Pneumonia, Adult  Aspiration pneumonia is an infection that occurs after lung (pulmonary) aspiration. Pulmonary aspiration is when you inhale a large amount of food, liquid, stomach acid, or saliva into the lungs. This can cause inflammation and infection in the lungs. This can make you cough and make it hard to breathe. Aspiration pneumonia is a serious condition and can be life-threatening. What are the causes? This condition may be caused by: Bacteria in food, liquid, stomach acid, or saliva that is inhaled into the lung. Irritation and inflammation that results from material, such as blood or a foreign body, being inhaled into the lung. This can lead to an infection even though the material is not originally contaminated with bacteria. What increases the risk? You are more likely to get aspiration pneumonia if you have a condition that makes it hard to breathe, swallow, cough, or gag. These conditions may include: A breathing disorder, such as chronic obstructive pulmonary disease, that makes it hard to eat or drink while breathing. A brain (neurologic) disorder, such as stroke, seizures, Parkinson's disease, dementia, amyotrophic lateral sclerosis (ALS), or brain injury. Having gastroesophageal reflux disease (GERD). Having a weak disease-fighting system (immune system). Having a narrowing of the tube that carries food to the stomach (esophageal narrowing). Other factors that may make you more likely to get aspiration pneumonia include: Being older than age 75 and frail. Being given a general anesthetic for procedures. Drinking too much alcohol and passing out. If you pass out and vomit, then vomit can be inhaled into your lungs. Taking certain medicines, such as tranquilizers or sedatives. Taking poor care of your mouth and teeth. Being malnourished. What are the signs or symptoms? The main symptom  of pulmonary aspiration may be an episode of choking or coughing while eating or drinking. When aspiration pneumonia develops, symptoms include: Persistent cough. Difficulty breathing, such as wheezing or shortness of breath. Fever. Chest pain. Being more tired than usual (fatigue). Pulmonary aspiration may be silent, meaning that it is not associated with coughing or choking while eating or drinking. How is this diagnosed? This condition may be diagnosed based on: A physical exam. Tests, such as: A chest X-ray. A sputum culture. Saliva and mucus (sputum) are collected from the lungs or the tubes that carry air to the lungs (bronchi). The sputum is then tested for bacteria. Oximetry. A sensor or clip is placed on areas such as a finger, earlobe, or toe to measure the oxygen level in your blood. Blood tests. A swallowing study. This test looks at how food is swallowed and whether it goes into your windpipe (trachea) or esophagus. A bronchoscopy. This test uses a flexible tube (bronchoscope) to see inside the lungs. How is this treated? This condition may be treated with: Medicines. Antibiotic medicine will be given to kill the pneumonia bacteria. Other medicines may also be used to reduce fever, pain, or inflammation. Breathing assistance and oxygen therapy. Depending on how well you are breathing, you may need to be given oxygen, or you may need breathing support from a breathing machine (ventilator). Thoracentesis. This is a procedure to remove fluid that has built up in the space between the linings of the chest wall and the lungs. Dietary changes. You may need to avoid certain food textures or liquids. For people who have recurrent aspiration pneumonia, a feeding tube might be placed in the stomach for nutrition. Follow these instructions at home: Medicines  Take over-the-counter and prescription medicines only as told by your health care provider. If you were prescribed an antibiotic  medicine, take it as told by your health care provider. Do not stop taking the antibiotic even if you start to feel better. Take cough medicine only if you are losing sleep. Cough medicine can prevent your body's natural ability to remove mucus from your lungs. General instructions  Carefully follow any eating instructions you were given, such as avoiding certain food textures or thickening your liquids. Thickening liquids reduces the risk of developing aspiration pneumonia again. Return to normal activities as told by your health care provider. Ask your health care provider what activities are safe for you. Sleep in a semi-upright position at night. Try to sleep in a reclining chair, or place a few pillows under your head in bed. Do not use any products that contain nicotine or tobacco, such as cigarettes, e-cigarettes, and chewing tobacco. If you need help quitting, ask your health care provider. Keep all follow-up visits as told by your health care provider. This is important. Contact a health care provider if you: Have a fever. Are coughing or choking while eating or drinking. Continue to have signs or symptoms of aspiration pneumonia. Get help right away if you have: Worsening shortness of breath, wheezing, or difficulty breathing. Chest pain. Summary Aspiration pneumonia is an infection that occurs after lung (pulmonary) aspiration. Pulmonary aspiration is when you inhale a large amount of food, liquid, stomach acid, or saliva into the lungs. The main symptom of pulmonary aspiration may be an episode of choking or coughing. It may also be silent without coughing or choking. You are more likely to get aspiration pneumonia if you have a condition that makes it hard to breathe, swallow, cough, or gag. This information is not intended to replace advice given to you by your health care provider. Make sure you discuss any questions you have with your health care provider. Document Revised:  08/31/2019 Document Reviewed: 08/31/2019 Elsevier Patient Education  Lucas Kline.     Chronic Obstructive Pulmonary Disease  Chronic obstructive pulmonary disease (COPD) is a long-term (chronic) lung problem. When you have COPD, it is hard for air to get in and out of your lungs. Usually the condition gets worse over time, and your lungs will never return to normal. There are things you can do to keep yourself as healthy as possible. What are the causes? Smoking. This is the most common cause. Certain genes passed from parent to child (inherited). What increases the risk? Being exposed to secondhand smoke from cigarettes, pipes, or cigars. Being exposed to chemicals and other irritants, such as fumes and dust in the work environment. Having chronic lung conditions or infections. What are the signs or symptoms? Shortness of breath, especially during physical activity. A long-term cough with a large amount of thick mucus. Sometimes, the cough may not have any mucus (dry cough). Wheezing. Breathing quickly. Skin that looks gray or blue, especially in the fingers, toes, or lips. Feeling tired (fatigue). Weight loss. Chest tightness. Having infections often. Episodes when breathing symptoms become much worse (exacerbations). At the later stages of this disease, you may have swelling in the ankles, feet, or legs. How is this treated? Taking medicines. Quitting smoking, if you smoke. Rehabilitation. This includes steps to make your body work better. It may involve a team of specialists. Doing exercises. Making changes to your diet. Using oxygen. Lung surgery. Lung transplant. Comfort measures (palliative care). Follow these instructions  at home: Medicines Take over-the-counter and prescription medicines only as told by your doctor. Talk to your doctor before taking any cough or allergy medicines. You may need to avoid medicines that cause your lungs to be dry. Lifestyle If  you smoke, stop smoking. Smoking makes the problem worse. Do not smoke or use any products that contain nicotine or tobacco. If you need help quitting, ask your doctor. Avoid being around things that make your breathing worse. This may include smoke, chemicals, and fumes. Stay active, but remember to rest as well. Learn and use tips on how to manage stress and control your breathing. Make sure you get enough sleep. Most adults need at least 7 hours of sleep every night. Eat healthy foods. Eat smaller meals more often. Rest before meals. Controlled breathing Learn and use tips on how to control your breathing as told by your doctor. Try: Breathing in (inhaling) through your nose for 1 second. Then, pucker your lips and breath out (exhale) through your lips for 2 seconds. Putting one hand on your belly (abdomen). Breathe in slowly through your nose for 1 second. Your hand on your belly should move out. Pucker your lips and breathe out slowly through your lips. Your hand on your belly should move in as you breathe out.  Controlled coughing Learn and use controlled coughing to clear mucus from your lungs. Follow these steps: Lean your head a little forward. Breathe in deeply. Try to hold your breath for 3 seconds. Keep your mouth slightly open while coughing 2 times. Spit any mucus out into a tissue. Rest and do the steps again 1 or 2 times as needed. General instructions Make sure you get all the shots (vaccines) that your doctor recommends. Ask your doctor about a flu shot and a pneumonia shot. Use oxygen therapy and pulmonary rehabilitation if told by your doctor. If you need home oxygen therapy, ask your doctor if you should buy a tool to measure your oxygen level (oximeter). Make a COPD action plan with your doctor. This helps you to know what to do if you feel worse than usual. Manage any other conditions you have as told by your doctor. Avoid going outside when it is very hot, cold, or  humid. Avoid people who have a sickness you can catch (contagious). Keep all follow-up visits. Contact a doctor if: You cough up more mucus than usual. There is a change in the color or thickness of the mucus. It is harder to breathe than usual. Your breathing is faster than usual. You have trouble sleeping. You need to use your medicines more often than usual. You have trouble doing your normal activities such as getting dressed or walking around the house. Get help right away if: You have shortness of breath while resting. You have shortness of breath that stops you from: Being able to talk. Doing normal activities. Your chest hurts for longer than 5 minutes. Your skin color is more blue than usual. Your pulse oximeter shows that you have low oxygen for longer than 5 minutes. You have a fever. You feel too tired to breathe normally. These symptoms may represent a serious problem that is an emergency. Do not wait to see if the symptoms will go away. Get medical help right away. Call your local emergency services (911 in the [place] Do not drive yourself to the hospital. Summary Chronic obstructive pulmonary disease (COPD) is a long-term lung problem. The way your lungs work will never return to normal. Usually  the condition gets worse over time. There are things you can do to keep yourself as healthy as possible. Take over-the-counter and prescription medicines only as told by your doctor. If you smoke, stop. Smoking makes the problem worse. This information is not intended to replace advice given to you by your health care provider. Make sure you discuss any questions you have with your health care provider. Document Revised: 07/08/2020 Document Reviewed: 07/08/2020 Elsevier Patient Education  [place].

## 2022-03-13 DIAGNOSIS — H26493 Other secondary cataract, bilateral: Secondary | ICD-10-CM | POA: Diagnosis not present

## 2022-03-13 DIAGNOSIS — H5203 Hypermetropia, bilateral: Secondary | ICD-10-CM | POA: Diagnosis not present

## 2022-03-17 ENCOUNTER — Other Ambulatory Visit: Payer: Self-pay

## 2022-03-23 ENCOUNTER — Telehealth: Payer: Self-pay

## 2022-03-23 NOTE — Progress Notes (Signed)
    Chronic Care Management Pharmacy Assistant   Name: Lucas Kline  MRN: [id number] DOB: 1929-02-26   Reason for Encounter: General Adherence Call    Recent office visits:  03/10/22 Jerrell Belfast NP. Seen for follow up on Nutrition. No med changes.  02/24/22 Reinaldo Meeker MD. Seen for [facility] Spotted fever. Started on Doxycycline Hyclate '100mg'$ .   01/14/22 Jerrell Belfast NP. Patient Message. Ordered STARCH-MALTO DEXTRIN (THICK & EASY) PACK  01/04/22 Jerrell Belfast NP. Seen for hospital follow up. Started on Trelegy Ellipta inhaler. Referral to home health.  12/22/21 Reinaldo Meeker MD. Seen for routine visit. Referral to ENT, Dermatology. No med changes.  Recent consult visits:  None  Hospital visits:  Medication Reconciliation was completed by comparing discharge summary, patient's EMR and Pharmacy list, and upon discussion with patient.  Admitted to the hospital on 12/25/21 due to Dehydration. Discharge date was 12/25/21. Discharged from [facility].    Medications remain the same after Hospital Discharge:??    Medications: Outpatient Encounter Medications as of 03/23/2022  Medication Sig   albuterol (ACCUNEB) 1.25 MG/3ML nebulizer solution Take by nebulization every 6 (six) hours as needed.   cyanocobalamin (,VITAMIN B-12,) 1000 MCG/ML injection INJECT 1 ML (1,000 MCG TOTAL) INTO THE MUSCLE EVERY 30 DAYS.   Fluticasone-Umeclidin-Vilant (TRELEGY ELLIPTA) 100-62.5-25 MCG/ACT AEPB Inhale 1 puff into the lungs daily.   levothyroxine (SYNTHROID) 50 MCG tablet TAKE 1 TABLET BY MOUTH EVERY DAY   Magnesium Hydroxide (DULCOLAX PO) Take 1 tablet by mouth daily as needed.   memantine (NAMENDA) 5 MG tablet TAKE 1 TABLET BY MOUTH TWICE A DAY   simvastatin (ZOCOR) 20 MG tablet TAKE 1 TABLET BY MOUTH EVERY DAY   SPIRIVA RESPIMAT 2.5 MCG/ACT AERS Take 1 puff by mouth 2 (two) times daily.   verapamil (CALAN-SR) 240 MG CR tablet TAKE 1 TABLET BY MOUTH EVERY DAY    No facility-administered encounter medications on file as of 03/23/2022.    Contacted [name] for General Review Call   Chart Review:  Have there been any documented new, changed, or discontinued medications since last visit? Yes  Has there been any documented recent hospitalizations or ED visits since last visit with Clinical Pharmacist? Yes Brief Summary (including medication and/or Diagnosis changes):Was seen for dehydration, no med changes.   Adherence Review:  Does the Clinical Pharmacist Assistant have access to adherence rates? Yes Adherence rates for STAR metric medications (List medication(s)/day supply/ last 2 fill dates). Simvastatin 01/03/22-10/08/21 90ds, Verapamil 12/31/21-10/24/21 90ds Adherence rates for medications indicated for disease state being reviewed (List medication(s)/day supply/ last 2 fill dates). Does the patient have >5 day gap between last estimated fill dates for any of the above medications or other medication gaps? No Reason for medication gaps.None   Disease State Questions:  Able to connect with Patient? No  Unable to reach pt to complete this call   Elray Mcgregor, [place] Pharmacist Assistant  [phone]

## 2022-05-28 ENCOUNTER — Telehealth: Payer: Self-pay

## 2022-05-28 NOTE — Progress Notes (Signed)
    Chronic Care Management Pharmacy Assistant   Name: Lucas Kline  MRN: [id number] DOB: [birth year]   Reason for Encounter: General Adherence Call   Recent office visits:  None  Recent consult visits:  None  Hospital visits:  None  Medications: Outpatient Encounter Medications as of 05/28/2022  Medication Sig   albuterol (ACCUNEB) 1.25 MG/3ML nebulizer solution Take by nebulization every 6 (six) hours as needed.   cyanocobalamin (,VITAMIN B-12,) 1000 MCG/ML injection INJECT 1 ML (1,000 MCG TOTAL) INTO THE MUSCLE EVERY 30 DAYS.   Fluticasone-Umeclidin-Vilant (TRELEGY ELLIPTA) 100-62.5-25 MCG/ACT AEPB Inhale 1 puff into the lungs daily.   levothyroxine (SYNTHROID) 50 MCG tablet TAKE 1 TABLET BY MOUTH EVERY DAY   Magnesium Hydroxide (DULCOLAX PO) Take 1 tablet by mouth daily as needed.   memantine (NAMENDA) 5 MG tablet TAKE 1 TABLET BY MOUTH TWICE A DAY   simvastatin (ZOCOR) 20 MG tablet TAKE 1 TABLET BY MOUTH EVERY DAY   SPIRIVA RESPIMAT 2.5 MCG/ACT AERS Take 1 puff by mouth 2 (two) times daily.   verapamil (CALAN-SR) 240 MG CR tablet TAKE 1 TABLET BY MOUTH EVERY DAY   No facility-administered encounter medications on file as of 05/28/2022.    Contacted Lucas Kline for General Review Call   Chart Review:  Have there been any documented new, changed, or discontinued medications since last visit? No  Has there been any documented recent hospitalizations or ED visits since last visit with Clinical Pharmacist? No Brief Summary (including medication and/or Diagnosis changes):   Adherence Review:  Does the Clinical Pharmacist Assistant have access to adherence rates? Yes Adherence rates for STAR metric medications (List medication(s)/day /supply/ last 2 fill dates). Verapamil 04/07/22-12/31/21 90ds Simvastatin 04/07/22-01/03/22 90ds Adherence rates for medications indicated for disease state being reviewed (List medication(s)/day supply/ last 2 fill dates). Does  the patient have >5 day gap between last estimated fill dates for any of the above medications or other medication gaps? No Reason for medication gaps. None   Disease State Questions:  Able to connect with Patient? No   Lucas Kline, [facility] Catering manager  [phone]

## 2022-06-03 ENCOUNTER — Ambulatory Visit: Payer: Medicare Other | Attending: Cardiology | Admitting: Cardiology

## 2022-06-03 ENCOUNTER — Encounter: Payer: Self-pay | Admitting: Cardiology

## 2022-06-03 VITALS — BP 108/58 | HR 71 | Ht 66.0 in | Wt 112.0 lb

## 2022-06-03 DIAGNOSIS — I10 Essential (primary) hypertension: Secondary | ICD-10-CM | POA: Diagnosis not present

## 2022-06-03 DIAGNOSIS — I4891 Unspecified atrial fibrillation: Secondary | ICD-10-CM

## 2022-06-03 DIAGNOSIS — I350 Nonrheumatic aortic (valve) stenosis: Secondary | ICD-10-CM

## 2022-06-03 DIAGNOSIS — E782 Mixed hyperlipidemia: Secondary | ICD-10-CM | POA: Diagnosis not present

## 2022-06-03 HISTORY — DX: Nonrheumatic aortic (valve) stenosis: I35.0

## 2022-06-03 HISTORY — DX: Unspecified atrial fibrillation: I48.91

## 2022-06-03 MED ORDER — APIXABAN 2.5 MG PO TABS: 2.5000 mg | ORAL_TABLET | Freq: Two times a day (BID) | ORAL | 3 refills | Status: DC

## 2022-06-03 NOTE — Patient Instructions (Signed)
Medication Instructions:  Your physician has recommended you make the following change in your medication:   Start Eliquis 2.5 mg twice daily.  *If you need a refill on your cardiac medications before your next appointment, please call your pharmacy*   Lab Work: Your physician recommends that you have a BMET, CBC and TSH done today in the office.  You need to do a fecal occult test to check for blood in your stool.  If you have labs (blood work) drawn today and your tests are completely normal, you will receive your results only by: [place] (if you have MyChart) OR A paper copy in the mail If you have any lab test that is abnormal or we need to change your treatment, we will call you to review the results.   Testing/Procedures: Your physician has requested that you have an echocardiogram. Echocardiography is a painless test that uses sound waves to create images of your heart. It provides your doctor with information about the size and shape of your heart and how well your heart's chambers and valves are working. This procedure takes approximately one hour. There are no restrictions for this procedure.    Follow-Up: At [facility], you and your health needs are our priority.  As part of our continuing mission to provide you with exceptional heart care, we have created designated Provider Care Teams.  These Care Teams include your primary Cardiologist (physician) and Advanced Practice Providers (APPs -  Physician Assistants and Nurse Practitioners) who all work together to provide you with the care you need, when you need it.  We recommend signing up for the patient portal called "MyChart".  Sign up information is provided on this After Visit Summary.  MyChart is used to connect with patients for Virtual Visits (Telemedicine).  Patients are able to view lab/test results, encounter notes, upcoming appointments, etc.  Non-urgent messages can be sent to your provider as well.   To  learn more about what you can do with MyChart, go to [email or web address].    Your next appointment:   1 month(s)  The format for your next appointment:   In Person  Provider:   Jyl Heinz, MD   Other Instructions Echocardiogram An echocardiogram is a test that uses sound waves (ultrasound) to produce images of the heart. Images from an echocardiogram can provide important information about: Heart size and shape. The size and thickness and movement of your heart's walls. Heart muscle function and strength. Heart valve function or if you have stenosis. Stenosis is when the heart valves are too narrow. If blood is flowing backward through the heart valves (regurgitation). A tumor or infectious growth around the heart valves. Areas of heart muscle that are not working well because of poor blood flow or injury from a heart attack. Aneurysm detection. An aneurysm is a weak or damaged part of an artery wall. The wall bulges out from the normal force of blood pumping through the body. Tell a health care provider about: Any allergies you have. All medicines you are taking, including vitamins, herbs, eye drops, creams, and over-the-counter medicines. Any blood disorders you have. Any surgeries you have had. Any medical conditions you have. Whether you are pregnant or may be pregnant. What are the risks? Generally, this is a safe test. However, problems may occur, including an allergic reaction to dye (contrast) that may be used during the test. What happens before the test? No specific preparation is needed. You may eat and drink normally. What  happens during the test? You will take off your clothes from the waist up and put on a hospital gown. Electrodes or electrocardiogram (ECG)patches may be placed on your chest. The electrodes or patches are then connected to a device that monitors your heart rate and rhythm. You will lie down on a table for an ultrasound exam. A gel will be  applied to your chest to help sound waves pass through your skin. A handheld device, called a transducer, will be pressed against your chest and moved over your heart. The transducer produces sound waves that travel to your heart and bounce back (or "echo" back) to the transducer. These sound waves will be captured in real-time and changed into images of your heart that can be viewed on a video monitor. The images will be recorded on a computer and reviewed by your health care provider. You may be asked to change positions or hold your breath for a short time. This makes it easier to get different views or better views of your heart. In some cases, you may receive contrast through an IV in one of your veins. This can improve the quality of the pictures from your heart. The procedure may vary among health care providers and hospitals.   What can I expect after the test? You may return to your normal, everyday life, including diet, activities, and medicines, unless your health care provider tells you not to do that. Follow these instructions at home: It is up to you to get the results of your test. Ask your health care provider, or the department that is doing the test, when your results will be ready. Keep all follow-up visits. This is important. Summary An echocardiogram is a test that uses sound waves (ultrasound) to produce images of the heart. Images from an echocardiogram can provide important information about the size and shape of your heart, heart muscle function, heart valve function, and other possible heart problems. You do not need to do anything to prepare before this test. You may eat and drink normally. After the echocardiogram is completed, you may return to your normal, everyday life, unless your health care provider tells you not to do that. This information is not intended to replace advice given to you by your health care provider. Make sure you discuss any questions you have with  your health care provider. Document Revised: 04/22/2020 Document Reviewed: 04/22/2020 Elsevier Patient Education  2021 [employer].

## 2022-06-03 NOTE — Progress Notes (Signed)
Cardiology Office Note:    Date:  06/03/2022   ID:  Lucas Kline, DOB 01/17/1929, MRN [id number]  PCP:  Rip Harbour, NP  Cardiologist:  Jenean Lindau, MD   Referring MD: Rip Harbour, NP    ASSESSMENT:    1. Mixed hyperlipidemia   2. Essential hypertension, benign   3. Moderate aortic stenosis   4. Atrial fibrillation, unspecified type ([place])    PLAN:    In order of problems listed above:  Atherosclerotic vascular disease: Mild carotid atherosclerosis: Secondary prevention stressed with the patient.  Importance of compliance with diet and medication stressed any vocalized understanding. Essential hypertension: Blood pressure stable and diet was emphasized. Mild to moderate aortic stenosis: We will do a follow-up echocardiogram to understand this.  He denies any chest pain syncope or shortness of breath.  I discussed this with him and educated him about it. Newly diagnosed atrial fibrillation:I discussed with the patient atrial fibrillation, disease process. Management and therapy including rate and rhythm control, anticoagulation benefits and potential risks were discussed extensively with the patient. Patient had multiple questions which were answered to patient's satisfaction.  I will do baseline blood work including CBC and TSH and will start him on anticoagulation.  He will be seen in follow-up appointment in a month or earlier if he has any concerns.  He will do I fob in a week or so.  He denies any history of bleeding or any dark stools or any such issues. Patient will be seen in follow-up appointment in 4 weeks or earlier if the patient has any concerns   Medication Adjustments/Labs and Tests Ordered: Current medicines are reviewed at length with the patient today.  Concerns regarding medicines are outlined above.  No orders of the defined types were placed in this encounter.  No orders of the defined types were placed in this encounter.    Chief  Complaint  Patient presents with   Follow-up     History of Present Illness:    Lucas Kline is a 88 y.o. male.  Patient has past medical history of essential hypertension, mixed hyperlipidemia and moderate aortic stenosis.  He denies any problems at this time and takes care of activities of daily living.  No chest pain orthopnea or PND.  Interestingly his record mentions atrial fibrillation but have no documentation of this.  He is a strong guy.  He tells me that he walks in about 4 acres without any symptoms.  At the time of my evaluation, the patient is alert awake oriented and in no distress.  Past Medical History:  Diagnosis Date   Abnormal EKG 01/28/2020   Adult BMI <19 kg/sq m 01/21/2020   Alzheimer disease ([place])    Atherosclerotic heart disease of native coronary artery without angina pectoris    CHRONIC    B12 deficiency    CHRONIC   Bilateral carotid bruits 01/28/2020   Chronic atrial fibrillation ([place]) 01/21/2020   CKD (chronic kidney disease), stage II    MILD   CKD (chronic kidney disease), stage III (HCC)    MODERATE (CHRONIC)   Coronary atherosclerosis    CHRONIC   Essential hypertension 01/28/2020   Essential hypertension, benign    Essential tremor    CHRONIC   Malnutrition of moderate degree ([place]) 01/21/2020   Memory deficit 09/08/2020   Mixed hearing loss, bilateral    CHRONIC   Mixed hyperlipidemia    CHRONIC    Obstructive chronic bronchitis without exacerbation ([place])  01/21/2020   Osteoarthritis    OF KNEE CHRONIC   Primary hypothyroidism 01/22/2020   Routine general medical examination at a health care facility 09/08/2020   Unspecified arthropathy, lower leg    Unspecified constipation    CHRONIC   Unstable gait 01/21/2020    Past Surgical History:  Procedure Laterality Date   CATARACT EXTRACTION, BILATERAL      Current Medications: Current Meds  Medication Sig   cyanocobalamin (,VITAMIN B-12,) 1000 MCG/ML injection INJECT 1 ML  (1,000 MCG TOTAL) INTO THE MUSCLE EVERY 30 DAYS.   levothyroxine (SYNTHROID) 50 MCG tablet TAKE 1 TABLET BY MOUTH EVERY DAY   Magnesium Hydroxide (DULCOLAX PO) Take 1 tablet by mouth daily as needed.   memantine (NAMENDA) 5 MG tablet TAKE 1 TABLET BY MOUTH TWICE A DAY   simvastatin (ZOCOR) 20 MG tablet TAKE 1 TABLET BY MOUTH EVERY DAY   verapamil (CALAN-SR) 240 MG CR tablet TAKE 1 TABLET BY MOUTH EVERY DAY     Allergies:   Aricept [donepezil]   Social History   Socioeconomic History   Marital status: Widowed    Spouse name: Not on file   Number of children: 2   Years of education: Not on file   Highest education level: Not on file  Occupational History   Occupation: retired   Occupation: Copywriter, advertising  Tobacco Use   Smoking status: Former    Types: Cigarettes    Quit date: 2008    Years since quitting: 15.7   Smokeless tobacco: Current    Types: Chew   Tobacco comments:    1 pouch sometimes  Vaping Use   Vaping Use: Never used  Substance and Sexual Activity   Alcohol use: No   Drug use: No   Sexual activity: Yes  Other Topics Concern   Not on file  Social History Narrative   Not on file   Social Determinants of Health   Financial Resource Strain: Low Risk  (12/01/2021)   Overall Financial Resource Strain (CARDIA)    Difficulty of Paying Living Expenses: Not hard at all  Food Insecurity: No Food Insecurity (09/08/2020)   Hunger Vital Sign    Worried About Running Out of Food in the Last Year: Never true    [place] in the Last Year: Never true  Transportation Needs: No Transportation Needs (12/01/2021)   PRAPARE - Hydrologist (Medical): No    Lack of Transportation (Non-Medical): No  Physical Activity: Insufficiently Active (09/08/2020)   Exercise Vital Sign    Days of Exercise per Week: 2 days    Minutes of Exercise per Session: 60 min  Stress: No Stress Concern Present (09/08/2020)   [place]    Feeling of Stress : Not at all  Social Connections: Moderately Isolated (09/08/2020)   Social Connection and Isolation Panel [NHANES]    Frequency of Communication with Friends and Family: Once a week    Frequency of Social Gatherings with Friends and Family: More than three times a week    Attends Religious Services: Never    Marine scientist or Organizations: No    Attends Music therapist: More than 4 times per year    Marital Status: Widowed     Family History: The patient's family history is negative for Hypertension, Heart disease, Cancer, and Diabetes.  ROS:   Please see the history of present illness.  All other systems reviewed and are negative.  EKGs/Labs/Other Studies Reviewed:    The following studies were reviewed today: EKG reveals atrial fibrillation with well-controlled ventricular rate.   Recent Labs: 02/24/2022: ALT 19; BUN 18; Creatinine, Ser 1.22; Hemoglobin 11.2; Platelets 211; Potassium 3.9; Sodium 139; TSH 3.680  Recent Lipid Panel    Component Value Date/Time   CHOL 143 12/22/2021 1000   TRIG 61 12/22/2021 1000   HDL 58 12/22/2021 1000   CHOLHDL 2.5 12/22/2021 1000   LDLCALC 72 12/22/2021 1000    Physical Exam:    VS:  BP (!) 108/58   Pulse 71   Ht '5\' 6"'$  (1.676 m)   Wt 112 lb (50.8 kg)   SpO2 95%   BMI 18.08 kg/m     Wt Readings from Last 3 Encounters:  06/03/22 112 lb (50.8 kg)  03/10/22 110 lb (49.9 kg)  02/24/22 107 lb (48.5 kg)     GEN: Patient is in no acute distress HEENT: Normal NECK: No JVD; No carotid bruits LYMPHATICS: No lymphadenopathy CARDIAC: Hear sounds regular, 2/6 systolic murmur at the apex. RESPIRATORY:  Clear to auscultation without rales, wheezing or rhonchi  ABDOMEN: Soft, non-tender, non-distended MUSCULOSKELETAL:  No edema; No deformity  SKIN: Warm and dry NEUROLOGIC:  Alert and oriented x 3 PSYCHIATRIC:  Normal affect   Signed, Jenean Lindau, MD  06/03/2022 2:31 PM    [place]

## 2022-06-05 LAB — BASIC METABOLIC PANEL
BUN/Creatinine Ratio: 16 (ref 10–24)
BUN: 20 mg/dL (ref 10–36)
CO2: 21 mmol/L (ref 20–29)
Calcium: 9 mg/dL (ref 8.6–10.2)
Chloride: 105 mmol/L (ref 96–106)
Creatinine, Ser: 1.26 mg/dL (ref 0.76–1.27)
Glucose: 106 mg/dL — ABNORMAL HIGH (ref 70–99)
Potassium: 3.8 mmol/L (ref 3.5–5.2)
Sodium: 141 mmol/L (ref 134–144)
eGFR: 54 mL/min/{1.73_m2} — ABNORMAL LOW (ref >59)

## 2022-06-05 LAB — CBC
Hematocrit: 38 % (ref 37.5–51.0)
Hemoglobin: 13 g/dL (ref 13.0–17.7)
MCH: 31.6 pg (ref 26.6–33.0)
MCHC: 34.2 g/dL (ref 31.5–35.7)
MCV: 92 fL (ref 79–97)
Platelets: 192 10*3/uL (ref 150–450)
RBC: 4.12 x10E6/uL — ABNORMAL LOW (ref 4.14–5.80)
RDW: 12.3 % (ref 11.6–15.4)
WBC: 5.9 10*3/uL (ref 3.4–10.8)

## 2022-06-05 LAB — TSH: TSH: 3.91 u[IU]/mL (ref 0.450–4.500)

## 2022-06-14 ENCOUNTER — Ambulatory Visit: Payer: Medicare Other | Admitting: Dermatology

## 2022-06-17 ENCOUNTER — Ambulatory Visit: Payer: Medicare Other | Attending: Cardiology

## 2022-06-17 DIAGNOSIS — I350 Nonrheumatic aortic (valve) stenosis: Secondary | ICD-10-CM | POA: Diagnosis not present

## 2022-06-17 DIAGNOSIS — I4891 Unspecified atrial fibrillation: Secondary | ICD-10-CM | POA: Diagnosis not present

## 2022-06-17 LAB — ECHOCARDIOGRAM COMPLETE
AR max vel: 1.04 cm2
AV Area VTI: 1.19 cm2
AV Area mean vel: 0.98 cm2
AV Mean grad: 10.3 mmHg
AV Peak grad: 17.1 mmHg
Ao pk vel: 2.07 m/s
Area-P 1/2: 3.6 cm2
S' Lateral: 3.45 cm

## 2022-06-21 ENCOUNTER — Telehealth: Payer: Self-pay | Admitting: Cardiology

## 2022-06-21 NOTE — Telephone Encounter (Signed)
Left a message for the pt to call back.  

## 2022-06-21 NOTE — Telephone Encounter (Signed)
Pt's daughter returning call regarding pt. Please advise

## 2022-06-22 NOTE — Telephone Encounter (Signed)
Left VM for Anderson Malta to callback.

## 2022-06-23 NOTE — Telephone Encounter (Signed)
Lucas Kline states that the DOT has not received the paperwork form Friday that was faxed. Lucas Kline will call DOT on Thursday and if not received she will reach out to the [place] front office.

## 2022-06-23 NOTE — Telephone Encounter (Signed)
Left VM for Anderson Malta to callback.

## 2022-06-23 NOTE — Telephone Encounter (Signed)
Patient states she keeps missing a call and thinks it is for a fax number. She says the fax is for the [facility]: [phone]

## 2022-06-29 ENCOUNTER — Ambulatory Visit (INDEPENDENT_AMBULATORY_CARE_PROVIDER_SITE_OTHER): Payer: Medicare Other

## 2022-06-29 ENCOUNTER — Ambulatory Visit: Payer: Medicare Other | Admitting: Legal Medicine

## 2022-06-29 NOTE — Progress Notes (Signed)
Chronic Care Management Pharmacy Note  06/29/2022 Name:  Lucas Kline MRN:  [id number] DOB:  July 02, 1929   Plan Updates:  Simvastatin is contraindicated for combo with Verapamil. Recommend changing to Rosuvastatin. Will co-sign PCP to let them know  Subjective: Lucas Kline is an 88 y.o. year old male who is a primary patient of Rip Harbour, NP.  The CCM team was consulted for assistance with disease management and care coordination needs.    Engaged with patient by telephone for follow up visit in response to provider referral for pharmacy case management and/or care coordination services.   Consent to Services:  The patient was given the following information about Chronic Care Management services today, agreed to services, and gave verbal consent: 1. CCM service includes personalized support from designated clinical staff supervised by the primary care provider, including individualized plan of care and coordination with other care providers 2. 24/7 contact phone numbers for assistance for urgent and routine care needs. 3. Service will only be billed when office clinical staff spend 20 minutes or more in a month to coordinate care. 4. Only one practitioner may furnish and bill the service in a calendar month. 5.The patient may stop CCM services at any time (effective at the end of the month) by phone call to the office staff. 6. The patient will be responsible for cost sharing (co-pay) of up to 20% of the service fee (after annual deductible is met). Patient agreed to services and consent obtained.  Patient Care Team: Rip Harbour, NP as PCP - General (Nurse Practitioner) Lane Hacker, [facility] (Pharmacist)  Recent office visits:  07/01/21 Marge Duncans PA-C Seen for neck pain. Started on Prednisone 20 mg 2 times daily.   Recent consult visits:  None    Hospital visits:  None  Objective:  Lab Results  Component Value Date   CREATININE 1.26 06/03/2022    BUN 20 06/03/2022   GFRNONAA 50 (L) 10/23/2020   GFRAA 58 (L) 10/23/2020   NA 141 06/03/2022   K 3.8 06/03/2022   CALCIUM 9.0 06/03/2022   CO2 21 06/03/2022   GLUCOSE 106 (H) 06/03/2022    No results found for: "HGBA1C", "FRUCTOSAMINE", "GFR", "MICROALBUR"  Last diabetic Eye exam: No results found for: "HMDIABEYEEXA"  Last diabetic Foot exam: No results found for: "HMDIABFOOTEX"   Lab Results  Component Value Date   CHOL 143 12/22/2021   HDL 58 12/22/2021   LDLCALC 72 12/22/2021   TRIG 61 12/22/2021   CHOLHDL 2.5 12/22/2021       Latest Ref Rng & Units 02/24/2022    2:35 PM 12/22/2021   10:00 AM 09/28/2021   10:39 AM  Hepatic Function  Total Protein 6.0 - 8.5 g/dL 6.5  6.2  6.0   Albumin 3.5 - 4.6 g/dL 3.6  4.1  4.2   AST 0 - 40 IU/L _0 ALT 0 - 44 IU/L _1 Alk Phosphatase 44 - 121 IU/L 68  51  51   Total Bilirubin 0.0 - 1.2 mg/dL 0.5  0.5  0.4     Lab Results  Component Value Date/Time   TSH 3.910 06/03/2022 02:48 PM   TSH 3.680 02/24/2022 02:36 PM       Latest Ref Rng & Units 06/03/2022    2:48 PM 02/24/2022    2:35 PM 12/22/2021   10:00 AM  CBC  WBC 3.4 - 10.8 x10E3/uL 5.9  6.2  4.6   Hemoglobin 13.0 - 17.7 g/dL 13.0  11.2  12.5   Hematocrit 37.5 - 51.0 % 38.0  30.9  36.3   Platelets 150 - 450 x10E3/uL 192  211  165     No results found for: "VD25OH"  Clinical ASCVD: Yes  The ASCVD Risk score (Arnett DK, et al., 2019) failed to calculate for the following reasons:   The 2019 ASCVD risk score is only valid for ages 56 to 89   The patient has a prior MI or stroke diagnosis       12/22/2021    9:14 AM 09/08/2020    2:25 PM 01/21/2020   10:28 AM  Depression screen PHQ 2/9  Decreased Interest 0 0 0  Down, Depressed, Hopeless 0 0 0  PHQ - 2 Score 0 0 0     Social History   Tobacco Use  Smoking Status Former   Types: Cigarettes   Quit date: 2008   Years since quitting: 15.8  Smokeless Tobacco Current   Types: Chew  Tobacco  Comments   1 pouch sometimes   BP Readings from Last 3 Encounters:  06/03/22 (!) 108/58  03/10/22 (!) 108/54  02/24/22 (!) 84/50   Pulse Readings from Last 3 Encounters:  06/03/22 71  03/10/22 67  02/24/22 84   Wt Readings from Last 3 Encounters:  06/03/22 112 lb (50.8 kg)  03/10/22 110 lb (49.9 kg)  02/24/22 107 lb (48.5 kg)   BMI Readings from Last 3 Encounters:  06/03/22 18.08 kg/m  03/10/22 17.75 kg/m  02/24/22 17.27 kg/m    Assessment/Interventions: Review of patient past medical history, allergies, medications, health status, including review of consultants reports, laboratory and other test data, was performed as part of comprehensive evaluation and provision of chronic care management services.   SDOH:  (Social Determinants of Health) assessments and interventions performed: Yes SDOH Interventions    Flowsheet Row Chronic Care Management from 06/29/2022 in [place] Management from 12/01/2021 in [place] Management from 01/01/2021 in [place] Office Visit from 09/08/2020 in [place] Interventions      Food Insecurity Interventions -- -- -- Intervention Not Indicated  Housing Interventions -- -- Intervention Not Indicated Intervention Not Indicated  Transportation Interventions Patient Refused Intervention Not Indicated -- Intervention Not Indicated  Financial Strain Interventions Patient Refused Intervention Not Indicated -- Intervention Not Indicated  Physical Activity Interventions -- -- -- Intervention Not Indicated  Stress Interventions -- -- -- Intervention Not Indicated  Social Connections Interventions -- -- -- Intervention Not Indicated      SDOH Screenings   Food Insecurity: No Food Insecurity (09/08/2020)  Housing: Low Risk  (01/01/2021)  Transportation Needs: Unknown (06/29/2022)  Alcohol Screen: Low Risk  (09/08/2020)  Depression (PHQ2-9): Low Risk  (12/22/2021)  Financial  Resource Strain: Unknown (06/29/2022)  Physical Activity: Insufficiently Active (09/08/2020)  Social Connections: Moderately Isolated (09/08/2020)  Stress: No Stress Concern Present (09/08/2020)  Tobacco Use: High Risk (06/03/2022)    CCM Care Plan  Allergies  Allergen Reactions   Aricept [Donepezil] Other (See Comments)    Abdominal pain, diarrhea    Medications Reviewed Today     Reviewed by Truddie Hidden, RN (Registered Nurse) on 06/03/22 at 1358  Med List Status: <None>   Medication Order Taking? Sig Documenting Provider Last Dose Status Informant  cyanocobalamin (,VITAMIN B-12,) 1000 MCG/ML injection [id number] Yes INJECT 1 ML (1,000 MCG TOTAL) INTO THE MUSCLE  EVERY 30 DAYS. Lillard Anes, MD Taking Active   levothyroxine (SYNTHROID) 50 MCG tablet [id number] Yes TAKE 1 TABLET BY MOUTH EVERY DAY Lillard Anes, MD Taking Active   Magnesium Hydroxide (DULCOLAX PO) [id number] Yes Take 1 tablet by mouth daily as needed. [provider] Taking Active   memantine (NAMENDA) 5 MG tablet [id number] Yes TAKE 1 TABLET BY MOUTH TWICE A DAY Lillard Anes, MD Taking Active   simvastatin (ZOCOR) 20 MG tablet [id number] Yes TAKE 1 TABLET BY MOUTH EVERY DAY Revankar, Reita Cliche, MD Taking Active   verapamil (CALAN-SR) 240 MG CR tablet [id number] Yes TAKE 1 TABLET BY MOUTH EVERY DAY Lillard Anes, MD Taking Active             Patient Active Problem List   Diagnosis Date Noted   Moderate aortic stenosis 06/03/2022   Atrial fibrillation ([place]) 06/03/2022   Adult BMI <19 kg/sq m 12/22/2021   Contusion of left knee 09/28/2021   Neck pain 07/01/2021   Essential hypertension, benign    B12 deficiency    Essential tremor    Mixed hearing loss, bilateral    Bilateral carotid bruits 01/28/2020   Primary hypothyroidism 01/22/2020   Unstable gait 01/21/2020   Obstructive chronic bronchitis without exacerbation 01/21/2020   Malnutrition of moderate degree  ([place]) 01/21/2020   Chronic atrial fibrillation ([place]) 01/21/2020   Personal history of malignant neoplasm of larynx 01/18/2020   Osteoarthritis    Alzheimer disease ([place])    CKD (chronic kidney disease), stage III (HCC)    Atherosclerotic heart disease of native coronary artery without angina pectoris    Mixed hyperlipidemia     Immunization History  Administered Date(s) Administered   Pneumococcal Conjugate-13 01/17/2014   Pneumococcal Polysaccharide-23 01/17/2014   Tdap 12/29/2011    Conditions to be addressed/monitored:  Hypertension, Hyperlipidemia, Diabetes, COPD and Hypothyroidism  Care Plan : ccm pharmacy care plan  Updates made by Lane Hacker, RPH since 06/29/2022 12:00 AM     Problem: htn, hld, dm, copd, hypothyroidism   Priority: High  Onset Date: 01/01/2021     Long-Range Goal: Disease State Management   Start Date: 01/01/2021  Expected End Date: 01/01/2022  Recent Progress: On track  Priority: High  Note:    Current Barriers:  Unable to achieve control of shortness of breath   Pharmacist Clinical Goal(s):  Patient will maintain control of COPD  as evidenced by shortness of breath  through collaboration with PharmD and provider.   Interventions: 1:1 collaboration with Lillard Anes, MD regarding development and update of comprehensive plan of care as evidenced by provider attestation and co-signature Inter-disciplinary care team collaboration (see longitudinal plan of care) Comprehensive medication review performed; medication list updated in electronic medical record  Hypertension (BP goal <140/90) BP Readings from Last 3 Encounters:  09/28/21 118/68  08/31/21 102/66  07/15/21 104/64  -Controlled -Current treatment: verapamil CR 240 mg daily Appropriate, Effective, Safe, Accessible -Medications previously tried: none reported  -Current home readings:  doesn't have a machine to check bp at home  -Current dietary habits:  Does not enjoy  boost or ensure. Eats a lot of hamburger for protein source. Doesn't drink water. Drinks decaf coffee with each meal. Some water between. Doesn't prefer chicken. Eats hamburgers and hot dogs. Eats fruits and vegetables.  -Current exercise habits: Walks about mile most days as long as it's not too cold or raining.  -Denies hypotensive/hypertensive symptoms -Educated on BP goals and benefits of medications  for prevention of heart attack, stroke and kidney damage; Daily salt intake goal < 2300 mg; Exercise goal of 150 minutes per week; Importance of home blood pressure monitoring; -Counseled to monitor BP at home as needed, document, and provide log at future appointments -Counseled on diet and exercise extensively October 2023: Spoke with daughter. She was in her own doctor's office for a visit. Unable to speak long. Will move to self-care  Hyperlipidemia: (LDL goal < 100)  -Controlled -Current treatment: simvastatin 20 mg daily Appropriate, Effective, Safe, Accessible -Medications previously tried: none reported  -Current dietary patterns: doesn't like chicken. Eats ground beef, hamburgers and hot dogs.  -Current exercise habits: walks 1-2 miles most days.  -Educated on Cholesterol goals;  Benefits of statin for ASCVD risk reduction; Importance of limiting foods high in cholesterol; Exercise goal of 150 minutes per week; -Counseled on diet and exercise extensively April 2022: Recommended consider changing to atorvastatin or rosuvastatin due to interaction with verapamil.  October 2023: Spoke with daughter. She was in her own doctor's office for a visit. Unable to speak long. Will move to self-care. Recommended consider changing to atorvastatin or rosuvastatin due to interaction with verapamil.   Atrial Fibrillation (Goal: prevent stroke and major bleeding) -Controlled -CHADSVASC: 5 -Current treatment: Rate control: verapamil cr 240 mg daily Appropriate, Effective, Safe,  Accessible Anticoagulation: Eliquis Appropriate, Effective, Safe, Accessible -Medications previously tried: not reported -Home BP and HR readings: not checking at home   -Counseled on increased risk of stroke due to Afib and benefits of anticoagulation for stroke prevention; -Counseled on diet and exercise extensively October 2023: Spoke with daughter. She was in her own doctor's office for a visit. Unable to speak long. Will move to self-care.   Alzheimer Disease (Goal: maintain memory) -Controlled -Current treatment  memantine 5 mg bid Appropriate, Effective, Safe, Accessible -Medications previously tried: donepezil  October 2023: Spoke with daughter. She was in her own doctor's office for a visit. Unable to speak long. Will move to self-care.   Patient Goals/Self-Care Activities Patient will:  - take medications as prescribed focus on medication adherence by using pill box target a minimum of 150 minutes of moderate intensity exercise weekly  Follow Up Plan: Telephone follow up appointment with care management team member scheduled for: prn  [place] Constable, Pharm.D. - [phone]       Medication Assistance: None required.  Patient affirms current coverage meets needs.  Patient's preferred pharmacy is:  [id number] [id number]: [phone]: [phone] Uses pill box? Yes Pt endorses good compliance - daughter is filling pill box and patient doing well  We discussed: Benefits of medication synchronization, packaging and delivery as well as enhanced pharmacist oversight with [employer]. Patient decided to: Continue current medication management strategy  Care Plan and Follow Up Patient Decision:  Patient agrees to Care Plan and Follow-up.  Plan: Telephone follow up appointment with care management team member scheduled for:  prn  NArizona Constable Pharm.D. --  [phone]

## 2022-06-29 NOTE — Patient Instructions (Signed)
Visit Information   Goals Addressed   None    Patient Care Plan: ccm pharmacy care plan     Problem Identified: htn, hld, dm, copd, hypothyroidism   Priority: High  Onset Date: 01/01/2021     Long-Range Goal: Disease State Management   Start Date: 01/01/2021  Expected End Date: 01/01/2022  Recent Progress: On track  Priority: High  Note:    Current Barriers:  Unable to achieve control of shortness of breath   Pharmacist Clinical Goal(s):  Patient will maintain control of COPD  as evidenced by shortness of breath  through collaboration with PharmD and provider.   Interventions: 1:1 collaboration with Lillard Anes, MD regarding development and update of comprehensive plan of care as evidenced by provider attestation and co-signature Inter-disciplinary care team collaboration (see longitudinal plan of care) Comprehensive medication review performed; medication list updated in electronic medical record  Hypertension (BP goal <140/90) BP Readings from Last 3 Encounters:  09/28/21 118/68  08/31/21 102/66  07/15/21 104/64  -Controlled -Current treatment: verapamil CR 240 mg daily Appropriate, Effective, Safe, Accessible -Medications previously tried: none reported  -Current home readings:  doesn't have a machine to check bp at home  -Current dietary habits:  Does not enjoy boost or ensure. Eats a lot of hamburger for protein source. Doesn't drink water. Drinks decaf coffee with each meal. Some water between. Doesn't prefer chicken. Eats hamburgers and hot dogs. Eats fruits and vegetables.  -Current exercise habits: Walks about mile most days as long as it's not too cold or raining.  -Denies hypotensive/hypertensive symptoms -Educated on BP goals and benefits of medications for prevention of heart attack, stroke and kidney damage; Daily salt intake goal < 2300 mg; Exercise goal of 150 minutes per week; Importance of home blood pressure monitoring; -Counseled to monitor  BP at home as needed, document, and provide log at future appointments -Counseled on diet and exercise extensively October 2023: Spoke with daughter. She was in her own doctor's office for a visit. Unable to speak long. Will move to self-care  Hyperlipidemia: (LDL goal < 100)  -Controlled -Current treatment: simvastatin 20 mg daily Appropriate, Effective, Safe, Accessible -Medications previously tried: none reported  -Current dietary patterns: doesn't like chicken. Eats ground beef, hamburgers and hot dogs.  -Current exercise habits: walks 1-2 miles most days.  -Educated on Cholesterol goals;  Benefits of statin for ASCVD risk reduction; Importance of limiting foods high in cholesterol; Exercise goal of 150 minutes per week; -Counseled on diet and exercise extensively April 2022: Recommended consider changing to atorvastatin or rosuvastatin due to interaction with verapamil.  October 2023: Spoke with daughter. She was in her own doctor's office for a visit. Unable to speak long. Will move to self-care. Recommended consider changing to atorvastatin or rosuvastatin due to interaction with verapamil.   Atrial Fibrillation (Goal: prevent stroke and major bleeding) -Controlled -CHADSVASC: 5 -Current treatment: Rate control: verapamil cr 240 mg daily Appropriate, Effective, Safe, Accessible Anticoagulation: Eliquis Appropriate, Effective, Safe, Accessible -Medications previously tried: not reported -Home BP and HR readings: not checking at home   -Counseled on increased risk of stroke due to Afib and benefits of anticoagulation for stroke prevention; -Counseled on diet and exercise extensively October 2023: Spoke with daughter. She was in her own doctor's office for a visit. Unable to speak long. Will move to self-care.   Alzheimer Disease (Goal: maintain memory) -Controlled -Current treatment  memantine 5 mg bid Appropriate, Effective, Safe, Accessible -Medications previously tried:  donepezil  October  2023: Spoke with daughter. She was in her own doctor's office for a visit. Unable to speak long. Will move to self-care.   Patient Goals/Self-Care Activities Patient will:  - take medications as prescribed focus on medication adherence by using pill box target a minimum of 150 minutes of moderate intensity exercise weekly  Follow Up Plan: Telephone follow up appointment with care management team member scheduled for: prn  [place] Constable, Pharm.D. - [phone]      Mr. Harlin was given information about Chronic Care Management services today including:  CCM service includes personalized support from designated clinical staff supervised by his physician, including individualized plan of care and coordination with other care providers 24/7 contact phone numbers for assistance for urgent and routine care needs. Standard insurance, coinsurance, copays and deductibles apply for chronic care management only during months in which we provide at least 20 minutes of these services. Most insurances cover these services at 100%, however patients may be responsible for any copay, coinsurance and/or deductible if applicable. This service may help you avoid the need for more expensive face-to-face services. Only one practitioner may furnish and bill the service in a calendar month. The patient may stop CCM services at any time (effective at the end of the month) by phone call to the office staff.  Patient agreed to services and verbal consent obtained.   The patient verbalized understanding of instructions, educational materials, and care plan provided today and DECLINED offer to receive copy of patient instructions, educational materials, and care plan.  CCM F/U PRN  Lane Hacker, [place]

## 2022-06-30 ENCOUNTER — Telehealth: Payer: Self-pay

## 2022-06-30 NOTE — Telephone Encounter (Signed)
Left message for patient to call back to schedule AWV.

## 2022-07-01 ENCOUNTER — Encounter: Payer: Self-pay | Admitting: Cardiology

## 2022-07-01 ENCOUNTER — Encounter: Payer: Self-pay | Admitting: Legal Medicine

## 2022-07-13 ENCOUNTER — Ambulatory Visit: Payer: Medicare Other | Admitting: Legal Medicine

## 2022-07-13 DIAGNOSIS — F0280 Dementia in other diseases classified elsewhere without behavioral disturbance: Secondary | ICD-10-CM

## 2022-07-13 DIAGNOSIS — E782 Mixed hyperlipidemia: Secondary | ICD-10-CM

## 2022-07-13 DIAGNOSIS — G309 Alzheimer's disease, unspecified: Secondary | ICD-10-CM

## 2022-07-13 DIAGNOSIS — I10 Essential (primary) hypertension: Secondary | ICD-10-CM

## 2022-07-21 ENCOUNTER — Other Ambulatory Visit: Payer: Self-pay

## 2022-07-21 DIAGNOSIS — I2510 Atherosclerotic heart disease of native coronary artery without angina pectoris: Secondary | ICD-10-CM | POA: Insufficient documentation

## 2022-07-23 ENCOUNTER — Encounter: Payer: Self-pay | Admitting: Cardiology

## 2022-07-23 ENCOUNTER — Ambulatory Visit: Payer: Medicare Other | Attending: Cardiology | Admitting: Cardiology

## 2022-07-23 VITALS — BP 122/56 | HR 78 | Ht 65.0 in | Wt 113.8 lb

## 2022-07-23 DIAGNOSIS — I2510 Atherosclerotic heart disease of native coronary artery without angina pectoris: Secondary | ICD-10-CM | POA: Diagnosis not present

## 2022-07-23 DIAGNOSIS — I350 Nonrheumatic aortic (valve) stenosis: Secondary | ICD-10-CM | POA: Diagnosis not present

## 2022-07-23 DIAGNOSIS — I10 Essential (primary) hypertension: Secondary | ICD-10-CM

## 2022-07-23 DIAGNOSIS — I4891 Unspecified atrial fibrillation: Secondary | ICD-10-CM

## 2022-07-23 NOTE — Patient Instructions (Signed)
Medication Instructions:  Your physician recommends that you continue on your current medications as directed. Please refer to the Current Medication list given to you today.  *If you need a refill on your cardiac medications before your next appointment, please call your pharmacy*   Lab Work: None Ordered If you have labs (blood work) drawn today and your tests are completely normal, you will receive your results only by: [place] (if you have MyChart) OR A paper copy in the mail If you have any lab test that is abnormal or we need to change your treatment, we will call you to review the results.   Testing/Procedures: None Ordered   Follow-Up: At [facility], you and your health needs are our priority.  As part of our continuing mission to provide you with exceptional heart care, we have created designated Provider Care Teams.  These Care Teams include your primary Cardiologist (physician) and Advanced Practice Providers (APPs -  Physician Assistants and Nurse Practitioners) who all work together to provide you with the care you need, when you need it.  We recommend signing up for the patient portal called "MyChart".  Sign up information is provided on this After Visit Summary.  MyChart is used to connect with patients for Virtual Visits (Telemedicine).  Patients are able to view lab/test results, encounter notes, upcoming appointments, etc.  Non-urgent messages can be sent to your provider as well.   To learn more about what you can do with MyChart, go to [email or web address].    Your next appointment:   9 month(s)  The format for your next appointment:   In Person  Provider:   Jyl Heinz, MD    Other Instructions NA

## 2022-07-23 NOTE — Progress Notes (Signed)
Cardiology Office Note:    Date:  07/23/2022   ID:  Lucas Kline, DOB 1928/10/26, MRN [id number]  PCP:  Rip Harbour, NP  Cardiologist:  Jenean Lindau, MD   Referring MD: Rip Harbour, NP    ASSESSMENT:    Persistent atrial fibrillation Coronary artery disease Essential hypertension  Mixed dyslipidemia PLAN:    In order of problems listed above:  Coronary atherosclerosis: Secondary prevention stressed with the patient.  Importance of compliance with diet medication stressed any vocalized understanding. Essential hypertension: Blood pressure is stable and diet was emphasized.  Lifestyle modification urged. Persistent atrial fibrillation:I discussed with the patient atrial fibrillation, disease process. Management and therapy including rate and rhythm control, anticoagulation benefits and potential risks were discussed extensively with the patient. Patient had multiple questions which were answered to patient's satisfaction.  Fall precaution was advised.  He tells me that he is steady on his feet and he walks the dogs on a regular basis. Patient will be seen in follow-up appointment in 6 months or earlier if the patient has any concerns    Medication Adjustments/Labs and Tests Ordered: Current medicines are reviewed at length with the patient today.  Concerns regarding medicines are outlined above.  No orders of the defined types were placed in this encounter.  No orders of the defined types were placed in this encounter.    No chief complaint on file.    History of Present Illness:    Lucas Kline is a 88 y.o. male.  Patient has past medical history of persistent atrial fibrillation on anticoagulation, essential hypertension, mixed dyslipidemia.  He denies any problems at this time and takes care of activities of daily living.  No chest pain orthopnea or PND.  He mentions to me that he walks the dogs on a regular basis.  At the time of my  evaluation, the patient is alert awake oriented and in no distress.  Her  Past Medical History:  Diagnosis Date   Adult BMI <19 kg/sq m 01/21/2020   Alzheimer disease ([place])    Atherosclerotic heart disease of native coronary artery without angina pectoris    CHRONIC    Atrial fibrillation (HCC) 06/03/2022   B12 deficiency    CHRONIC   Bilateral carotid bruits 01/28/2020   Chronic atrial fibrillation ([place]) 01/21/2020   CKD (chronic kidney disease), stage II    MILD   CKD (chronic kidney disease), stage III (HCC)    MODERATE (CHRONIC)   Contusion of left knee 09/28/2021   Coronary atherosclerosis    CHRONIC   Essential hypertension 01/28/2020   Essential hypertension, benign    Essential tremor    CHRONIC   Malnutrition of moderate degree ([place]) 01/21/2020   Memory deficit 09/08/2020   Mixed hearing loss, bilateral    CHRONIC   Mixed hyperlipidemia    CHRONIC    Neck pain 07/01/2021   Obstructive chronic bronchitis without exacerbation 01/21/2020   Osteoarthritis    OF KNEE CHRONIC   Personal history of malignant neoplasm of larynx 01/18/2020   Primary hypothyroidism 01/22/2020   Unstable gait 01/21/2020    Past Surgical History:  Procedure Laterality Date   CATARACT EXTRACTION, BILATERAL      Current Medications: Current Meds  Medication Sig   apixaban (ELIQUIS) 2.5 MG TABS tablet Take 1 tablet (2.5 mg total) by mouth 2 (two) times daily.   cyanocobalamin (,VITAMIN B-12,) 1000 MCG/ML injection INJECT 1 ML (1,000 MCG TOTAL) INTO THE MUSCLE EVERY 30  DAYS.   levothyroxine (SYNTHROID) 50 MCG tablet TAKE 1 TABLET BY MOUTH EVERY DAY   Magnesium Hydroxide (DULCOLAX PO) Take 1 tablet by mouth daily as needed (constipation).   memantine (NAMENDA) 5 MG tablet TAKE 1 TABLET BY MOUTH TWICE A DAY   simvastatin (ZOCOR) 20 MG tablet TAKE 1 TABLET BY MOUTH EVERY DAY   verapamil (CALAN-SR) 240 MG CR tablet TAKE 1 TABLET BY MOUTH EVERY DAY     Allergies:   Aricept [donepezil]    Social History   Socioeconomic History   Marital status: Widowed    Spouse name: Not on file   Number of children: 2   Years of education: Not on file   Highest education level: Not on file  Occupational History   Occupation: retired   Occupation: Copywriter, advertising  Tobacco Use   Smoking status: Former    Types: Cigarettes    Quit date: 2008    Years since quitting: 15.8   Smokeless tobacco: Current    Types: Chew   Tobacco comments:    1 pouch sometimes  Vaping Use   Vaping Use: Never used  Substance and Sexual Activity   Alcohol use: No   Drug use: No   Sexual activity: Yes  Other Topics Concern   Not on file  Social History Narrative   Not on file   Social Determinants of Health   Financial Resource Strain: Unknown (06/29/2022)   Overall Financial Resource Strain (CARDIA)    Difficulty of Paying Living Expenses: Patient refused  Food Insecurity: No Food Insecurity (09/08/2020)   Hunger Vital Sign    Worried About Running Out of Food in the Last Year: Never true    [place] in the Last Year: Never true  Transportation Needs: Unknown (06/29/2022)   [place] - Transportation    Lack of Transportation (Medical): Patient refused    Lack of Transportation (Non-Medical): Patient refused  Physical Activity: Insufficiently Active (09/08/2020)   Exercise Vital Sign    Days of Exercise per Week: 2 days    Minutes of Exercise per Session: 60 min  Stress: No Stress Concern Present (09/08/2020)   [place] of Stress : Not at all  Social Connections: Moderately Isolated (09/08/2020)   Social Connection and Isolation Panel [NHANES]    Frequency of Communication with Friends and Family: Once a week    Frequency of Social Gatherings with Friends and Family: More than three times a week    Attends Religious Services: Never    Marine scientist or Organizations: No    Attends Programme researcher, broadcasting/film/video: More than 4 times per year    Marital Status: Widowed     Family History: The patient's family history is negative for Hypertension, Heart disease, Cancer, and Diabetes.  ROS:   Please see the history of present illness.    All other systems reviewed and are negative.  EKGs/Labs/Other Studies Reviewed:    The following studies were reviewed today: I discussed my findings with the patient at length   Recent Labs: 02/24/2022: ALT 19 06/03/2022: BUN 20; Creatinine, Ser 1.26; Hemoglobin 13.0; Platelets 192; Potassium 3.8; Sodium 141; TSH 3.910  Recent Lipid Panel    Component Value Date/Time   CHOL 143 12/22/2021 1000   TRIG 61 12/22/2021 1000   HDL 58 12/22/2021 1000   CHOLHDL 2.5 12/22/2021 1000   LDLCALC 72 12/22/2021 1000    Physical  Exam:    VS:  BP (!) 122/56   Pulse 78   Ht '5\' 5"'$  (1.651 m)   Wt 113 lb 12.8 oz (51.6 kg)   SpO2 95%   BMI 18.94 kg/m     Wt Readings from Last 3 Encounters:  07/23/22 113 lb 12.8 oz (51.6 kg)  06/03/22 112 lb (50.8 kg)  03/10/22 110 lb (49.9 kg)     GEN: Patient is in no acute distress HEENT: Normal NECK: No JVD; No carotid bruits LYMPHATICS: No lymphadenopathy CARDIAC: Hear sounds regular, 2/6 systolic murmur at the apex. RESPIRATORY:  Clear to auscultation without rales, wheezing or rhonchi  ABDOMEN: Soft, non-tender, non-distended MUSCULOSKELETAL:  No edema; No deformity  SKIN: Warm and dry NEUROLOGIC:  Alert and oriented x 3 PSYCHIATRIC:  Normal affect   Signed, Jenean Lindau, MD  07/23/2022 4:29 PM    [place]

## 2022-07-25 ENCOUNTER — Other Ambulatory Visit: Payer: Self-pay | Admitting: Nurse Practitioner

## 2022-07-25 DIAGNOSIS — I10 Essential (primary) hypertension: Secondary | ICD-10-CM

## 2022-07-25 DIAGNOSIS — E782 Mixed hyperlipidemia: Secondary | ICD-10-CM

## 2022-07-25 DIAGNOSIS — Z9981 Dependence on supplemental oxygen: Secondary | ICD-10-CM

## 2022-07-28 ENCOUNTER — Telehealth: Payer: Self-pay | Admitting: Nurse Practitioner

## 2022-07-28 NOTE — Telephone Encounter (Signed)
LEFT VOICEMAIL FOR THE PT TO CALL TO SCHEDULE A CHRONIC APPT. PER KATIE CMA

## 2022-07-31 ENCOUNTER — Other Ambulatory Visit: Payer: Self-pay | Admitting: Legal Medicine

## 2022-07-31 DIAGNOSIS — G309 Alzheimer's disease, unspecified: Secondary | ICD-10-CM

## 2022-08-12 ENCOUNTER — Ambulatory Visit (INDEPENDENT_AMBULATORY_CARE_PROVIDER_SITE_OTHER): Payer: Medicare Other | Admitting: Nurse Practitioner

## 2022-08-12 ENCOUNTER — Encounter: Payer: Self-pay | Admitting: Nurse Practitioner

## 2022-08-12 VITALS — BP 120/60 | HR 68 | Temp 97.1°F | Ht 66.0 in | Wt 113.6 lb

## 2022-08-12 DIAGNOSIS — N1831 Chronic kidney disease, stage 3a: Secondary | ICD-10-CM

## 2022-08-12 DIAGNOSIS — G309 Alzheimer's disease, unspecified: Secondary | ICD-10-CM

## 2022-08-12 DIAGNOSIS — I10 Essential (primary) hypertension: Secondary | ICD-10-CM | POA: Diagnosis not present

## 2022-08-12 DIAGNOSIS — Z7901 Long term (current) use of anticoagulants: Secondary | ICD-10-CM

## 2022-08-12 DIAGNOSIS — Z23 Encounter for immunization: Secondary | ICD-10-CM | POA: Diagnosis not present

## 2022-08-12 DIAGNOSIS — F0280 Dementia in other diseases classified elsewhere without behavioral disturbance: Secondary | ICD-10-CM

## 2022-08-12 DIAGNOSIS — I482 Chronic atrial fibrillation, unspecified: Secondary | ICD-10-CM | POA: Diagnosis not present

## 2022-08-12 DIAGNOSIS — R54 Age-related physical debility: Secondary | ICD-10-CM

## 2022-08-12 NOTE — Progress Notes (Signed)
Subjective:  Patient ID: Lucas Kline, male    DOB: [birth year]  Age: 88 y.o. MRN: [id number]  CC: HTN HLD              HPI Pt presents for chronic follow-up of HTN, Hyperlipidemia, and COPD. Pt has diagnosis of dementia on EMR review. He is accompanied by his adult daughter. 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 Process to have DMV re-examination, states his driver's license is currently expired. Pt thin and fragile in appearance. Weight 113, BMI 18.34. Pt states he eats two meals daily.    Hypertension, follow-up: He was last seen for hypertension 7 months ago.  BP at that visit was 128/76. Management includes Verapamil 240 mg QD. He reports good compliance with treatment. He is not having side effects.  He is following a Regular diet. He is not exercising. He does not smoke.  Use of agents associated with hypertension: none.  Outside blood pressures are not being checked .  Pertinent labs: Lab Results  Component Value Date   CHOL 143 12/22/2021   HDL 58 12/22/2021   LDLCALC 72 12/22/2021   TRIG 61 12/22/2021   CHOLHDL 2.5 12/22/2021   Lab Results  Component Value Date   NA 141 06/03/2022   K 3.8 06/03/2022   CREATININE 1.26 06/03/2022   EGFR 54 (L) 06/03/2022   GFRNONAA 50 (L) 10/23/2020   GLUCOSE 106 (H) 06/03/2022     Lipid/Cholesterol, Follow-up  Last lipid panel Other pertinent labs  Lab Results  Component Value Date   CHOL 143 12/22/2021   HDL 58 12/22/2021   LDLCALC 72 12/22/2021   TRIG 61 12/22/2021   CHOLHDL 2.5 12/22/2021   Lab Results  Component Value Date   ALT 19 02/24/2022   AST 26 02/24/2022   PLT 192 06/03/2022   TSH 3.910 06/03/2022     He was last seen for this 7 months ago.  Management includes Zocor 20 mg QD. He reports good compliance with treatment. He is not having side effects.  Current diet: regular Current exercise: walking  Coronary artery disease, follow up  The patient was last seen  for CAD 08/31/21. He is not taking daily aspirin.Prescribed Eliquis 2.5 mg for a-fib.  He reports good compliance with treatment. He is not having side effects.  He is not having to take nitroglycerine. He is experiencing none. He is not experiencing none. He is not able to carry groceries,     is able to climb stairs,      is not able to cut grass,      is not able to work in the yard without having above symptoms.  His last vist with his cardiologist was unknown  Lipid Panel     Component Value Date/Time   CHOL 136 08/12/2022 1403   TRIG 60 08/12/2022 1403   HDL 63 08/12/2022 1403   CHOLHDL 2.2 08/12/2022 1403   LDLCALC 60 08/12/2022 7829   Last metabolic panel Lab Results  Component Value Date   GLUCOSE 82 08/12/2022   NA 141 08/12/2022   K 4.1 08/12/2022   CL 105 08/12/2022   CO2 22 08/12/2022   BUN 15 08/12/2022   CREATININE 1.15 08/12/2022   GFRNONAA 50 (L) 10/23/2020   GFRAA 58 (L) 10/23/2020   CALCIUM 9.1 08/12/2022   PROT 6.3 08/12/2022   ALBUMIN 4.1 08/12/2022   LABGLOB 2.2 08/12/2022   AGRATIO 1.9 08/12/2022   BILITOT 0.4 08/12/2022  ALKPHOS 64 08/12/2022   AST 16 08/12/2022   ALT 7 08/12/2022   ANIONGAP 9 02/12/2019        Current Outpatient Medications on File Prior to Visit  Medication Sig Dispense Refill   apixaban (ELIQUIS) 2.5 MG TABS tablet Take 1 tablet (2.5 mg total) by mouth 2 (two) times daily. 180 tablet 3   cyanocobalamin (,VITAMIN B-12,) 1000 MCG/ML injection INJECT 1 ML (1,000 MCG TOTAL) INTO THE MUSCLE EVERY 30 DAYS. 3 mL 6   levothyroxine (SYNTHROID) 50 MCG tablet TAKE 1 TABLET BY MOUTH EVERY DAY 90 tablet 2   Magnesium Hydroxide (DULCOLAX PO) Take 1 tablet by mouth daily as needed (constipation).     memantine (NAMENDA) 5 MG tablet TAKE 1 TABLET BY MOUTH TWICE A DAY 180 tablet 2   simvastatin (ZOCOR) 20 MG tablet TAKE 1 TABLET BY MOUTH EVERY DAY 90 tablet 3   verapamil (CALAN-SR) 240 MG CR tablet TAKE 1 TABLET BY MOUTH EVERY DAY  90 tablet 2   No current facility-administered medications on file prior to visit.   Past Medical History:  Diagnosis Date   Adult BMI <19 kg/sq m 01/21/2020   Alzheimer disease ([place])    Atherosclerotic heart disease of native coronary artery without angina pectoris    CHRONIC    Atrial fibrillation ([place]) 06/03/2022   B12 deficiency    CHRONIC   Bilateral carotid bruits 01/28/2020   Chronic atrial fibrillation ([place]) 01/21/2020   CKD (chronic kidney disease), stage II    MILD   CKD (chronic kidney disease), stage III (HCC)    MODERATE (CHRONIC)   Contusion of left knee 09/28/2021   Coronary atherosclerosis    CHRONIC   Essential hypertension 01/28/2020   Essential hypertension, benign    Essential tremor    CHRONIC   Malnutrition of moderate degree ([employer]) 01/21/2020   Memory deficit 09/08/2020   Mixed hearing loss, bilateral    CHRONIC   Mixed hyperlipidemia    CHRONIC    Neck pain 07/01/2021   Obstructive chronic bronchitis without exacerbation 01/21/2020   Osteoarthritis    OF KNEE CHRONIC   Personal history of malignant neoplasm of larynx 01/18/2020   Primary hypothyroidism 01/22/2020   Unstable gait 01/21/2020   Past Surgical History:  Procedure Laterality Date   CATARACT EXTRACTION, BILATERAL      Family History  Problem Relation Age of Onset   Hypertension Neg Hx    Heart disease Neg Hx    Cancer Neg Hx    Diabetes Neg Hx    Social History   Socioeconomic History   Marital status: Widowed    Spouse name: Not on file   Number of children: 2   Years of education: Not on file   Highest education level: Not on file  Occupational History   Occupation: retired   Occupation: Copywriter, advertising  Tobacco Use   Smoking status: Former    Types: Cigarettes    Quit date: 2008    Years since quitting: 15.9   Smokeless tobacco: Current    Types: Chew   Tobacco comments:    1 pouch sometimes  Vaping Use   Vaping Use: Never used  Substance and Sexual Activity    Alcohol use: No   Drug use: No   Sexual activity: Yes  Other Topics Concern   Not on file  Social History Narrative   Not on file   Social Determinants of Health   Financial Resource Strain: Unknown (06/29/2022)   Overall  Financial Resource Strain (CARDIA)    Difficulty of Paying Living Expenses: Patient refused  Food Insecurity: No Food Insecurity (09/08/2020)   Hunger Vital Sign    Worried About Running Out of Food in the Last Year: Never true    [place] in the Last Year: Never true  Transportation Needs: Unknown (06/29/2022)   PRAPARE - Transportation    Lack of Transportation (Medical): Patient refused    Lack of Transportation (Non-Medical): Patient refused  Physical Activity: Insufficiently Active (09/08/2020)   Exercise Vital Sign    Days of Exercise per Week: 2 days    Minutes of Exercise per Session: 60 min  Stress: No Stress Concern Present (09/08/2020)   [place]    Feeling of Stress : Not at all  Social Connections: Moderately Isolated (09/08/2020)   Social Connection and Isolation Panel [NHANES]    Frequency of Communication with Friends and Family: Once a week    Frequency of Social Gatherings with Friends and Family: More than three times a week    Attends Religious Services: Never    Marine scientist or Organizations: No    Attends Music therapist: More than 4 times per year    Marital Status: Widowed    Review of Systems  Unable to perform ROS: Dementia     Objective:  BP 120/60   Pulse 68   Temp (!) 97.1 F (36.2 C)   Ht _0  (1.676 m)   Wt 113 lb 9.6 oz (51.5 kg)   SpO2 96%   BMI 18.34 kg/m       07/23/2022    4:04 PM 06/03/2022    1:52 PM 03/10/2022    1:18 PM  BP/Weight  Systolic BP 831 517 616  Diastolic BP 56 58 54  Wt. (Lbs) 113.8 112 110  BMI 18.94 kg/m2 18.08 kg/m2 17.75 kg/m2    Physical Exam Vitals reviewed.  Constitutional:       Appearance: He is ill-appearing (frail).  HENT:     Mouth/Throat:     Mouth: Mucous membranes are dry.  Eyes:     Pupils: Pupils are equal, round, and reactive to light.     Comments: Glasses in place  Cardiovascular:     Rate and Rhythm: Normal rate. Rhythm irregular.  Pulmonary:     Effort: Pulmonary effort is normal.     Comments: Diminished lung sounds bilaterally Abdominal:     General: Bowel sounds are normal.     Palpations: Abdomen is soft.  Skin:    General: Skin is warm and dry.     Capillary Refill: Capillary refill takes less than 2 seconds.  Neurological:     Mental Status: He is alert. Mental status is at baseline.  Psychiatric:        Mood and Affect: Mood normal.         Lab Results  Component Value Date   WBC 5.9 06/03/2022   HGB 13.0 06/03/2022   HCT 38.0 06/03/2022   PLT 192 06/03/2022   GLUCOSE 106 (H) 06/03/2022   CHOL 143 12/22/2021   TRIG 61 12/22/2021   HDL 58 12/22/2021   LDLCALC 72 12/22/2021   ALT 19 02/24/2022   AST 26 02/24/2022   NA 141 06/03/2022   K 3.8 06/03/2022   CL 105 06/03/2022   CREATININE 1.26 06/03/2022   BUN 20 06/03/2022   CO2 21 06/03/2022   TSH 3.910 06/03/2022  Assessment & Plan:   1. Essential hypertension, benign-well controlled - CBC with Differential/Platelet - Comprehensive metabolic panel - Lipid panel -continue Verapamil 240 mg QD  2. Alzheimer disease (HCC)-stable - CBC with Differential/Platelet - Comprehensive metabolic panel  3. Age-related physical debility - CBC with Differential/Platelet - Comprehensive metabolic panel -assist with ADLs as needed  4. Chronic atrial fibrillation (HCC)-stable - CBC with Differential/Platelet - Comprehensive metabolic panel -continue Eliquis 2.5 mg QD -Continue Verapamil 240 mg QD  5. Stage 3a chronic kidney disease (HCC) - CBC with Differential/Platelet - Comprehensive metabolic panel  6. Long term current use of anticoagulant - CBC with  Differential/Platelet - Comprehensive metabolic panel -bleeding precautions  7. Need for pneumococcal 20-valent conjugate vaccination - Pneumococcal conjugate vaccine 20-valent (Prevnar 20)   We will call you with lab results Pneumonia-20 vaccine given in office today Follow-up in 6-month     Follow-up: 650-month fasting  An After Visit Summary was printed and given to the patient.  I, ShRip HarbourNP, have reviewed all documentation for this visit. The documentation on 08/12/22 for the exam, diagnosis, procedures, and orders are all accurate and complete.   ShRip HarbourNP [phone]

## 2022-08-12 NOTE — Patient Instructions (Addendum)
We will call you with lab results Pneumonia-20 vaccine given in office today Follow-up in 97-month   Pneumococcal Conjugate Vaccine: What You Need to Know 1. Why get vaccinated? Pneumococcal conjugate vaccine can prevent pneumococcal disease. Pneumococcal disease refers to any illness caused by pneumococcal bacteria. These bacteria can cause many types of illnesses, including pneumonia, which is an infection of the lungs. Pneumococcal bacteria are one of the most common causes of pneumonia. Besides pneumonia, pneumococcal bacteria can also cause: Ear infections Sinus infections Meningitis (infection of the tissue covering the brain and spinal cord) Bacteremia (infection of the blood) Anyone can get pneumococcal disease, but children under 237years old, people with certain medical conditions or other risk factors, and adults 674years or older are at the highest risk. Most pneumococcal infections are mild. However, some can result in long-term problems, such as brain damage or hearing loss. Meningitis, bacteremia, and pneumonia caused by pneumococcal disease can be fatal. 2. Pneumococcal conjugate vaccine Pneumococcal conjugate vaccine helps protect against bacteria that cause pneumococcal disease. There are three pneumococcal conjugate vaccines (PCV13, PCV15, and PCV20). The different vaccines are recommended for different people based on age and medical status. Your health care provider can help you determine which type of pneumococcal conjugate vaccine, and how many doses, you should receive. Infants and young children usually need 4 doses of pneumococcal conjugate vaccine. These doses are recommended at 2, 4, 6, and 163160months of age. Older children and adolescents might need pneumococcal conjugate vaccine depending on their age and medical conditions or other risk factors if they did not receive the recommended doses as infants or young children. Adults 155through 678years old with certain  medical conditions or other risk factors who have not already received pneumococcal conjugate vaccine should receive pneumococcal conjugate vaccine. Adults 65 years or older who have not previously received pneumococcal conjugate vaccine should receive pneumococcal conjugate vaccine. Some people with certain medical conditions are also recommended to receive pneumococcal polysaccharide vaccine (a different type of pneumococcal vaccine known as PPSV23). Some adults who have previously received a pneumococcal conjugate vaccine may be recommended to receive another pneumococcal conjugate vaccine. 3. Talk with your health care provider Tell your vaccination provider if the person getting the vaccine: Has had an allergic reaction after a previous dose of any type of pneumococcal conjugate vaccine (PCV13, PCV15, PCV20, or an earlier pneumococcal conjugate vaccine known as PCV7), or to any vaccine containing diphtheria toxoid (for example, DTaP), or has any severe, life-threatening allergies In some cases, your health care provider may decide to postpone pneumococcal conjugate vaccination until a future visit. People with minor illnesses, such as a cold, may be vaccinated. People who are moderately or severely ill should usually wait until they recover. Your health care provider can give you more information. 4. Risks of a vaccine reaction Redness, swelling, pain, or tenderness where the shot is given, and fever, loss of appetite, fussiness (irritability), feeling tired, headache, muscle aches, joint pain, and chills can happen after pneumococcal conjugate vaccination. Young children may be at increased risk for seizures caused by fever after a pneumococcal conjugate vaccine if it is administered at the same time as inactivated influenza vaccine. Ask your health care provider for more information. People sometimes faint after medical procedures, including vaccination. Tell your provider if you feel dizzy or  have vision changes or ringing in the ears. As with any medicine, there is a very remote chance of a vaccine causing a severe allergic reaction, other  serious injury, or death. 5. What if there is a serious problem? An allergic reaction could occur after the vaccinated person leaves the clinic. If you see signs of a severe allergic reaction (hives, swelling of the face and throat, difficulty breathing, a fast heartbeat, dizziness, or weakness), call 9-1-1 and get the person to the nearest hospital. For other signs that concern you, call your health care provider. Adverse reactions should be reported to the Vaccine Adverse Event Reporting System (VAERS). Your health care provider will usually file this report, or you can do it yourself. Visit the VAERS website at www.vaers.SamedayNews.es or call 339 837 9426. VAERS is only for reporting reactions, and VAERS staff members do not give medical advice. 6. The National Vaccine Injury Compensation Program The [employer] Vaccine Injury Compensation Program (VICP) is a federal program that was created to compensate people who may have been injured by certain vaccines. Claims regarding alleged injury or death due to vaccination have a time limit for filing, which may be as short as two years. Visit the VICP website at GoldCloset.com.ee or call 240-833-1538 to learn about the program and about filing a claim. 7. How can I learn more? Ask your health care provider. Call your local or state health department. Visit the website of the Food and Drug Administration (FDA) for vaccine package inserts and additional information at TraderRating.uy. Contact the Centers for Disease Control and Prevention (CDC): Call 786-372-0803 (1-800-CDC-INFO) or Visit CDC's website at http://hunter.com/. Source: CDC Vaccine Information Statement (Interim) Pneumococcal Conjugate Vaccine (01/22/2022) This same material is available at  http://www.wolf.info/ for no charge. This information is not intended to replace advice given to you by your health care provider. Make sure you discuss any questions you have with your health care provider. Document Revised: 03/12/2022 Document Reviewed: 03/12/2022 Elsevier Patient Education  [place] 65 Years and Older, Male Preventive care refers to lifestyle choices and visits with your health care provider that can promote health and wellness. Preventive care visits are also called wellness exams. What can I expect for my preventive care visit? Counseling During your preventive care visit, your health care provider may ask about your: Medical history, including: Past medical problems. Family medical history. History of falls. Current health, including: Emotional well-being. Home life and relationship well-being. Sexual activity. Memory and ability to understand (cognition). Lifestyle, including: Alcohol, nicotine or tobacco, and drug use. Access to firearms. Diet, exercise, and sleep habits. Work and work Statistician. Sunscreen use. Safety issues such as seatbelt and bike helmet use. Physical exam Your health care provider will check your: Height and weight. These may be used to calculate your BMI (body mass index). BMI is a measurement that tells if you are at a healthy weight. Waist circumference. This measures the distance around your waistline. This measurement also tells if you are at a healthy weight and may help predict your risk of certain diseases, such as type 2 diabetes and high blood pressure. Heart rate and blood pressure. Body temperature. Skin for abnormal spots. What immunizations do I need?  Vaccines are usually given at various ages, according to a schedule. Your health care provider will recommend vaccines for you based on your age, medical history, and lifestyle or other factors, such as travel or where you work. What tests do I  need? Screening Your health care provider may recommend screening tests for certain conditions. This may include: Lipid and cholesterol levels. Diabetes screening. This is done by checking your blood sugar (glucose) after you  have not eaten for a while (fasting). Hepatitis C test. Hepatitis B test. HIV (human immunodeficiency virus) test. STI (sexually transmitted infection) testing, if you are at risk. Lung cancer screening. Colorectal cancer screening. Prostate cancer screening. Abdominal aortic aneurysm (AAA) screening. You may need this if you are a current or former smoker. Talk with your health care provider about your test results, treatment options, and if necessary, the need for more tests. Follow these instructions at home: Eating and drinking  Eat a diet that includes fresh fruits and vegetables, whole grains, lean protein, and low-fat dairy products. Limit your intake of foods with high amounts of sugar, saturated fats, and salt. Take vitamin and mineral supplements as recommended by your health care provider. Do not drink alcohol if your health care provider tells you not to drink. If you drink alcohol: Limit how much you have to 0-2 drinks a day. Know how much alcohol is in your drink. In the [place] one drink equals one 12 oz bottle of beer (355 mL), one 5 oz glass of wine (148 mL), or one 1 oz glass of hard liquor (44 mL). Lifestyle Brush your teeth every morning and night with fluoride toothpaste. Floss one time each day. Exercise for at least 30 minutes 5 or more days each week. Do not use any products that contain nicotine or tobacco. These products include cigarettes, chewing tobacco, and vaping devices, such as e-cigarettes. If you need help quitting, ask your health care provider. Do not use drugs. If you are sexually active, practice safe sex. Use a condom or other form of protection to prevent STIs. Take aspirin only as told by your health care provider. Make sure  that you understand how much to take and what form to take. Work with your health care provider to find out whether it is safe and beneficial for you to take aspirin daily. Ask your health care provider if you need to take a cholesterol-lowering medicine (statin). Find healthy ways to manage stress, such as: Meditation, yoga, or listening to music. Journaling. Talking to a trusted person. Spending time with friends and family. Safety Always wear your seat belt while driving or riding in a vehicle. Do not drive: If you have been drinking alcohol. Do not ride with someone who has been drinking. When you are tired or distracted. While texting. If you have been using any mind-altering substances or drugs. Wear a helmet and other protective equipment during sports activities. If you have firearms in your house, make sure you follow all gun safety procedures. Minimize exposure to UV radiation to reduce your risk of skin cancer. What's next? Visit your health care provider once a year for an annual wellness visit. Ask your health care provider how often you should have your eyes and teeth checked. Stay up to date on all vaccines. This information is not intended to replace advice given to you by your health care provider. Make sure you discuss any questions you have with your health care provider. Document Revised: 02/25/2021 Document Reviewed: 02/25/2021 Elsevier Patient Education  [place].

## 2022-08-13 LAB — COMPREHENSIVE METABOLIC PANEL
ALT: 7 IU/L (ref 0–44)
AST: 16 IU/L (ref 0–40)
Albumin/Globulin Ratio: 1.9 (ref 1.2–2.2)
Albumin: 4.1 g/dL (ref 3.6–4.6)
Alkaline Phosphatase: 64 IU/L (ref 44–121)
BUN/Creatinine Ratio: 13 (ref 10–24)
BUN: 15 mg/dL (ref 10–36)
Bilirubin Total: 0.4 mg/dL (ref 0.0–1.2)
CO2: 22 mmol/L (ref 20–29)
Calcium: 9.1 mg/dL (ref 8.6–10.2)
Chloride: 105 mmol/L (ref 96–106)
Creatinine, Ser: 1.15 mg/dL (ref 0.76–1.27)
Globulin, Total: 2.2 g/dL (ref 1.5–4.5)
Glucose: 82 mg/dL (ref 70–99)
Potassium: 4.1 mmol/L (ref 3.5–5.2)
Sodium: 141 mmol/L (ref 134–144)
Total Protein: 6.3 g/dL (ref 6.0–8.5)
eGFR: 60 mL/min/{1.73_m2} (ref >59)

## 2022-08-13 LAB — LIPID PANEL
Chol/HDL Ratio: 2.2 ratio (ref 0.0–5.0)
Cholesterol, Total: 136 mg/dL (ref 100–199)
HDL: 63 mg/dL (ref >39)
LDL Chol Calc (NIH): 60 mg/dL (ref 0–99)
Triglycerides: 60 mg/dL (ref 0–149)
VLDL Cholesterol Cal: 13 mg/dL (ref 5–40)

## 2022-08-13 LAB — CBC WITH DIFFERENTIAL/PLATELET
Basophils Absolute: 0 10*3/uL (ref 0.0–0.2)
Basos: 1 %
EOS (ABSOLUTE): 0.1 10*3/uL (ref 0.0–0.4)
Eos: 3 %
Hematocrit: 35.2 % — ABNORMAL LOW (ref 37.5–51.0)
Hemoglobin: 12.2 g/dL — ABNORMAL LOW (ref 13.0–17.7)
Immature Grans (Abs): 0 10*3/uL (ref 0.0–0.1)
Immature Granulocytes: 0 %
Lymphocytes Absolute: 1.2 10*3/uL (ref 0.7–3.1)
Lymphs: 25 %
MCH: 32.4 pg (ref 26.6–33.0)
MCHC: 34.7 g/dL (ref 31.5–35.7)
MCV: 94 fL (ref 79–97)
Monocytes Absolute: 0.5 10*3/uL (ref 0.1–0.9)
Monocytes: 11 %
Neutrophils Absolute: 3 10*3/uL (ref 1.4–7.0)
Neutrophils: 60 %
Platelets: 168 10*3/uL (ref 150–450)
RBC: 3.76 x10E6/uL — ABNORMAL LOW (ref 4.14–5.80)
RDW: 12.4 % (ref 11.6–15.4)
WBC: 4.9 10*3/uL (ref 3.4–10.8)

## 2022-08-13 LAB — CARDIOVASCULAR RISK ASSESSMENT

## 2022-09-02 NOTE — Progress Notes (Signed)
Acute Office Visit  Subjective:    Patient ID: Lucas Kline, male    DOB: 03/19/1929, 88 y.o.   MRN: [id number]  Chief Complaint  Patient presents with   URI    HPI: Patient is in today for Upper respiratory symptoms. He is accompanied by his adult daughter that helps with his care. Pt has dementia. Daughter states he has difficulty using inhalers due to decreased dexterity of hands. She has requested an aerosol chamber to assist with medication administration. He complains of non productive cough with mild dyspnea. Denies fever, chills, or body aches. Onset of symptoms was a few days ago and staying constant.He has had very poor oral intake, states, "I'm not drinking enough water". BMI 18.24.  Past history is significant for COPD and aspiration pneumonia.  Patient is former smoker.   Past Medical History:  Diagnosis Date   Adult BMI <19 kg/sq m 01/21/2020   Alzheimer disease ([place])    Atherosclerotic heart disease of native coronary artery without angina pectoris    CHRONIC    Atrial fibrillation ([place]) 06/03/2022   B12 deficiency    CHRONIC   Bilateral carotid bruits 01/28/2020   Chronic atrial fibrillation ([place]) 01/21/2020   CKD (chronic kidney disease), stage II    MILD   CKD (chronic kidney disease), stage III (HCC)    MODERATE (CHRONIC)   Contusion of left knee 09/28/2021   Coronary atherosclerosis    CHRONIC   Essential hypertension 01/28/2020   Essential hypertension, benign    Essential tremor    CHRONIC   Malnutrition of moderate degree ([place]) 01/21/2020   Memory deficit 09/08/2020   Mixed hearing loss, bilateral    CHRONIC   Mixed hyperlipidemia    CHRONIC    Neck pain 07/01/2021   Obstructive chronic bronchitis without exacerbation 01/21/2020   Osteoarthritis    OF KNEE CHRONIC   Personal history of malignant neoplasm of larynx 01/18/2020   Primary hypothyroidism 01/22/2020   Unstable gait 01/21/2020    Past Surgical History:  Procedure  Laterality Date   CATARACT EXTRACTION, BILATERAL      Family History  Problem Relation Age of Onset   Hypertension Neg Hx    Heart disease Neg Hx    Cancer Neg Hx    Diabetes Neg Hx     Social History   Socioeconomic History   Marital status: Widowed    Spouse name: Not on file   Number of children: 2   Years of education: Not on file   Highest education level: Not on file  Occupational History   Occupation: retired   Occupation: Copywriter, advertising  Tobacco Use   Smoking status: Former    Types: Cigarettes    Quit date: 2008    Years since quitting: 15.9   Smokeless tobacco: Current    Types: Chew   Tobacco comments:    1 pouch sometimes  Vaping Use   Vaping Use: Never used  Substance and Sexual Activity   Alcohol use: No   Drug use: No   Sexual activity: Yes  Other Topics Concern   Not on file  Social History Narrative   Not on file   Social Determinants of Health   Financial Resource Strain: Unknown (06/29/2022)   Overall Financial Resource Strain (CARDIA)    Difficulty of Paying Living Expenses: Patient refused  Food Insecurity: No Food Insecurity (09/08/2020)   Hunger Vital Sign    Worried About [place] in the Last Year:  Never true    Ran Out of Food in the Last Year: Never true  Transportation Needs: Unknown (06/29/2022)   PRAPARE - Transportation    Lack of Transportation (Medical): Patient refused    Lack of Transportation (Non-Medical): Patient refused  Physical Activity: Insufficiently Active (09/08/2020)   Exercise Vital Sign    Days of Exercise per Week: 2 days    Minutes of Exercise per Session: 60 min  Stress: No Stress Concern Present (09/08/2020)   [place]    Feeling of Stress : Not at all  Social Connections: Moderately Isolated (09/08/2020)   Social Connection and Isolation Panel [NHANES]    Frequency of Communication with Friends and Family: Once a week     Frequency of Social Gatherings with Friends and Family: More than three times a week    Attends Religious Services: Never    Marine scientist or Organizations: No    Attends Music therapist: More than 4 times per year    Marital Status: Widowed  Intimate Partner Violence: Not At Risk (09/08/2020)   Humiliation, Afraid, Rape, and Kick questionnaire    Fear of Current or Ex-Partner: No    Emotionally Abused: No    Physically Abused: No    Sexually Abused: No    Outpatient Medications Prior to Visit  Medication Sig Dispense Refill   apixaban (ELIQUIS) 2.5 MG TABS tablet Take 1 tablet (2.5 mg total) by mouth 2 (two) times daily. 180 tablet 3   cyanocobalamin (,VITAMIN B-12,) 1000 MCG/ML injection INJECT 1 ML (1,000 MCG TOTAL) INTO THE MUSCLE EVERY 30 DAYS. 3 mL 6   levothyroxine (SYNTHROID) 50 MCG tablet TAKE 1 TABLET BY MOUTH EVERY DAY 90 tablet 2   Magnesium Hydroxide (DULCOLAX PO) Take 1 tablet by mouth daily as needed (constipation).     memantine (NAMENDA) 5 MG tablet TAKE 1 TABLET BY MOUTH TWICE A DAY 180 tablet 2   simvastatin (ZOCOR) 20 MG tablet TAKE 1 TABLET BY MOUTH EVERY DAY 90 tablet 3   verapamil (CALAN-SR) 240 MG CR tablet TAKE 1 TABLET BY MOUTH EVERY DAY 90 tablet 2   No facility-administered medications prior to visit.    Allergies  Allergen Reactions   Aricept [Donepezil] Other (See Comments)    Abdominal pain, diarrhea    Review of Systems  Unable to perform ROS: Dementia       Objective:    Physical Exam Vitals reviewed.  Constitutional:      Appearance: Normal appearance. He is normal weight.  Cardiovascular:     Rate and Rhythm: Normal rate and regular rhythm.     Heart sounds: Normal heart sounds.  Pulmonary:     Effort: Pulmonary effort is normal.     Breath sounds: Normal breath sounds.  Abdominal:     General: Abdomen is flat. Bowel sounds are normal.     Palpations: Abdomen is soft.  Neurological:     Mental Status: He is  alert and oriented to person, place, and time.  Psychiatric:        Mood and Affect: Mood normal.        Behavior: Behavior normal.     BP (!) 92/58 (BP Location: Left Arm, Patient Position: Sitting)   Pulse 79   Temp 97.8 F (36.6 C) (Temporal)   Ht _0  (1.676 m)   Wt 113 lb (51.3 kg)   SpO2 93%   BMI 18.24 kg/m  Wt  Readings from Last 3 Encounters:  09/03/22 113 lb (51.3 kg)  08/12/22 113 lb 9.6 oz (51.5 kg)  07/23/22 113 lb 12.8 oz (51.6 kg)    Health Maintenance Due  Topic Date Due   COVID-19 Vaccine (1) Never done   Medicare Annual Wellness (AWV)  09/08/2021   DTaP/Tdap/Td (2 - Td or Tdap) 12/28/2021   INFLUENZA VACCINE  04/13/2022       Lab Results  Component Value Date   TSH 3.910 06/03/2022   Lab Results  Component Value Date   WBC 4.9 08/12/2022   HGB 12.2 (L) 08/12/2022   HCT 35.2 (L) 08/12/2022   MCV 94 08/12/2022   PLT 168 08/12/2022   Lab Results  Component Value Date   NA 141 08/12/2022   K 4.1 08/12/2022   CO2 22 08/12/2022   GLUCOSE 82 08/12/2022   BUN 15 08/12/2022   CREATININE 1.15 08/12/2022   BILITOT 0.4 08/12/2022   ALKPHOS 64 08/12/2022   AST 16 08/12/2022   ALT 7 08/12/2022   PROT 6.3 08/12/2022   ALBUMIN 4.1 08/12/2022   CALCIUM 9.1 08/12/2022   ANIONGAP 9 02/12/2019   EGFR 60 08/12/2022   Lab Results  Component Value Date   CHOL 136 08/12/2022   Lab Results  Component Value Date   HDL 63 08/12/2022   Lab Results  Component Value Date   LDLCALC 60 08/12/2022   Lab Results  Component Value Date   TRIG 60 08/12/2022   Lab Results  Component Value Date   CHOLHDL 2.2 08/12/2022        Assessment & Plan:   1. COPD exacerbation (HCC) - azithromycin (ZITHROMAX) 250 MG tablet; Take 2 tablets on day 1, then 1 tablet daily on days 2 through 5  Dispense: 6 tablet; Refill: 0 - Spacer/Aero-Holding Chambers (AEROCHAMBER HOLDING CHAMBER) DEVI; 1 each by Does not apply route once for 1 dose.  Dispense: 1 each; Refill:  1 - benzonatate (TESSALON) 100 MG capsule; Take 1 capsule (100 mg total) by mouth 2 (two) times daily as needed for cough.  Dispense: 20 capsule; Refill: 0  2. Acute upper respiratory infection - POC COVID-19 BinaxNow - POCT Influenza A/B    Take Z-pack as directed Tessalon Perles as needed for cough Rest and push fluids Take Mucinex every 12 hours Follow-up as needed      Follow-up: PRN  An After Visit Summary was printed and given to the patient.   I,Lauren M Auman,acting as a Education administrator for [employer], NP.,have documented all relevant documentation on the behalf of Rip Harbour, NP,as directed by  Rip Harbour, NP while in the presence of Rip Harbour, NP.   I, Rip Harbour, NP, have reviewed all documentation for this visit. The documentation on 09/03/22 for the exam, diagnosis, procedures, and orders are all accurate and complete.    Signed, Rip Harbour, NP [place] [phone]

## 2022-09-03 ENCOUNTER — Ambulatory Visit (INDEPENDENT_AMBULATORY_CARE_PROVIDER_SITE_OTHER): Payer: Medicare Other | Admitting: Nurse Practitioner

## 2022-09-03 ENCOUNTER — Encounter: Payer: Self-pay | Admitting: Nurse Practitioner

## 2022-09-03 VITALS — BP 92/58 | HR 79 | Temp 97.8°F | Ht 66.0 in | Wt 113.0 lb

## 2022-09-03 DIAGNOSIS — J069 Acute upper respiratory infection, unspecified: Secondary | ICD-10-CM | POA: Diagnosis not present

## 2022-09-03 DIAGNOSIS — J441 Chronic obstructive pulmonary disease with (acute) exacerbation: Secondary | ICD-10-CM | POA: Diagnosis not present

## 2022-09-03 LAB — POCT INFLUENZA A/B
Influenza A, POC: NEGATIVE
Influenza B, POC: NEGATIVE

## 2022-09-03 LAB — POC COVID19 BINAXNOW: SARS Coronavirus 2 Ag: NEGATIVE

## 2022-09-03 MED ORDER — AEROCHAMBER HOLDING CHAMBER DEVI: 1.0000 | Freq: Once | 1 refills | Status: DC

## 2022-09-03 MED ORDER — BENZONATATE 100 MG PO CAPS: 100.0000 mg | ORAL_CAPSULE | Freq: Two times a day (BID) | ORAL | 0 refills | Status: DC | PRN

## 2022-09-03 MED ORDER — AZITHROMYCIN 250 MG PO TABS: ORAL_TABLET | ORAL | 0 refills | Status: AC

## 2022-09-03 NOTE — Patient Instructions (Addendum)
Take Z-pack as directed Tessalon Perles as needed for cough Rest and push fluids Take Mucinex every 12 hours Follow-up as needed   Chronic Obstructive Pulmonary Disease Exacerbation  Chronic obstructive pulmonary disease (COPD) is a long-term (chronic) lung problem. In COPD, the flow of air from the lungs is limited. COPD exacerbations are times that breathing gets worse and you need more than your normal treatment. Without treatment, they can be life-threatening. If they happen often, your lungs can become more damaged. What are the causes? Having infections that affect your airways and lungs. Being exposed to: Smoke. Air pollution. Chemical fumes. Dust. Things that can cause an allergic reaction (allergens). Not taking your usual COPD medicines as told. Having medical problems already, such as heart failure or infections not involving the lungs. In many cases, the cause is not known. What increases the risk? Smoking. Being an older adult. Having frequent prior COPD exacerbations. What are the signs or symptoms? Increased coughing. Increased mucus from your lungs. Increased wheezing. Increased shortness of breath. Fast breathing and finding it hard to breathe. Chest tightness. Less energy than usual. Sleep disruption from symptoms. Confusion. Increased sleepiness. Often, these symptoms happen or get worse even with the use of medicines. How is this treated? Treatment for this condition depends on how bad it is and the cause of the symptoms. You may need to stay in the hospital for treatment. Treatment may include: Taking medicines. Using oxygen. Being treated with different ways to clear your airway, such as using a mask to deliver oxygen. Follow these instructions at home: Medicines Take over-the-counter and prescription medicines only as told by your doctor. Use all inhaled medicines the correct way. If you were prescribed an antibiotic or steroid medicine, take it  as told by your doctor. Do not stop taking it even if you start to feel better. Lifestyle Do not smoke or use any products that contain nicotine or tobacco. If you need help quitting, ask your doctor. Eat healthy foods. Exercise regularly. Get enough sleep. Most adults need 7 or more hours per night. Avoid tobacco smoke and other things that can bother your lungs. Several times a day, wash your hands with soap and water for at least 20 seconds. If you cannot use soap and water, use hand sanitizer. This may help keep you from getting an infection. During flu season, avoid areas that are crowded with people. General instructions Drink enough fluid to keep your pee (urine) pale yellow. Do not do this if your doctor has told you not to. Use a cool mist machine (vaporizer). If you use oxygen or a machine that turns medicine into a mist (nebulizer), continue to use it as told. Keep all follow-up visits. How is this prevented? Keep up with shots (vaccinations) as told by your doctor. Be sure to get a yearly flu (influenza) shot. If you smoke, quit smoking. Smoking makes the problem worse. Follow all instructions for rehabilitation. These are steps you can take to make your body work better. Work with your doctor to develop and follow an action plan. This tells you what steps to take when you experience certain symptoms. Contact a doctor if: Your COPD symptoms get worse than normal. Get help right away if: You are short of breath and it gets worse, even when you are resting. You have trouble talking. You have chest pain. You cough up blood. You have a fever. You keep vomiting. You feel weak or you pass out (faint). You feel confused. You are  not able to sleep because of your symptoms. You have trouble doing daily activities. These symptoms may be an emergency. Get help right away. Call your local emergency services (911 in the [place] Do not wait to see if the symptoms will go away. Do not  drive yourself to the hospital. Summary COPD exacerbations are times that breathing gets worse and you need more treatment than normal. COPD exacerbations can be very serious and may cause your lungs to become more damaged. Do not smoke. If you need help quitting, ask your doctor. Stay up to date on your shots. Get a flu shot every year. This information is not intended to replace advice given to you by your health care provider. Make sure you discuss any questions you have with your health care provider. Document Revised: 07/23/2020 Document Reviewed: 07/08/2020 Elsevier Patient Education  [place].

## 2022-09-05 DIAGNOSIS — R918 Other nonspecific abnormal finding of lung field: Secondary | ICD-10-CM | POA: Diagnosis not present

## 2022-09-05 DIAGNOSIS — I4891 Unspecified atrial fibrillation: Secondary | ICD-10-CM | POA: Diagnosis not present

## 2022-09-05 DIAGNOSIS — R079 Chest pain, unspecified: Secondary | ICD-10-CM | POA: Diagnosis not present

## 2022-09-05 DIAGNOSIS — J449 Chronic obstructive pulmonary disease, unspecified: Secondary | ICD-10-CM | POA: Diagnosis not present

## 2022-09-05 DIAGNOSIS — I2510 Atherosclerotic heart disease of native coronary artery without angina pectoris: Secondary | ICD-10-CM | POA: Diagnosis not present

## 2022-09-05 DIAGNOSIS — Z20822 Contact with and (suspected) exposure to covid-19: Secondary | ICD-10-CM | POA: Diagnosis not present

## 2022-09-05 DIAGNOSIS — S41111A Laceration without foreign body of right upper arm, initial encounter: Secondary | ICD-10-CM | POA: Diagnosis not present

## 2022-09-05 DIAGNOSIS — R0781 Pleurodynia: Secondary | ICD-10-CM | POA: Diagnosis not present

## 2022-09-05 DIAGNOSIS — Z792 Long term (current) use of antibiotics: Secondary | ICD-10-CM | POA: Diagnosis not present

## 2022-09-05 DIAGNOSIS — Z79899 Other long term (current) drug therapy: Secondary | ICD-10-CM | POA: Diagnosis not present

## 2022-09-05 DIAGNOSIS — Z7901 Long term (current) use of anticoagulants: Secondary | ICD-10-CM | POA: Diagnosis not present

## 2022-09-05 DIAGNOSIS — I10 Essential (primary) hypertension: Secondary | ICD-10-CM | POA: Diagnosis not present

## 2022-09-05 DIAGNOSIS — R0789 Other chest pain: Secondary | ICD-10-CM | POA: Diagnosis not present

## 2022-09-05 DIAGNOSIS — M25551 Pain in right hip: Secondary | ICD-10-CM | POA: Diagnosis not present

## 2022-09-05 DIAGNOSIS — F17220 Nicotine dependence, chewing tobacco, uncomplicated: Secondary | ICD-10-CM | POA: Diagnosis not present

## 2022-09-05 DIAGNOSIS — G309 Alzheimer's disease, unspecified: Secondary | ICD-10-CM | POA: Diagnosis not present

## 2022-09-10 ENCOUNTER — Telehealth: Payer: Self-pay

## 2022-09-10 DIAGNOSIS — Z79899 Other long term (current) drug therapy: Secondary | ICD-10-CM | POA: Diagnosis not present

## 2022-09-10 DIAGNOSIS — G309 Alzheimer's disease, unspecified: Secondary | ICD-10-CM | POA: Diagnosis not present

## 2022-09-10 DIAGNOSIS — I4891 Unspecified atrial fibrillation: Secondary | ICD-10-CM | POA: Diagnosis not present

## 2022-09-10 DIAGNOSIS — Z1152 Encounter for screening for COVID-19: Secondary | ICD-10-CM | POA: Diagnosis not present

## 2022-09-10 DIAGNOSIS — J441 Chronic obstructive pulmonary disease with (acute) exacerbation: Secondary | ICD-10-CM | POA: Diagnosis not present

## 2022-09-10 DIAGNOSIS — R82998 Other abnormal findings in urine: Secondary | ICD-10-CM | POA: Diagnosis not present

## 2022-09-10 DIAGNOSIS — I10 Essential (primary) hypertension: Secondary | ICD-10-CM | POA: Diagnosis not present

## 2022-09-10 DIAGNOSIS — I2510 Atherosclerotic heart disease of native coronary artery without angina pectoris: Secondary | ICD-10-CM | POA: Diagnosis not present

## 2022-09-10 DIAGNOSIS — R0981 Nasal congestion: Secondary | ICD-10-CM | POA: Diagnosis not present

## 2022-09-10 DIAGNOSIS — Z7901 Long term (current) use of anticoagulants: Secondary | ICD-10-CM | POA: Diagnosis not present

## 2022-09-10 DIAGNOSIS — Z20822 Contact with and (suspected) exposure to covid-19: Secondary | ICD-10-CM | POA: Diagnosis not present

## 2022-09-10 DIAGNOSIS — I491 Atrial premature depolarization: Secondary | ICD-10-CM | POA: Diagnosis not present

## 2022-09-10 DIAGNOSIS — R410 Disorientation, unspecified: Secondary | ICD-10-CM | POA: Diagnosis not present

## 2022-09-10 DIAGNOSIS — B9789 Other viral agents as the cause of diseases classified elsewhere: Secondary | ICD-10-CM | POA: Diagnosis not present

## 2022-09-10 DIAGNOSIS — R918 Other nonspecific abnormal finding of lung field: Secondary | ICD-10-CM | POA: Diagnosis not present

## 2022-09-10 DIAGNOSIS — Z888 Allergy status to other drugs, medicaments and biological substances status: Secondary | ICD-10-CM | POA: Diagnosis not present

## 2022-09-10 DIAGNOSIS — R059 Cough, unspecified: Secondary | ICD-10-CM | POA: Diagnosis not present

## 2022-09-10 NOTE — Telephone Encounter (Signed)
Patient is very confused and has a bad cough. Patient was seen at our office on Friday and went to the emergency department in [place] on Sunday. I spoke with Larene Beach, NP who recommended to take him back to the emergency department due to his medical HX and for the emergency department to check a UA on him. Anderson Malta was notified.

## 2022-09-14 DIAGNOSIS — J441 Chronic obstructive pulmonary disease with (acute) exacerbation: Secondary | ICD-10-CM | POA: Diagnosis not present

## 2022-10-03 ENCOUNTER — Other Ambulatory Visit: Payer: Self-pay | Admitting: Cardiology

## 2022-10-03 DIAGNOSIS — E782 Mixed hyperlipidemia: Secondary | ICD-10-CM

## 2022-10-25 ENCOUNTER — Other Ambulatory Visit: Payer: Self-pay

## 2022-10-25 DIAGNOSIS — I4891 Unspecified atrial fibrillation: Secondary | ICD-10-CM

## 2022-10-25 MED ORDER — APIXABAN 2.5 MG PO TABS: 2.5000 mg | ORAL_TABLET | Freq: Two times a day (BID) | ORAL | 3 refills | Status: DC

## 2022-10-26 ENCOUNTER — Other Ambulatory Visit: Payer: Self-pay

## 2022-10-26 MED ORDER — VERAPAMIL HCL ER 240 MG PO TBCR: 240.0000 mg | EXTENDED_RELEASE_TABLET | Freq: Every day | ORAL | 0 refills | Status: DC

## 2022-11-03 ENCOUNTER — Other Ambulatory Visit: Payer: Self-pay | Admitting: Family Medicine

## 2022-11-03 DIAGNOSIS — E039 Hypothyroidism, unspecified: Secondary | ICD-10-CM

## 2022-11-04 ENCOUNTER — Telehealth: Payer: Self-pay

## 2022-11-04 NOTE — Telephone Encounter (Signed)
Patient daughter called and stated that patient fell yesterday, he don't remember anything but somebody helping him up off the side of the road because he was walking his dog when he fell. He stated he has a little head.  Per Dr.Cox: patient should go to the nearest ED due to he need a scan on his head.

## 2023-01-25 ENCOUNTER — Other Ambulatory Visit: Payer: Self-pay | Admitting: Family Medicine

## 2023-02-24 ENCOUNTER — Ambulatory Visit: Payer: Medicare Other | Admitting: Physician Assistant

## 2023-03-10 ENCOUNTER — Ambulatory Visit: Payer: Medicare Other | Admitting: Physician Assistant

## 2023-03-14 ENCOUNTER — Telehealth: Payer: Self-pay

## 2023-03-14 NOTE — Telephone Encounter (Signed)
Called patient to schedule Medicare Annual Wellness Visit (AWV). Left message for patient to call back and schedule some appointments.    Also, he needs to reschedule his chronic f.up fasting appointment that was canceled. These appts can not be at the same time. Please reschedule his chronic appointment and then his awv.

## 2023-04-03 NOTE — Progress Notes (Unsigned)
Subjective:  Patient ID: Lucas Kline, male    DOB: Mar 14, 1929  Age: 89 y.o. MRN: [id number]  Chief Complaint  Patient presents with   Medical Management of Chronic Issues    HPI   Pt presents for chronic follow-up of HTN, Hyperlipidemia, and COPD. Pt has diagnosis of dementia on EMR review. He is accompanied by his adult daughter. Daughter had questions about his Namenda and if that could be increased. Side effects and concerns were discussed. Patient also wanted to discuss his weight and dehydration. States patient does not like drinking anything besides his coffee. Did have signs of dehydration during the recent heat wave, but was able to hydrate and felt better. Patient does not like Liquid IV, water, Gatorade, or power aide. Discussed using Pedialyte as an alternative to help with dehydration. Along with finding a protein shake the patient will currently like to drink to try and help him increase his weight. Does not like boost or ensure drinks.    Presented with a tick in the dorsal side of his left wrist. Noticed it today while waiting in the office.   Hypertension, follow-up: He was last seen for hypertension 7 months ago.  BP at that visit was 128/76. Management includes Verapamil 240 mg QD. He reports good compliance with treatment. He is not having side effects.  He is following a Regular diet. He is not exercising. He does not smoke.  Use of agents associated with hypertension: none.  Outside blood pressures are not being checked .  Lipid/Cholesterol, Follow-up  He was last seen for this 7 months ago.  Management includes Zocor 20 mg QD. He reports good compliance with treatment. He is not having side effects.  Current diet: regular Current exercise: walking  Coronary artery disease, follow up  The patient was last seen for CAD 07/25/2022. He is Eliquis 2.5 mg for a-fib.  He reports good compliance with treatment. He is not having side effects.  He is  not having to take nitroglycerine. He is experiencing none. He is not experiencing none. He is not able to carry groceries,     is able to climb stairs,      is not able to cut grass,      is not able to work in the yard without having above symptoms.  His last vist with his cardiologist was unknown     04/04/2023    8:10 AM 12/22/2021    9:14 AM 09/08/2020    2:25 PM 01/21/2020   10:28 AM 01/21/2020   10:13 AM  Depression screen PHQ 2/9  Decreased Interest 0 0 0 0 0  Down, Depressed, Hopeless 0 0 0 0 0  PHQ - 2 Score 0 0 0 0 0        04/04/2023    8:09 AM  Fall Risk   Falls in the past year? 0  Number falls in past yr: 0  Injury with Fall? 0  Risk for fall due to : No Fall Risks  Follow up Falls evaluation completed    Patient Care Team: Langley Gauss, [place] as PCP - General (Physician Assistant)   Review of Systems  Constitutional:  Negative for fatigue.  HENT:  Negative for congestion, ear pain and sore throat.   Respiratory:  Negative for cough and shortness of breath.   Cardiovascular:  Negative for chest pain.  Gastrointestinal:  Negative for abdominal pain, constipation, diarrhea, nausea and vomiting.  Genitourinary:  Negative for dysuria, frequency and urgency.  Musculoskeletal:  Negative for arthralgias, back pain and myalgias.  Neurological:  Negative for dizziness and headaches.  Psychiatric/Behavioral:  Negative for agitation and sleep disturbance. The patient is not nervous/anxious.     Current Outpatient Medications on File Prior to Visit  Medication Sig Dispense Refill   apixaban (ELIQUIS) 2.5 MG TABS tablet Take 1 tablet (2.5 mg total) by mouth 2 (two) times daily. 180 tablet 3   cyanocobalamin (,VITAMIN B-12,) 1000 MCG/ML injection INJECT 1 ML (1,000 MCG TOTAL) INTO THE MUSCLE EVERY 30 DAYS. 3 mL 6   levothyroxine (SYNTHROID) 50 MCG tablet TAKE 1 TABLET BY MOUTH EVERY DAY 90 tablet 1   simvastatin (ZOCOR) 20 MG tablet Take 1 tablet (20 mg total) by mouth  daily. 90 tablet 2   verapamil (CALAN-SR) 240 MG CR tablet TAKE 1 TABLET BY MOUTH EVERY DAY 90 tablet 0   No current facility-administered medications on file prior to visit.   Past Medical History:  Diagnosis Date   Adult BMI <19 kg/sq m 01/21/2020   Alzheimer disease (HCC)    Atherosclerotic heart disease of native coronary artery without angina pectoris    CHRONIC    Atrial fibrillation (HCC) 06/03/2022   B12 deficiency    CHRONIC   Bilateral carotid bruits 01/28/2020   Chronic atrial fibrillation (HCC) 01/21/2020   CKD (chronic kidney disease), stage II    MILD   CKD (chronic kidney disease), stage III (HCC)    MODERATE (CHRONIC)   Contusion of left knee 09/28/2021   Coronary atherosclerosis    CHRONIC   Essential hypertension 01/28/2020   Essential hypertension, benign    Essential tremor    CHRONIC   Malnutrition of moderate degree (HCC) 01/21/2020   Memory deficit 09/08/2020   Mixed hearing loss, bilateral    CHRONIC   Mixed hyperlipidemia    CHRONIC    Neck pain 07/01/2021   Obstructive chronic bronchitis without exacerbation 01/21/2020   Osteoarthritis    OF KNEE CHRONIC   Personal history of malignant neoplasm of larynx 01/18/2020   Primary hypothyroidism 01/22/2020   Unstable gait 01/21/2020   Past Surgical History:  Procedure Laterality Date   CATARACT EXTRACTION, BILATERAL      Family History  Problem Relation Age of Onset   Hypertension Neg Hx    Heart disease Neg Hx    Cancer Neg Hx    Diabetes Neg Hx    Social History   Socioeconomic History   Marital status: Widowed    Spouse name: Not on file   Number of children: 2   Years of education: Not on file   Highest education level: Not on file  Occupational History   Occupation: retired   Occupation: Warehouse manager  Tobacco Use   Smoking status: Former    Current packs/day: 0.00    Types: Cigarettes    Quit date: 2008    Years since quitting: 16.5   Smokeless tobacco: Current     Types: Chew   Tobacco comments:    1 pouch sometimes  Vaping Use   Vaping status: Never Used  Substance and Sexual Activity   Alcohol use: No   Drug use: No   Sexual activity: Yes  Other Topics Concern   Not on file  Social History Narrative   Not on file   Social Determinants of Health   Financial Resource Strain: Low Risk  (04/04/2023)   Overall Financial Resource Strain (CARDIA)    Difficulty of Paying Living Expenses: Not hard at  all  Food Insecurity: No Food Insecurity (04/04/2023)   Hunger Vital Sign    Worried About Running Out of Food in the Last Year: Never true    Ran Out of Food in the Last Year: Never true  Transportation Needs: No Transportation Needs (04/04/2023)   PRAPARE - Administrator, Civil Service (Medical): No    Lack of Transportation (Non-Medical): No  Physical Activity: Sufficiently Active (04/04/2023)   Exercise Vital Sign    Days of Exercise per Week: 5 days    Minutes of Exercise per Session: 60 min  Stress: No Stress Concern Present (04/04/2023)   [employer] of Occupational Health - Occupational Stress Questionnaire    Feeling of Stress : Not at all  Social Connections: Moderately Isolated (04/04/2023)   Social Connection and Isolation Panel [NHANES]    Frequency of Communication with Friends and Family: Once a week    Frequency of Social Gatherings with Friends and Family: More than three times a week    Attends Religious Services: Never    Database administrator or Organizations: No    Attends Engineer, structural: More than 4 times per year    Marital Status: Widowed    Objective:  BP 110/70 (BP Location: Left Arm, Patient Position: Sitting, Cuff Size: Normal)   Pulse 74   Temp (!) 97.3 F (36.3 C) (Temporal)   Ht 5\' 6"  (1.676 m)   Wt 107 lb 9.6 oz (48.8 kg)   SpO2 96%   BMI 17.37 kg/m      04/04/2023    8:06 AM 09/03/2022    8:58 AM 08/12/2022    1:35 PM  BP/Weight  Systolic BP 110 92 120  Diastolic BP  70 58 60  Wt. (Lbs) 107.6 113 113.6  BMI 17.37 kg/m2 18.24 kg/m2 18.34 kg/m2    Physical Exam Vitals reviewed.  Constitutional:      Appearance: Normal appearance.  Cardiovascular:     Rate and Rhythm: Normal rate and regular rhythm.     Heart sounds: Normal heart sounds.  Pulmonary:     Effort: Pulmonary effort is normal.     Breath sounds: Normal breath sounds.  Abdominal:     General: Bowel sounds are normal.     Palpations: Abdomen is soft.     Tenderness: There is no abdominal tenderness.  Skin:    Findings: Wound present. No rash. Rash is not macular, papular or urticarial.       Neurological:     Mental Status: He is alert and oriented to person, place, and time.  Psychiatric:        Mood and Affect: Mood normal.        Behavior: Behavior normal.    Foreign Body Removal  Date/Time: 04/04/2023 8:45 AM  Performed by: Langley Gauss, PA Authorized by: Langley Gauss, PA  Body area: skin General location: upper extremity Location details: left wrist  Anesthesia: Local Anesthetic: co-phenylcaine spray  Sedation: Patient sedated: no  Patient restrained: no Patient cooperative: yes Localization method: visualized Removal mechanism: forceps Dressing: antibiotic ointment and dressing applied Tendon involvement: none Depth: subcutaneous Complexity: simple 1 objects recovered. Post-procedure assessment: foreign body removed Patient tolerance: patient tolerated the procedure well with no immediate complications Comments: Tick     Diabetic Foot Exam - Simple   No data filed      Lab Results  Component Value Date   WBC 4.9 08/12/2022   HGB 12.2 (L) 08/12/2022   HCT  35.2 (L) 08/12/2022   PLT 168 08/12/2022   GLUCOSE 82 08/12/2022   CHOL 136 08/12/2022   TRIG 60 08/12/2022   HDL 63 08/12/2022   LDLCALC 60 08/12/2022   ALT 7 08/12/2022   AST 16 08/12/2022   NA 141 08/12/2022   K 4.1 08/12/2022   CL 105 08/12/2022   CREATININE 1.15 08/12/2022   BUN 15  08/12/2022   CO2 22 08/12/2022   TSH 3.910 06/03/2022      Assessment & Plan:    Essential hypertension Assessment & Plan: Uncontrolled Continue to monitor signs and symptoms Continue taking Verapamil 240mg   as directed Will increase hydration to help increase BP   Orders: -     CBC with Differential/Platelet -     Comprehensive metabolic panel  Chronic atrial fibrillation (HCC) Assessment & Plan: Controlled  Denies any major side effects or issues with medication Continue taking Eliquis 2.5mg  as directed Will adjust as needed   Primary hypothyroidism Assessment & Plan: Controlled Labs drawn today Continue taking Synthroid as directed Will adjust treatment depending on labs  Orders: -     TSH  Alzheimer disease (HCC) Assessment & Plan: Incrased Namenda to 15mg  daily with one 10mg  tablet taken morning or night, and 5mg  tablet taken opposite 10mg  Continue to monitor symptoms If symptoms decline rapidly let us know  Orders: -     Memantine HCl; Take 1 tablet (10 mg total) by mouth 2 (two) times daily.  Dispense: 60 tablet; Refill: 1  Mixed hyperlipidemia Assessment & Plan: Well controlled.  Continue to work on eating a healthy diet and exercise.  Labs drawn today.   No major side effects reported, and no issues with compliance. The current medical regimen is effective;  continue present plan with Simvastatin 20mg  Will adjust medication as needed depending on labs Lab Results  Component Value Date   LDLCALC 60 08/12/2022     Orders: -     Lipid panel  B12 deficiency Assessment & Plan: Labs drawn today Will send new prescription depending on resutls Continue to monitor signs and symptoms  Orders: -     Vitamin B12  Tick bite of left wrist, initial encounter -     Spotted Fever Group Antibodies -     Lyme Disease Serology w/Reflex  Tick bite with subsequent removal of tick Assessment & Plan: Labs drawn Will adjust treatment based on  labs Doxycycline sent as preventative to pharmacy  Orders: -     Doxycycline Hyclate; Take 1 capsule (50 mg total) by mouth 2 (two) times daily.  Dispense: 10 capsule; Refill: 5  Other orders -     Foreign Body Removal     Meds ordered this encounter  Medications   memantine (NAMENDA) 10 MG tablet    Sig: Take 1 tablet (10 mg total) by mouth 2 (two) times daily.    Dispense:  60 tablet    Refill:  1   doxycycline (VIBRAMYCIN) 50 MG capsule    Sig: Take 1 capsule (50 mg total) by mouth 2 (two) times daily.    Dispense:  10 capsule    Refill:  5    Orders Placed This Encounter  Procedures   Foreign Body Removal   CBC with Differential/Platelet   Comprehensive metabolic panel   Lipid panel   TSH   Vitamin B12   Spotted Fever Group Antibodies   Lyme Disease Serology w/Reflex     Follow-up: No follow-ups on file.   Clayborn Bigness  I Leal-Borjas,acting as a scribe for [employer], PA.,have documented all relevant documentation on the behalf of Langley Gauss, PA,as directed by  Langley Gauss, PA while in the presence of Langley Gauss, [place].   An After Visit Summary was printed and given to the patient.  Langley Gauss, [place] [facility] [phone]

## 2023-04-04 ENCOUNTER — Encounter: Payer: Self-pay | Admitting: Physician Assistant

## 2023-04-04 ENCOUNTER — Ambulatory Visit (INDEPENDENT_AMBULATORY_CARE_PROVIDER_SITE_OTHER): Payer: Medicare Other | Admitting: Physician Assistant

## 2023-04-04 VITALS — BP 110/70 | HR 74 | Temp 97.3°F | Ht 66.0 in | Wt 107.6 lb

## 2023-04-04 DIAGNOSIS — I10 Essential (primary) hypertension: Secondary | ICD-10-CM | POA: Diagnosis not present

## 2023-04-04 DIAGNOSIS — E782 Mixed hyperlipidemia: Secondary | ICD-10-CM

## 2023-04-04 DIAGNOSIS — E538 Deficiency of other specified B group vitamins: Secondary | ICD-10-CM

## 2023-04-04 DIAGNOSIS — E039 Hypothyroidism, unspecified: Secondary | ICD-10-CM

## 2023-04-04 DIAGNOSIS — G309 Alzheimer's disease, unspecified: Secondary | ICD-10-CM

## 2023-04-04 DIAGNOSIS — W57XXXA Bitten or stung by nonvenomous insect and other nonvenomous arthropods, initial encounter: Secondary | ICD-10-CM | POA: Insufficient documentation

## 2023-04-04 DIAGNOSIS — S60862A Insect bite (nonvenomous) of left wrist, initial encounter: Secondary | ICD-10-CM

## 2023-04-04 DIAGNOSIS — I482 Chronic atrial fibrillation, unspecified: Secondary | ICD-10-CM

## 2023-04-04 DIAGNOSIS — F0280 Dementia in other diseases classified elsewhere without behavioral disturbance: Secondary | ICD-10-CM

## 2023-04-04 MED ORDER — DOXYCYCLINE HYCLATE 50 MG PO CAPS: 50.0000 mg | ORAL_CAPSULE | Freq: Two times a day (BID) | ORAL | 5 refills | Status: DC

## 2023-04-04 MED ORDER — MEMANTINE HCL 10 MG PO TABS: 10.0000 mg | ORAL_TABLET | Freq: Two times a day (BID) | ORAL | 1 refills | Status: DC

## 2023-04-04 NOTE — Assessment & Plan Note (Signed)
Incrased Namenda to 15mg  daily with one 10mg  tablet taken morning or night, and 5mg  tablet taken opposite 10mg  Continue to monitor symptoms If symptoms decline rapidly let us know

## 2023-04-04 NOTE — Assessment & Plan Note (Signed)
Labs drawn Will adjust treatment based on labs Doxycycline sent as preventative to pharmacy

## 2023-04-04 NOTE — Assessment & Plan Note (Signed)
Uncontrolled Continue to monitor signs and symptoms Continue taking Verapamil 240mg   as directed Will increase hydration to help increase BP

## 2023-04-04 NOTE — Assessment & Plan Note (Signed)
Controlled  Denies any major side effects or issues with medication Continue taking Eliquis 2.5mg  as directed Will adjust as needed

## 2023-04-04 NOTE — Assessment & Plan Note (Signed)
Labs drawn today Will send new prescription depending on resutls Continue to monitor signs and symptoms

## 2023-04-04 NOTE — Assessment & Plan Note (Signed)
Controlled Labs drawn today Continue taking Synthroid as directed Will adjust treatment depending on labs

## 2023-04-04 NOTE — Assessment & Plan Note (Signed)
Well controlled.  Continue to work on eating a healthy diet and exercise.  Labs drawn today.   No major side effects reported, and no issues with compliance. The current medical regimen is effective;  continue present plan with Simvastatin 20mg  Will adjust medication as needed depending on labs Lab Results  Component Value Date   LDLCALC 60 08/12/2022

## 2023-04-06 LAB — LYME DISEASE SEROLOGY W/REFLEX: Lyme Total Antibody EIA: NEGATIVE

## 2023-04-08 LAB — COMPREHENSIVE METABOLIC PANEL: BUN: 15 mg/dL (ref 10–36)

## 2023-04-26 ENCOUNTER — Other Ambulatory Visit: Payer: Self-pay | Admitting: Physician Assistant

## 2023-04-26 DIAGNOSIS — F0280 Dementia in other diseases classified elsewhere without behavioral disturbance: Secondary | ICD-10-CM

## 2023-04-28 ENCOUNTER — Ambulatory Visit (INDEPENDENT_AMBULATORY_CARE_PROVIDER_SITE_OTHER): Payer: Medicare Other

## 2023-04-28 VITALS — BP 106/70 | HR 72 | Resp 16 | Ht 66.0 in | Wt 107.0 lb

## 2023-04-28 DIAGNOSIS — Z0000 Encounter for general adult medical examination without abnormal findings: Secondary | ICD-10-CM

## 2023-04-28 DIAGNOSIS — Z748 Other problems related to care provider dependency: Secondary | ICD-10-CM | POA: Diagnosis not present

## 2023-04-28 NOTE — Progress Notes (Signed)
Subjective:   Lucas Kline is a 89 y.o. male who presents for Medicare Annual/Subsequent preventive examination.  This wellness visit is conducted by a nurse.  The patient's medications were reviewed and reconciled since the patient's last visit.  History details were provided by the patient and his daughter.  The history appears to be reliable.    Medical History: Patient history and Family history was reviewed  Medications, Allergies, and preventative health maintenance was reviewed and updated.   Visit Complete: In person  Cardiac Risk Factors include: male gender;dyslipidemia     Objective:    Today's Vitals   04/28/23 1534  BP: 106/70  Pulse: 72  Resp: 16  SpO2: 96%  Weight: 107 lb (48.5 kg)  Height: 5\' 6"  (1.676 m)  PainSc: 0-No pain   Body mass index is 17.27 kg/m.     09/08/2020    2:23 PM 02/12/2019    3:07 PM  Advanced Directives  Does Patient Have a Medical Advance Directive? No No    Current Medications (verified) Outpatient Encounter Medications as of 04/28/2023  Medication Sig   apixaban (ELIQUIS) 2.5 MG TABS tablet Take 1 tablet (2.5 mg total) by mouth 2 (two) times daily.   levothyroxine (SYNTHROID) 50 MCG tablet TAKE 1 TABLET BY MOUTH EVERY DAY   memantine (NAMENDA) 10 MG tablet Take 1 tablet (10 mg total) by mouth 2 (two) times daily.   simvastatin (ZOCOR) 20 MG tablet Take 1 tablet (20 mg total) by mouth daily.   verapamil (CALAN-SR) 240 MG CR tablet TAKE 1 TABLET BY MOUTH EVERY DAY   [DISCONTINUED] cyanocobalamin (,VITAMIN B-12,) 1000 MCG/ML injection INJECT 1 ML (1,000 MCG TOTAL) INTO THE MUSCLE EVERY 30 DAYS.   [DISCONTINUED] doxycycline (VIBRAMYCIN) 50 MG capsule Take 1 capsule (50 mg total) by mouth 2 (two) times daily.   No facility-administered encounter medications on file as of 04/28/2023.    Allergies (verified) Aricept [donepezil]   History: Past Medical History:  Diagnosis Date   Adult BMI <19 kg/sq m 01/21/2020    Alzheimer disease (HCC)    Atherosclerotic heart disease of native coronary artery without angina pectoris    CHRONIC    Atrial fibrillation (HCC) 06/03/2022   B12 deficiency    CHRONIC   Bilateral carotid bruits 01/28/2020   Chronic atrial fibrillation (HCC) 01/21/2020   CKD (chronic kidney disease), stage II    MILD   CKD (chronic kidney disease), stage III (HCC)    MODERATE (CHRONIC)   Contusion of left knee 09/28/2021   Coronary atherosclerosis    CHRONIC   Essential hypertension 01/28/2020   Essential hypertension, benign    Essential tremor    CHRONIC   Malnutrition of moderate degree (HCC) 01/21/2020   Memory deficit 09/08/2020   Mixed hearing loss, bilateral    CHRONIC   Mixed hyperlipidemia    CHRONIC    Neck pain 07/01/2021   Obstructive chronic bronchitis without exacerbation 01/21/2020   Osteoarthritis    OF KNEE CHRONIC   Personal history of malignant neoplasm of larynx 01/18/2020   Primary hypothyroidism 01/22/2020   Unstable gait 01/21/2020   Past Surgical History:  Procedure Laterality Date   CATARACT EXTRACTION, BILATERAL     Family History  Problem Relation Age of Onset   Hypertension Neg Hx    Heart disease Neg Hx    Cancer Neg Hx    Diabetes Neg Hx    Social History   Socioeconomic History   Marital status: Widowed  Spouse name: Not on file   Number of children: 2   Years of education: Not on file   Highest education level: Not on file  Occupational History   Occupation: retired   Occupation: Warehouse manager  Tobacco Use   Smoking status: Former    Current packs/day: 0.00    Types: Cigarettes    Quit date: 2008    Years since quitting: 16.6   Smokeless tobacco: Current    Types: Chew   Tobacco comments:    1 pouch sometimes  Vaping Use   Vaping status: Never Used  Substance and Sexual Activity   Alcohol use: No   Drug use: No   Sexual activity: Yes  Other Topics Concern   Not on file  Social History Narrative   Not on  file   Social Determinants of Health   Financial Resource Strain: Low Risk  (04/04/2023)   Overall Financial Resource Strain (CARDIA)    Difficulty of Paying Living Expenses: Not hard at all  Food Insecurity: No Food Insecurity (04/04/2023)   Hunger Vital Sign    Worried About Running Out of Food in the Last Year: Never true    Ran Out of Food in the Last Year: Never true  Transportation Needs: No Transportation Needs (04/04/2023)   PRAPARE - Administrator, Civil Service (Medical): No    Lack of Transportation (Non-Medical): No  Physical Activity: Sufficiently Active (04/04/2023)   Exercise Vital Sign    Days of Exercise per Week: 5 days    Minutes of Exercise per Session: 60 min  Stress: No Stress Concern Present (04/04/2023)   [employer] of Occupational Health - Occupational Stress Questionnaire    Feeling of Stress : Not at all  Social Connections: Moderately Isolated (04/04/2023)   Social Connection and Isolation Panel [NHANES]    Frequency of Communication with Friends and Family: Once a week    Frequency of Social Gatherings with Friends and Family: More than three times a week    Attends Religious Services: Never    Database administrator or Organizations: No    Attends Engineer, structural: More than 4 times per year    Marital Status: Widowed    Tobacco Counseling Ready to quit: Not Answered Counseling given: Not Answered Tobacco comments: 1 pouch sometimes   Clinical Intake:  Pre-visit preparation completed: Yes Pain : No/denies pain Pain Score: 0-No pain   BMI - recorded: 17.27 Nutritional Status: BMI <19  Underweight Nutritional Risks: Unintentional weight loss (usually eats two meals a day and will not drink the protein shakes) Diabetes: No How often do you need to have someone help you when you read instructions, pamphlets, or other written materials from your doctor or pharmacy?: 3 - Sometimes Interpreter Needed?: No     Activities of Daily Living    04/28/2023    3:13 PM  In your present state of health, do you have any difficulty performing the following activities:  Hearing? 1  Vision? 0  Difficulty concentrating or making decisions? 0  Walking or climbing stairs? 1  Dressing or bathing? 0  Doing errands, shopping? 1  Preparing Food and eating ? N  Using the Toilet? N  In the past six months, have you accidently leaked urine? N  Do you have problems with loss of bowel control? N  Managing your Medications? N  Managing your Finances? N  Housekeeping or managing your Housekeeping? N    Patient Care Team: Craft,  Huston Foley, [place] as PCP - General (Physician Assistant)     Assessment:   This is a routine wellness examination for Lucas Kline.  Hearing/Vision screen No results found.  Dietary issues and exercise activities discussed: Aim for 30 minutes of exercise or brisk walking, 6-8 glasses of water, and 5 servings of fruits and vegetables each day.     Depression Screen    04/28/2023    3:39 PM 04/04/2023    8:10 AM 12/22/2021    9:14 AM 09/08/2020    2:25 PM 01/21/2020   10:28 AM 01/21/2020   10:13 AM  PHQ 2/9 Scores  PHQ - 2 Score 0 0 0 0 0 0    Fall Risk    04/28/2023    3:37 PM 04/04/2023    8:09 AM 08/12/2022    1:40 PM 12/22/2021    9:14 AM 10/23/2020    9:09 AM  Fall Risk   Falls in the past year? 0 0 0 0 1  Number falls in past yr: 0 0 0 0 1  Injury with Fall? 0 0 0 0 1  Risk for fall due to : No Fall Risks No Fall Risks No Fall Risks  Impaired balance/gait;Mental status change  Follow up Falls evaluation completed;Education provided Falls evaluation completed Falls evaluation completed  Falls evaluation completed    MEDICARE RISK AT HOME:  Medicare Risk at Home - 04/28/23 1539     Any stairs in or around the home? Yes    If so, are there any without handrails? No    Home free of loose throw rugs in walkways, pet beds, electrical cords, etc? Yes    Adequate lighting in your  home to reduce risk of falls? Yes    Life alert? No    Use of a cane, walker or w/c? No    Grab bars in the bathroom? No    Shower chair or bench in shower? No    Elevated toilet seat or a handicapped toilet? No             TIMED UP AND GO:  Was the test performed?  No    Cognitive Function:    03/10/2022    1:25 PM 12/22/2021    9:50 AM 08/31/2021    3:43 PM 10/23/2020    9:25 AM 09/08/2020    2:59 PM  MMSE - Mini Mental State Exam  Orientation to time 2 2 4 3 3   Orientation to Place 5 2 0 3 3  Registration 3 3 3 3 2   Attention/ Calculation 0 0 0 0 0  Recall 1 0 2 0 2  Language- name 2 objects 2 2 2 2 2   Language- repeat 0 1 1 1 1   Language- follow 3 step command 3 3 3 3 3   Language- read & follow direction 1 1 1 1 1   Write a sentence 1 1 1 1 1   Copy design 1 1 1 1 1   Total score 19 16 18 18 19         Immunizations Immunization History  Administered Date(s) Administered   PNEUMOCOCCAL CONJUGATE-20 08/12/2022   Pneumococcal Conjugate-13 01/17/2014   Pneumococcal Polysaccharide-23 01/17/2014   Tdap 12/29/2011    TDAP status: Due, Education has been provided regarding the importance of this vaccine. Advised may receive this vaccine at local pharmacy or Health Dept. Aware to provide a copy of the vaccination record if obtained from local pharmacy or Health Dept. Verbalized acceptance and understanding.  Flu  Vaccine status: Declined, Education has been provided regarding the importance of this vaccine but patient still declined. Advised may receive this vaccine at local pharmacy or Health Dept. Aware to provide a copy of the vaccination record if obtained from local pharmacy or Health Dept. Verbalized acceptance and understanding.  Pneumococcal vaccine status: Up to date  Covid-19 vaccine status: Declined, Education has been provided regarding the importance of this vaccine but patient still declined. Advised may receive this vaccine at local pharmacy or Health  Dept.or vaccine clinic. Aware to provide a copy of the vaccination record if obtained from local pharmacy or Health Dept. Verbalized acceptance and understanding.  Qualifies for Shingles Vaccine? Yes   Zostavax completed No   Shingrix Completed?: No.    Education has been provided regarding the importance of this vaccine. Patient has been advised to call insurance company to determine out of pocket expense if they have not yet received this vaccine. Advised may also receive vaccine at local pharmacy or Health Dept. Verbalized acceptance and understanding.  Screening Tests Health Maintenance  Topic Date Due   Medicare Annual Wellness (AWV)  09/08/2021   DTaP/Tdap/Td (2 - Td or Tdap) 12/28/2021   INFLUENZA VACCINE  12/12/2023 (Originally 04/14/2023)   Pneumonia Vaccine 13+ Years old  Completed   HPV VACCINES  Aged Out   COVID-19 Vaccine  Discontinued   Zoster Vaccines- Shingrix  Discontinued    Health Maintenance  Health Maintenance Due  Topic Date Due   Medicare Annual Wellness (AWV)  09/08/2021   DTaP/Tdap/Td (2 - Td or Tdap) 12/28/2021    Colorectal cancer screening: No longer required.   Lung Cancer Screening: (Low Dose CT Chest recommended if Age 41-80 years, 20 pack-year currently smoking OR have quit w/in 15years.) does not qualify.    Additional Screening:  Vision Screening: Recommended annual ophthalmology exams for early detection of glaucoma and other disorders of the eye. Is the patient up to date with their annual eye exam?  No   Dental Screening: Recommended annual dental exams for proper oral hygiene   Community Resource Referral / Chronic Care Management: CRR required this visit?  No   CCM required this visit?  No     Plan:    1- Patient declines all vaccines 2- Encouraged protein shakes to reduce unwanted weight loss 3- Daughter is looking for something he can do during the day to get him out of the house that includes transportation - They live in  [facility], gave information for [employer].  4- Daughter is looking for resources for a telephone for the hearing impaired, provided [facility] contact for hearing impaired services.   I have personally reviewed and noted the following in the patient's chart:   Medical and social history Use of alcohol, tobacco or illicit drugs  Current medications and supplements including opioid prescriptions. Patient is not currently taking opioid prescriptions. Functional ability and status Nutritional status Physical activity Advanced directives List of other physicians Hospitalizations, surgeries, and ER visits in previous 12 months Vitals Screenings to include cognitive, depression, and falls Referrals and appointments  In addition, I have reviewed and discussed with patient certain preventive protocols, quality metrics, and best practice recommendations. A written personalized care plan for preventive services as well as general preventive health recommendations were provided to patient.     Jacklynn Bue, LPN   [id number]

## 2023-05-06 ENCOUNTER — Other Ambulatory Visit: Payer: Self-pay | Admitting: Family Medicine

## 2023-05-06 DIAGNOSIS — E039 Hypothyroidism, unspecified: Secondary | ICD-10-CM

## 2023-05-09 ENCOUNTER — Other Ambulatory Visit: Payer: Self-pay

## 2023-05-11 ENCOUNTER — Other Ambulatory Visit: Payer: Self-pay | Admitting: Family Medicine

## 2023-05-11 ENCOUNTER — Other Ambulatory Visit: Payer: Self-pay

## 2023-05-15 ENCOUNTER — Other Ambulatory Visit: Payer: Self-pay | Admitting: Cardiology

## 2023-05-15 DIAGNOSIS — E782 Mixed hyperlipidemia: Secondary | ICD-10-CM

## 2023-05-30 ENCOUNTER — Other Ambulatory Visit: Payer: Self-pay | Admitting: Family Medicine

## 2023-06-01 ENCOUNTER — Other Ambulatory Visit: Payer: Self-pay

## 2023-06-02 ENCOUNTER — Other Ambulatory Visit: Payer: Self-pay

## 2023-06-02 DIAGNOSIS — F0280 Dementia in other diseases classified elsewhere without behavioral disturbance: Secondary | ICD-10-CM

## 2023-06-02 MED ORDER — MEMANTINE HCL 10 MG PO TABS
10.0000 mg | ORAL_TABLET | Freq: Two times a day (BID) | ORAL | 1 refills | Status: DC
Start: 2023-06-02 — End: 2023-10-16

## 2023-07-14 ENCOUNTER — Telehealth: Payer: Self-pay

## 2023-07-14 NOTE — Telephone Encounter (Signed)
Patient's grandaughter, Mardella Layman, left a VM requesting help paying for Eliquis.  I returned her call and LM stating that I have a patient assistance application and she can either come by to get it or I can mail it to her.

## 2023-07-20 NOTE — Telephone Encounter (Signed)
Mailed to patient

## 2023-10-03 ENCOUNTER — Ambulatory Visit (INDEPENDENT_AMBULATORY_CARE_PROVIDER_SITE_OTHER): Payer: Medicare Other | Admitting: Physician Assistant

## 2023-10-03 ENCOUNTER — Encounter: Payer: Self-pay | Admitting: Physician Assistant

## 2023-10-03 VITALS — BP 128/68 | HR 66 | Temp 97.8°F | Ht 66.0 in | Wt 103.0 lb

## 2023-10-03 DIAGNOSIS — G309 Alzheimer's disease, unspecified: Secondary | ICD-10-CM

## 2023-10-03 DIAGNOSIS — Z681 Body mass index (BMI) 19 or less, adult: Secondary | ICD-10-CM

## 2023-10-03 DIAGNOSIS — I482 Chronic atrial fibrillation, unspecified: Secondary | ICD-10-CM

## 2023-10-03 DIAGNOSIS — I10 Essential (primary) hypertension: Secondary | ICD-10-CM | POA: Diagnosis not present

## 2023-10-03 DIAGNOSIS — E039 Hypothyroidism, unspecified: Secondary | ICD-10-CM

## 2023-10-03 DIAGNOSIS — F0280 Dementia in other diseases classified elsewhere without behavioral disturbance: Secondary | ICD-10-CM

## 2023-10-03 DIAGNOSIS — E782 Mixed hyperlipidemia: Secondary | ICD-10-CM

## 2023-10-03 NOTE — Assessment & Plan Note (Signed)
Well controlled.  Continue to work on eating a healthy diet and exercise.  Labs drawn today.   No major side effects reported, and no issues with compliance. The current medical regimen is effective;  continue present plan with Simvastatin 20mg  Will adjust medication as needed depending on labs Lab Results  Component Value Date   LDLCALC 79 04/04/2023

## 2023-10-03 NOTE — Assessment & Plan Note (Signed)
Well controlled.  Continue to work on eating a healthy diet and exercise.  Labs drawn today.   No major side effects reported, and no issues with compliance. The current medical regimen is effective;  continue present plan with verapamil 240mg  Will adjust medication as needed depending on labs BP Readings from Last 3 Encounters:  04/28/23 106/70  04/04/23 110/70  09/03/22 (!) 92/58

## 2023-10-03 NOTE — Assessment & Plan Note (Signed)
Gradual weight loss over the past few years, currently weighing 109 lbs. Possible inadequate caloric intake due to living alone and inconsistent meal patterns. No excessive diarrhea or other gastrointestinal symptoms reported. -Increase caloric intake, focusing on high-protein foods and supplements. -Trial of Fairlife or regular chocolate milk as a protein supplement. -Monitor weight and nutritional status closely.

## 2023-10-03 NOTE — Progress Notes (Signed)
Subjective:  Patient ID: Lucas Kline, male    DOB: 09-13-1929  Age: 89 y.o. MRN: [id number]  Chief Complaint  Patient presents with   Medical Management of Chronic Issues    HPI   Discussed the use of AI scribe software for clinical note transcription with the patient, who gave verbal consent to proceed.  History of Present Illness   A 89 year old male with a history of thyroid issues and Alzheimer's presents for a chronic follow-up. The patient's daughter reports that the patient has been experiencing weight loss, decreased food intake, and some confusion. The patient's weight has been steadily decreasing, and he is currently at 109 pounds. The patient lives alone and is often home by himself during the day. His typical diet includes pop tarts and coffee for breakfast, and he often goes out to eat for supper. The patient's daughter is unsure of what he eats for lunch. The patient has a history of smoking and currently uses chewing tobacco. He also had a tick removal in the past and was prescribed antibiotics for it. The patient's daughter is seeking help for his Eliquis medication due to the donut hole. The patient also has cold hands and feet, which may be due to his blood thinning medication, Eliquis. Despite these issues, the patient remains active, walking at least a mile a day with his beagles.       Hypertension, follow-up: He was last seen for hypertension 7 months ago.  BP at that visit was 128/76. Management includes Verapamil 240 mg QD. He reports good compliance with treatment. He is not having side effects.  He is following a Regular diet. He is not exercising. He does not smoke.  Use of agents associated with hypertension: none.  Outside blood pressures are not being checked .  Lipid/Cholesterol, Follow-up  He was last seen for this 7 months ago.  Management includes Zocor 20 mg QD. He reports good compliance with treatment. He is not having side effects.   Current diet: regular Current exercise: walking  Coronary artery disease, follow up  The patient was last seen for CAD 07/25/2022. He is Eliquis 2.5 mg for a-fib.  He reports good compliance with treatment. He is not having side effects.  He is not having to take nitroglycerine. He is experiencing none. He is not experiencing none. He is not able to carry groceries,     is able to climb stairs,      is not able to cut grass,      is not able to work in the yard without having above symptoms.  His last vist with his cardiologist was unknown     10/03/2023    8:39 AM 04/28/2023    3:39 PM 04/04/2023    8:10 AM 12/22/2021    9:14 AM 09/08/2020    2:25 PM  Depression screen PHQ 2/9  Decreased Interest 0 0 0 0 0  Down, Depressed, Hopeless 0 0 0 0 0  PHQ - 2 Score 0 0 0 0 0        10/03/2023    8:39 AM  Fall Risk   Falls in the past year? 0  Number falls in past yr: 0  Injury with Fall? 0  Risk for fall due to : No Fall Risks  Follow up Falls evaluation completed    Patient Care Team: Langley Gauss, [place] as PCP - General (Physician Assistant)   Review of Systems  Constitutional:  Negative for appetite change, fatigue  and fever.  HENT:  Negative for congestion, ear pain, sinus pressure and sore throat.   Respiratory:  Negative for cough, chest tightness, shortness of breath and wheezing.   Cardiovascular:  Negative for chest pain and palpitations.  Gastrointestinal:  Negative for abdominal pain, constipation, diarrhea, nausea and vomiting.  Genitourinary:  Negative for dysuria and hematuria.  Musculoskeletal:  Negative for arthralgias, back pain, joint swelling and myalgias.  Skin:  Negative for rash.  Neurological:  Negative for dizziness, weakness and headaches.  Psychiatric/Behavioral:  Negative for dysphoric mood. The patient is not nervous/anxious.     Current Outpatient Medications on File Prior to Visit  Medication Sig Dispense Refill   levothyroxine (SYNTHROID)  50 MCG tablet TAKE 1 TABLET BY MOUTH EVERY DAY 90 tablet 1   memantine (NAMENDA) 10 MG tablet Take 1 tablet (10 mg total) by mouth 2 (two) times daily. 60 tablet 1   simvastatin (ZOCOR) 20 MG tablet TAKE 1 TABLET BY MOUTH EVERY DAY 90 tablet 2   verapamil (CALAN-SR) 240 MG CR tablet TAKE 1 TABLET BY MOUTH EVERY DAY 90 tablet 1   apixaban (ELIQUIS) 2.5 MG TABS tablet Take 1 tablet (2.5 mg total) by mouth 2 (two) times daily. (Patient not taking: Reported on 10/03/2023) 180 tablet 3   No current facility-administered medications on file prior to visit.   Past Medical History:  Diagnosis Date   Adult BMI <19 kg/sq m 01/21/2020   Alzheimer disease (HCC)    Atherosclerotic heart disease of native coronary artery without angina pectoris    CHRONIC    Atrial fibrillation (HCC) 06/03/2022   B12 deficiency    CHRONIC   Bilateral carotid bruits 01/28/2020   Chronic atrial fibrillation (HCC) 01/21/2020   CKD (chronic kidney disease), stage II    MILD   CKD (chronic kidney disease), stage III (HCC)    MODERATE (CHRONIC)   Contusion of left knee 09/28/2021   Coronary atherosclerosis    CHRONIC   Essential hypertension 01/28/2020   Essential hypertension, benign    Essential tremor    CHRONIC   Malnutrition of moderate degree (HCC) 01/21/2020   Memory deficit 09/08/2020   Mixed hearing loss, bilateral    CHRONIC   Mixed hyperlipidemia    CHRONIC    Neck pain 07/01/2021   Obstructive chronic bronchitis without exacerbation (HCC) 01/21/2020   Osteoarthritis    OF KNEE CHRONIC   Personal history of malignant neoplasm of larynx 01/18/2020   Primary hypothyroidism 01/22/2020   Unstable gait 01/21/2020   Past Surgical History:  Procedure Laterality Date   CATARACT EXTRACTION, BILATERAL      Family History  Problem Relation Age of Onset   Hypertension Neg Hx    Heart disease Neg Hx    Cancer Neg Hx    Diabetes Neg Hx    Social History   Socioeconomic History   Marital status:  Widowed    Spouse name: Not on file   Number of children: 2   Years of education: Not on file   Highest education level: Not on file  Occupational History   Occupation: retired   Occupation: Warehouse manager  Tobacco Use   Smoking status: Former    Current packs/day: 0.00    Types: Cigarettes    Quit date: 2008    Years since quitting: 17.0   Smokeless tobacco: Current    Types: Chew   Tobacco comments:    1 pouch sometimes  Vaping Use   Vaping status: Never Used  Substance and Sexual Activity   Alcohol use: No   Drug use: No   Sexual activity: Yes  Other Topics Concern   Not on file  Social History Narrative   Not on file   Social Drivers of Health   Financial Resource Strain: Low Risk  (04/04/2023)   Overall Financial Resource Strain (CARDIA)    Difficulty of Paying Living Expenses: Not hard at all  Food Insecurity: No Food Insecurity (04/04/2023)   Hunger Vital Sign    Worried About Running Out of Food in the Last Year: Never true    Ran Out of Food in the Last Year: Never true  Transportation Needs: No Transportation Needs (04/04/2023)   PRAPARE - Administrator, Civil Service (Medical): No    Lack of Transportation (Non-Medical): No  Physical Activity: Sufficiently Active (04/04/2023)   Exercise Vital Sign    Days of Exercise per Week: 5 days    Minutes of Exercise per Session: 60 min  Stress: No Stress Concern Present (04/04/2023)   [employer] of Occupational Health - Occupational Stress Questionnaire    Feeling of Stress : Not at all  Social Connections: Moderately Isolated (04/04/2023)   Social Connection and Isolation Panel [NHANES]    Frequency of Communication with Friends and Family: Once a week    Frequency of Social Gatherings with Friends and Family: More than three times a week    Attends Religious Services: Never    Database administrator or Organizations: No    Attends Engineer, structural: More than 4 times per year     Marital Status: Widowed    Objective:  There were no vitals taken for this visit.     04/28/2023    3:34 PM 04/04/2023    8:06 AM 09/03/2022    8:58 AM  BP/Weight  Systolic BP 106 110 92  Diastolic BP 70 70 58  Wt. (Lbs) 107 107.6 113  BMI 17.27 kg/m2 17.37 kg/m2 18.24 kg/m2    Physical Exam Vitals reviewed.  Constitutional:      Appearance: Normal appearance.  Cardiovascular:     Rate and Rhythm: Normal rate and regular rhythm.     Heart sounds: Normal heart sounds.  Pulmonary:     Effort: Pulmonary effort is normal.     Breath sounds: Normal breath sounds.  Abdominal:     General: Bowel sounds are normal.     Palpations: Abdomen is soft.     Tenderness: There is no abdominal tenderness.  Neurological:     Mental Status: He is alert and oriented to person, place, and time.  Psychiatric:        Mood and Affect: Mood normal.        Behavior: Behavior normal.     Diabetic Foot Exam - Simple   No data filed      Lab Results  Component Value Date   WBC 4.4 04/04/2023   HGB 13.1 04/04/2023   HCT 39.3 04/04/2023   PLT 153 04/04/2023   GLUCOSE 90 04/04/2023   CHOL 145 04/04/2023   TRIG 83 04/04/2023   HDL 50 04/04/2023   LDLCALC 79 04/04/2023   ALT 12 04/04/2023   AST 18 04/04/2023   NA 141 04/04/2023   K 4.9 04/04/2023   CL 106 04/04/2023   CREATININE 1.29 (H) 04/04/2023   BUN 15 04/04/2023   CO2 22 04/04/2023   TSH 4.590 (H) 04/04/2023      Assessment & Plan:  Essential hypertension Assessment & Plan: Well controlled.  Continue to work on eating a healthy diet and exercise.  Labs drawn today.   No major side effects reported, and no issues with compliance. The current medical regimen is effective;  continue present plan with verapamil 240mg  Will adjust medication as needed depending on labs BP Readings from Last 3 Encounters:  04/28/23 106/70  04/04/23 110/70  09/03/22 (!) 92/58     Orders: -     Comprehensive metabolic  panel  Primary hypothyroidism Assessment & Plan: Controlled Continue taking synthroid Labs drawn today Will adjust treatment depending on results  Orders: -     TSH  Alzheimer disease (HCC) Assessment & Plan: Reports of increased confusion and word-finding difficulties since mid-2024. Currently on memantine for Alzheimer's disease. -Continue memantine as prescribed. -Encourage cognitive stimulation activities such as word searches.   Chronic atrial fibrillation (HCC) Assessment & Plan: On Eliquis, with concerns about cold extremities possibly related to medication. -Continue Eliquis as prescribed. -Educate on the potential effects of anticoagulation on circulation and body temperature.   Mixed hyperlipidemia Assessment & Plan: Well controlled.  Continue to work on eating a healthy diet and exercise.  Labs drawn today.   No major side effects reported, and no issues with compliance. The current medical regimen is effective;  continue present plan with Simvastatin 20mg  Will adjust medication as needed depending on labs Lab Results  Component Value Date   LDLCALC 79 04/04/2023     Orders: -     CBC with Differential/Platelet -     Lipid panel -     Hemoglobin A1c  Adult BMI <19 kg/sq m Assessment & Plan: Gradual weight loss over the past few years, currently weighing 109 lbs. Possible inadequate caloric intake due to living alone and inconsistent meal patterns. No excessive diarrhea or other gastrointestinal symptoms reported. -Increase caloric intake, focusing on high-protein foods and supplements. -Trial of Fairlife or regular chocolate milk as a protein supplement. -Monitor weight and nutritional status closely.    General Health Maintenance -Order labs today including thyroid function tests, A1c, complete blood count, cholesterol, liver and kidney function, and electrolytes. -Consider bone density scan given age and risk of osteopenia. -Continue current  medications as prescribed, including thyroid medication and simvastatin. -Review medication adherence and refill schedules, particularly for Eliquis and memantine. -Follow-up on recent choking episode and monitor for signs of aspiration. -Encourage continued physical activity, such as daily walks.        No orders of the defined types were placed in this encounter.   Orders Placed This Encounter  Procedures   CBC with Differential/Platelet   Comprehensive metabolic panel   Lipid panel   Hemoglobin A1c   TSH     Follow-up: Return in about 6 months (around 04/01/2024) for Chronic, Huston Foley.   I,Lauren M Auman,acting as a Neurosurgeon for [employer], PA.,have documented all relevant documentation on the behalf of Langley Gauss, PA,as directed by  Langley Gauss, PA while in the presence of Langley Gauss, [place].   An After Visit Summary was printed and given to the patient.  Langley Gauss, [place] [facility] [phone]

## 2023-10-03 NOTE — Assessment & Plan Note (Signed)
Controlled Continue taking synthroid Labs drawn today Will adjust treatment depending on results

## 2023-10-03 NOTE — Assessment & Plan Note (Signed)
Reports of increased confusion and word-finding difficulties since mid-2024. Currently on memantine for Alzheimer's disease. -Continue memantine as prescribed. -Encourage cognitive stimulation activities such as word searches.

## 2023-10-03 NOTE — Assessment & Plan Note (Signed)
On Eliquis, with concerns about cold extremities possibly related to medication. -Continue Eliquis as prescribed. -Educate on the potential effects of anticoagulation on circulation and body temperature.

## 2023-10-04 LAB — COMPREHENSIVE METABOLIC PANEL
ALT: 9 [IU]/L (ref 0–44)
AST: 13 [IU]/L (ref 0–40)
Albumin: 4 g/dL (ref 3.6–4.6)
Alkaline Phosphatase: 62 [IU]/L (ref 44–121)
BUN/Creatinine Ratio: 15 (ref 10–24)
BUN: 21 mg/dL (ref 10–36)
Bilirubin Total: 0.3 mg/dL (ref 0.0–1.2)
CO2: 24 mmol/L (ref 20–29)
Calcium: 9.2 mg/dL (ref 8.6–10.2)
Chloride: 104 mmol/L (ref 96–106)
Creatinine, Ser: 1.36 mg/dL — ABNORMAL HIGH (ref 0.76–1.27)
Globulin, Total: 2.3 g/dL (ref 1.5–4.5)
Glucose: 74 mg/dL (ref 70–99)
Potassium: 4.9 mmol/L (ref 3.5–5.2)
Sodium: 140 mmol/L (ref 134–144)
Total Protein: 6.3 g/dL (ref 6.0–8.5)
eGFR: 49 mL/min/{1.73_m2} — ABNORMAL LOW (ref >59)

## 2023-10-04 LAB — LIPID PANEL
Chol/HDL Ratio: 2.9 {ratio} (ref 0.0–5.0)
Cholesterol, Total: 179 mg/dL (ref 100–199)
HDL: 61 mg/dL (ref >39)
LDL Chol Calc (NIH): 106 mg/dL — ABNORMAL HIGH (ref 0–99)
Triglycerides: 63 mg/dL (ref 0–149)
VLDL Cholesterol Cal: 12 mg/dL (ref 5–40)

## 2023-10-04 LAB — CBC WITH DIFFERENTIAL/PLATELET
Basophils Absolute: 0 10*3/uL (ref 0.0–0.2)
Basos: 1 %
EOS (ABSOLUTE): 0.2 10*3/uL (ref 0.0–0.4)
Eos: 4 %
Hematocrit: 37.8 % (ref 37.5–51.0)
Hemoglobin: 12.6 g/dL — ABNORMAL LOW (ref 13.0–17.7)
Immature Grans (Abs): 0 10*3/uL (ref 0.0–0.1)
Immature Granulocytes: 0 %
Lymphocytes Absolute: 1 10*3/uL (ref 0.7–3.1)
Lymphs: 21 %
MCH: 31.5 pg (ref 26.6–33.0)
MCHC: 33.3 g/dL (ref 31.5–35.7)
MCV: 95 fL (ref 79–97)
Monocytes Absolute: 0.5 10*3/uL (ref 0.1–0.9)
Monocytes: 11 %
Neutrophils Absolute: 3 10*3/uL (ref 1.4–7.0)
Neutrophils: 63 %
Platelets: 171 10*3/uL (ref 150–450)
RBC: 4 x10E6/uL — ABNORMAL LOW (ref 4.14–5.80)
RDW: 11.9 % (ref 11.6–15.4)
WBC: 4.8 10*3/uL (ref 3.4–10.8)

## 2023-10-04 LAB — HEMOGLOBIN A1C
Est. average glucose Bld gHb Est-mCnc: 111 mg/dL
Hgb A1c MFr Bld: 5.5 % (ref 4.8–5.6)

## 2023-10-04 LAB — TSH: TSH: 3.54 u[IU]/mL (ref 0.450–4.500)

## 2023-10-05 ENCOUNTER — Encounter: Payer: Self-pay | Admitting: Physician Assistant

## 2023-10-10 ENCOUNTER — Ambulatory Visit: Payer: Medicare Other | Admitting: Physician Assistant

## 2023-10-16 ENCOUNTER — Other Ambulatory Visit: Payer: Self-pay | Admitting: Physician Assistant

## 2023-10-16 DIAGNOSIS — F0280 Dementia in other diseases classified elsewhere without behavioral disturbance: Secondary | ICD-10-CM

## 2023-10-18 ENCOUNTER — Inpatient Hospital Stay: Payer: Medicare Other

## 2023-10-18 ENCOUNTER — Ambulatory Visit (INDEPENDENT_AMBULATORY_CARE_PROVIDER_SITE_OTHER): Payer: Medicare Other | Admitting: Physician Assistant

## 2023-10-18 ENCOUNTER — Encounter: Payer: Self-pay | Admitting: Physician Assistant

## 2023-10-18 VITALS — BP 90/62 | HR 72 | Temp 97.2°F | Ht 66.0 in | Wt 106.0 lb

## 2023-10-18 DIAGNOSIS — S61452D Open bite of left hand, subsequent encounter: Secondary | ICD-10-CM | POA: Diagnosis not present

## 2023-10-18 DIAGNOSIS — L03114 Cellulitis of left upper limb: Secondary | ICD-10-CM

## 2023-10-18 DIAGNOSIS — W5501XD Bitten by cat, subsequent encounter: Secondary | ICD-10-CM | POA: Diagnosis not present

## 2023-10-18 MED ORDER — BACITRACIN ZINC 500 UNIT/GM EX OINT: 1.0000 | TOPICAL_OINTMENT | Freq: Two times a day (BID) | CUTANEOUS | 0 refills | Status: DC

## 2023-10-18 MED ORDER — AZITHROMYCIN 500 MG PO TABS: 500.0000 mg | ORAL_TABLET | Freq: Every day | ORAL | 0 refills | Status: DC

## 2023-10-18 NOTE — Progress Notes (Signed)
 Subjective:  Patient ID: Lucas Kline, male    DOB: 1929/02/28  Age: 89 y.o. MRN: [id number]  Chief Complaint  Patient presents with   Hospitalization Follow-up    HPI   Follow up Hospitalization  Patient was seen at [facility] 10/17/23 and discharged on the same day. He was treated for Cat bite. Treatment for this included Prescribed antibiotics.  He reports that the antibiotic is at the pharmacy but it was too late last night when discharged to pick up. He reports this condition is stayed the same.      10/03/2023    8:39 AM 04/28/2023    3:39 PM 04/04/2023    8:10 AM 12/22/2021    9:14 AM 09/08/2020    2:25 PM  Depression screen PHQ 2/9  Decreased Interest 0 0 0 0 0  Down, Depressed, Hopeless 0 0 0 0 0  PHQ - 2 Score 0 0 0 0 0        10/03/2023    8:39 AM  Fall Risk   Falls in the past year? 0  Number falls in past yr: 0  Injury with Fall? 0  Risk for fall due to : No Fall Risks  Follow up Falls evaluation completed    Patient Care Team: Milon Cleaves, Lucas Kline as PCP - General (Physician Assistant)   Review of Systems  Constitutional:  Negative for appetite change, fatigue and fever.  HENT:  Negative for congestion, ear pain, sinus pressure and sore throat.   Respiratory:  Negative for cough, chest tightness, shortness of breath and wheezing.   Cardiovascular:  Negative for chest pain and palpitations.  Gastrointestinal:  Negative for abdominal pain, constipation, diarrhea, nausea and vomiting.  Genitourinary:  Negative for dysuria and hematuria.  Musculoskeletal:  Positive for arthralgias, joint swelling and myalgias. Negative for back pain.  Skin:  Positive for wound (Cat bite to left hand). Negative for rash.  Neurological:  Negative for dizziness, weakness and headaches.  Psychiatric/Behavioral:  Negative for dysphoric mood. The patient is not nervous/anxious.     Current Outpatient Medications on File Prior to Visit  Medication Sig Dispense  Refill   levothyroxine  (SYNTHROID ) 50 MCG tablet TAKE 1 TABLET BY MOUTH EVERY DAY 90 tablet 1   memantine  (NAMENDA ) 10 MG tablet TAKE 1 TABLET BY MOUTH TWICE A DAY 60 tablet 1   verapamil  (CALAN -SR) 240 MG CR tablet TAKE 1 TABLET BY MOUTH EVERY DAY 90 tablet 1   amoxicillin -clavulanate (AUGMENTIN ) 875-125 MG tablet Take 1 tablet by mouth 2 (two) times daily. (Patient not taking: Reported on 10/18/2023)     apixaban  (ELIQUIS ) 2.5 MG TABS tablet Take 1 tablet (2.5 mg total) by mouth 2 (two) times daily. (Patient not taking: Reported on 10/18/2023) 180 tablet 3   simvastatin  (ZOCOR ) 20 MG tablet TAKE 1 TABLET BY MOUTH EVERY DAY 90 tablet 2   No current facility-administered medications on file prior to visit.   Past Medical History:  Diagnosis Date   Adult BMI <19 kg/sq m 01/21/2020   Alzheimer disease (HCC)    Atherosclerotic heart disease of native coronary artery without angina pectoris    CHRONIC    Atrial fibrillation (HCC) 06/03/2022   B12 deficiency    CHRONIC   Bilateral carotid bruits 01/28/2020   Chronic atrial fibrillation (HCC) 01/21/2020   CKD (chronic kidney disease), stage II    MILD   CKD (chronic kidney disease), stage III (HCC)    MODERATE (CHRONIC)   Contusion of  left knee 09/28/2021   Coronary atherosclerosis    CHRONIC   Essential hypertension 01/28/2020   Essential hypertension, benign    Essential tremor    CHRONIC   Malnutrition of moderate degree (HCC) 01/21/2020   Memory deficit 09/08/2020   Mixed hearing loss, bilateral    CHRONIC   Mixed hyperlipidemia    CHRONIC    Neck pain 07/01/2021   Obstructive chronic bronchitis without exacerbation (HCC) 01/21/2020   Osteoarthritis    OF KNEE CHRONIC   Personal history of malignant neoplasm of larynx 01/18/2020   Primary hypothyroidism 01/22/2020   Unstable gait 01/21/2020   Past Surgical History:  Procedure Laterality Date   CATARACT EXTRACTION, BILATERAL      Family History  Problem Relation Age of  Onset   Hypertension Neg Hx    Heart disease Neg Hx    Cancer Neg Hx    Diabetes Neg Hx    Social History   Socioeconomic History   Marital status: Widowed    Spouse name: Not on file   Number of children: 2   Years of education: Not on file   Highest education level: Not on file  Occupational History   Occupation: retired   Occupation: warehouse manager  Tobacco Use   Smoking status: Former    Current packs/day: 0.00    Types: Cigarettes    Quit date: 2008    Years since quitting: 17.1   Smokeless tobacco: Current    Types: Chew   Tobacco comments:    1 pouch sometimes  Vaping Use   Vaping status: Never Used  Substance and Sexual Activity   Alcohol  use: No   Drug use: No   Sexual activity: Yes  Other Topics Concern   Not on file  Social History Narrative   Not on file   Social Drivers of Health   Financial Resource Strain: Low Risk  (04/04/2023)   Overall Financial Resource Strain (CARDIA)    Difficulty of Paying Living Expenses: Not hard at all  Food Insecurity: No Food Insecurity (04/04/2023)   Hunger Vital Sign    Worried About Running Out of Food in the Last Year: Never true    Ran Out of Food in the Last Year: Never true  Transportation Needs: No Transportation Needs (04/04/2023)   PRAPARE - Administrator, Civil Service (Medical): No    Lack of Transportation (Non-Medical): No  Physical Activity: Sufficiently Active (04/04/2023)   Exercise Vital Sign    Days of Exercise per Week: 5 days    Minutes of Exercise per Session: 60 min  Stress: No Stress Concern Present (04/04/2023)   [employer] of Occupational Health - Occupational Stress Questionnaire    Feeling of Stress : Not at all  Social Connections: Moderately Isolated (04/04/2023)   Social Connection and Isolation Panel [NHANES]    Frequency of Communication with Friends and Family: Once a week    Frequency of Social Gatherings with Friends and Family: More than three times a week     Attends Religious Services: Never    Database Administrator or Organizations: No    Attends Engineer, Structural: More than 4 times per year    Marital Status: Widowed    Objective:  BP 90/62 (BP Location: Left Arm, Patient Position: Sitting)   Pulse 72   Temp (!) 97.2 F (36.2 C) (Temporal)   Ht 5' 6 (1.676 m)   Wt 106 lb (48.1 kg)   SpO2 98%  BMI 17.11 kg/m      10/18/2023    1:29 PM 10/03/2023    2:40 PM 04/28/2023    3:34 PM  BP/Weight  Systolic BP 90 128 106  Diastolic BP 62 68 70  Wt. (Lbs) 106 103 107  BMI 17.11 kg/m2 16.62 kg/m2 17.27 kg/m2    Physical Exam Vitals reviewed.  Constitutional:      Appearance: Normal appearance.  Cardiovascular:     Rate and Rhythm: Normal rate and regular rhythm.     Heart sounds: Normal heart sounds.  Pulmonary:     Effort: Pulmonary effort is normal.     Breath sounds: Normal breath sounds.  Abdominal:     General: Bowel sounds are normal.     Palpations: Abdomen is soft.     Tenderness: There is no abdominal tenderness.  Musculoskeletal:     Right wrist: Normal.     Left wrist: Swelling, effusion, laceration and tenderness present. No snuff box tenderness. Decreased range of motion. Normal pulse.       Arms:     Comments: Scratches noted all over dorsum of hand with one wound raised and red on the dorsal aspect of the wrist.  Neurological:     Mental Status: He is alert and oriented to person, place, and time.  Psychiatric:        Mood and Affect: Mood normal.        Behavior: Behavior normal.     Diabetic Foot Exam - Simple   No data filed      Lab Results  Component Value Date   WBC 4.8 10/03/2023   HGB 12.6 (L) 10/03/2023   HCT 37.8 10/03/2023   PLT 171 10/03/2023   GLUCOSE 74 10/03/2023   CHOL 179 10/03/2023   TRIG 63 10/03/2023   HDL 61 10/03/2023   LDLCALC 106 (H) 10/03/2023   ALT 9 10/03/2023   AST 13 10/03/2023   NA 140 10/03/2023   K 4.9 10/03/2023   CL 104 10/03/2023   CREATININE  1.36 (H) 10/03/2023   BUN 21 10/03/2023   CO2 24 10/03/2023   TSH 3.540 10/03/2023   HGBA1C 5.5 10/03/2023   Total time spent on today's visit was greater than 30 minutes, including both face-to-face time and nonface-to-face time personally spent on review of chart (labs and imaging), discussing labs and goals, discussing further work-up, treatment options, referrals to specialist if needed, reviewing outside records of pertinent, answering patient's questions, and coordinating care.    Assessment & Plan:    Cat bite of hand, left, subsequent encounter Assessment & Plan: Pain and limited movement in fingers following a cat bite. No signs of systemic infection. Concern for possible tendon involvement. -Start antibiotics today. -Order CT of the wrist to assess tendon involvement. -Use Tylenol  for pain management every 8 hours. -Monitor for worsening swelling, difficulty moving more fingers, and ensure good pulse in the hand. -Contact the office if symptoms worsen or do not improve after 1.5-2 days on antibiotics.  Orders: -     Azithromycin ; Take 1 tablet (500 mg total) by mouth daily.  Dispense: 10 tablet; Refill: 0 -     Bacitracin  Zinc ; Apply 1 Application topically 2 (two) times daily.  Dispense: 120 g; Refill: 0 -     CT WRIST LEFT WO CONTRAST; Future  Cellulitis of left hand Assessment & Plan: Begin antibiotic regiment Will follow up in 2 days if no symptoms improvement.   Orders: -     Azithromycin ;  Take 1 tablet (500 mg total) by mouth daily.  Dispense: 10 tablet; Refill: 0 -     Bacitracin  Zinc ; Apply 1 Application topically 2 (two) times daily.  Dispense: 120 g; Refill: 0     Meds ordered this encounter  Medications   azithromycin  (ZITHROMAX ) 500 MG tablet    Sig: Take 1 tablet (500 mg total) by mouth daily.    Dispense:  10 tablet    Refill:  0   bacitracin  ointment    Sig: Apply 1 Application topically 2 (two) times daily.    Dispense:  120 g    Refill:  0     Orders Placed This Encounter  Procedures   CT WRIST LEFT WO CONTRAST    General Health Maintenance -Encourage increased water intake.      Follow-up: No follow-ups on file.   I,Lauren M Auman,acting as a neurosurgeon for [employer], PA.,have documented all relevant documentation on the behalf of Lucas Angles, PA,as directed by  Lucas Angles, PA while in the presence of Lucas Kline, Lucas Kline.   An After Visit Summary was printed and given to the patient.  Lucas Kline, Lucas Kline [facility] [phone]

## 2023-10-18 NOTE — Assessment & Plan Note (Signed)
 Pain and limited movement in fingers following a cat bite. No signs of systemic infection. Concern for possible tendon involvement. -Start antibiotics today. -Order CT of the wrist to assess tendon involvement. -Use Tylenol  for pain management every 8 hours. -Monitor for worsening swelling, difficulty moving more fingers, and ensure good pulse in the hand. -Contact the office if symptoms worsen or do not improve after 1.5-2 days on antibiotics.

## 2023-10-18 NOTE — Assessment & Plan Note (Signed)
Begin antibiotic regiment Will follow up in 2 days if no symptoms improvement.

## 2023-10-20 ENCOUNTER — Ambulatory Visit: Payer: Self-pay | Admitting: Physician Assistant

## 2023-10-20 NOTE — Telephone Encounter (Addendum)
 Chief Complaint: Generalized weakness Symptoms: SOB, cough (chronic-tobacco chews) Pertinent Negatives: Patient denies fever, vomiting, diarrhea Disposition: [] ED /[] Urgent Care (no appt availability in office) / [] Appointment(In office/virtual)/ [x]  [place] [place] [facility]/ [] Home Care/ [] Refused Recommended Disposition /[] [place] [facility]/ []  Follow-up with PCP Additional Notes: Pt saw Craft PA on 2/4 for a cat bite on hand 1.5 week ago. Pt was told abx should improve symptoms in 2 days.  Pt still has swelling, redness and warmth has gone down. Pt still has pain in hand. Pt has progessive worsening weakness he thinks is due to abx. Pt not staying hydrated. Appt scheduled for tomorrow. This RN educated pt on home care, new-worsening symptoms, when to call back/seek emergent care. Pt verbalized understanding and agrees to plan.    Copied from CRM [id number]. Topic: Clinical - Red Word Triage >> Oct 20, 2023  4:31 PM Alfonso ORN wrote: Red Word that prompted transfer to Nurse Triage: seen Nola Crafty for a cat bite - 2 day really swollen and sore his whole hand and part of his ribs Reason for Disposition  [1] MODERATE weakness or fatigue AND [2] from poor fluid intake AND [3] no improvement after 2 hours of rest and fluids  [1] MODERATE weakness (i.e., interferes with work, school, normal activities) AND [2] persists > 3 days  Answer Assessment - Initial Assessment Questions 1. DESCRIPTION: Describe how you are feeling.     Weakness 5. NEW MEDICINES:  Have you started on any new medicines recently? (e.g., opioid pain medicines, benzodiazepines, muscle relaxants, antidepressants, antihistamines, neuroleptics, beta blockers)     Abx 6. OTHER SYMPTOMS: Do you have any other symptoms? (e.g., chest pain, fever, cough, SOB, vomiting, diarrhea, bleeding, other areas of pain)     SOB  Protocols used: Weakness (Generalized) and Fatigue-A-AH

## 2023-10-21 ENCOUNTER — Ambulatory Visit (INDEPENDENT_AMBULATORY_CARE_PROVIDER_SITE_OTHER): Payer: Medicare Other | Admitting: Physician Assistant

## 2023-10-21 ENCOUNTER — Encounter: Payer: Self-pay | Admitting: Physician Assistant

## 2023-10-21 VITALS — BP 102/68 | HR 76 | Temp 97.1°F | Ht 66.0 in | Wt 108.0 lb

## 2023-10-21 DIAGNOSIS — W5501XD Bitten by cat, subsequent encounter: Secondary | ICD-10-CM

## 2023-10-21 DIAGNOSIS — S61452D Open bite of left hand, subsequent encounter: Secondary | ICD-10-CM | POA: Diagnosis not present

## 2023-10-21 DIAGNOSIS — L03114 Cellulitis of left upper limb: Secondary | ICD-10-CM

## 2023-10-21 MED ORDER — AMOXICILLIN-POT CLAVULANATE 875-125 MG PO TABS: 1.0000 | ORAL_TABLET | Freq: Two times a day (BID) | ORAL | 0 refills | Status: DC

## 2023-10-21 NOTE — Patient Instructions (Signed)
 VISIT SUMMARY:  Today, we reviewed your condition following the cat bite on your wrist. We discussed the current state of your infection and your hydration status.  YOUR PLAN:  -CAT BITE INFECTION: A cat bite infection can occur when bacteria from a cat's mouth enter your body through a bite. Your redness and warmth have decreased, but there is still swelling and limited movement in your hand. We are stopping the azithromycin  and starting you on Augmentin  for 10 days to better target the bacteria from the cat bite. We will also consult with a hand surgeon to check if the bite has affected any tendons. It's important to keep your hand elevated and to do movement exercises to help reduce the swelling. If your hand's range of motion does not improve, we may consider hand physical therapy.  -DEHYDRATION: Dehydration happens when your body does not have enough fluids. To help with this, you should drink more fluids. You can use sugar-free Liquid IV or Pedialyte, and you can mix Pedialyte with other drinks to make it taste better.  INSTRUCTIONS:  Please follow up with the hand surgeon as soon as possible to assess for potential tendon involvement. Continue taking Augmentin  for 10 days as prescribed. Keep your hand elevated and perform movement exercises regularly. Increase your fluid intake and consider using sugar-free Liquid IV or Pedialyte, mixing it with other drinks if needed for better taste.

## 2023-10-21 NOTE — Progress Notes (Signed)
 Acute Office Visit  Subjective:    Patient ID: Lucas Kline, male    DOB: Mar 30, 1929, 89 y.o.   MRN: [id number]  Chief Complaint  Patient presents with   Follow up Left hand    Discussed the use of AI scribe software for clinical note transcription with the patient, who gave verbal consent to proceed.  HPI: Patient is in today for follow up for cat bite.  Discussed the use of AI scribe software for clinical note transcription with the patient, who gave verbal consent to proceed.  History of Present Illness   A 89 year old patient presents for follow-up after a cat bite on the dorsal aspect of his wrist. The patient has been on azithromycin  since Tuesday. The patient's caregiver reports some improvement in redness and warmth, but the patient still has decreased strength and range of motion in the hand. The patient has been keeping the hand down a lot, which may be contributing to the swelling. The patient has been taking liquid IV for possible dehydration. The patient's caregiver is concerned about the patient's hydration and the patient's ability to move his hand.       Past Medical History:  Diagnosis Date   Adult BMI <19 kg/sq m 01/21/2020   Alzheimer disease (HCC)    Atherosclerotic heart disease of native coronary artery without angina pectoris    CHRONIC    Atrial fibrillation (HCC) 06/03/2022   B12 deficiency    CHRONIC   Bilateral carotid bruits 01/28/2020   Chronic atrial fibrillation (HCC) 01/21/2020   CKD (chronic kidney disease), stage II    MILD   CKD (chronic kidney disease), stage III (HCC)    MODERATE (CHRONIC)   Contusion of left knee 09/28/2021   Coronary atherosclerosis    CHRONIC   Essential hypertension 01/28/2020   Essential hypertension, benign    Essential tremor    CHRONIC   Malnutrition of moderate degree (HCC) 01/21/2020   Memory deficit 09/08/2020   Mixed hearing loss, bilateral    CHRONIC   Mixed hyperlipidemia    CHRONIC     Neck pain 07/01/2021   Obstructive chronic bronchitis without exacerbation (HCC) 01/21/2020   Osteoarthritis    OF KNEE CHRONIC   Personal history of malignant neoplasm of larynx 01/18/2020   Primary hypothyroidism 01/22/2020   Unstable gait 01/21/2020    Past Surgical History:  Procedure Laterality Date   CATARACT EXTRACTION, BILATERAL      Family History  Problem Relation Age of Onset   Hypertension Neg Hx    Heart disease Neg Hx    Cancer Neg Hx    Diabetes Neg Hx     Social History   Socioeconomic History   Marital status: Widowed    Spouse name: Not on file   Number of children: 2   Years of education: Not on file   Highest education level: Not on file  Occupational History   Occupation: retired   Occupation: warehouse manager  Tobacco Use   Smoking status: Former    Current packs/day: 0.00    Types: Cigarettes    Quit date: 2008    Years since quitting: 17.1   Smokeless tobacco: Current    Types: Chew   Tobacco comments:    1 pouch sometimes  Vaping Use   Vaping status: Never Used  Substance and Sexual Activity   Alcohol  use: No   Drug use: No   Sexual activity: Yes  Other Topics Concern   Not on file  Social History Narrative   Not on file   Social Drivers of Health   Financial Resource Strain: Low Risk  (04/04/2023)   Overall Financial Resource Strain (CARDIA)    Difficulty of Paying Living Expenses: Not hard at all  Food Insecurity: No Food Insecurity (04/04/2023)   Hunger Vital Sign    Worried About Running Out of Food in the Last Year: Never true    Ran Out of Food in the Last Year: Never true  Transportation Needs: No Transportation Needs (04/04/2023)   PRAPARE - Administrator, Civil Service (Medical): No    Lack of Transportation (Non-Medical): No  Physical Activity: Sufficiently Active (04/04/2023)   Exercise Vital Sign    Days of Exercise per Week: 5 days    Minutes of Exercise per Session: 60 min  Stress: No Stress Concern  Present (04/04/2023)   [employer] of Occupational Health - Occupational Stress Questionnaire    Feeling of Stress : Not at all  Social Connections: Moderately Isolated (04/04/2023)   Social Connection and Isolation Panel [NHANES]    Frequency of Communication with Friends and Family: Once a week    Frequency of Social Gatherings with Friends and Family: More than three times a week    Attends Religious Services: Never    Database Administrator or Organizations: No    Attends Engineer, Structural: More than 4 times per year    Marital Status: Widowed  Intimate Partner Violence: Not At Risk (04/04/2023)   Humiliation, Afraid, Rape, and Kick questionnaire    Fear of Current or Ex-Partner: No    Emotionally Abused: No    Physically Abused: No    Sexually Abused: No    Outpatient Medications Prior to Visit  Medication Sig Dispense Refill   azithromycin  (ZITHROMAX ) 500 MG tablet Take 1 tablet (500 mg total) by mouth daily. 10 tablet 0   bacitracin  ointment Apply 1 Application topically 2 (two) times daily. 120 g 0   levothyroxine  (SYNTHROID ) 50 MCG tablet TAKE 1 TABLET BY MOUTH EVERY DAY 90 tablet 1   memantine  (NAMENDA ) 10 MG tablet TAKE 1 TABLET BY MOUTH TWICE A DAY 60 tablet 1   simvastatin  (ZOCOR ) 20 MG tablet TAKE 1 TABLET BY MOUTH EVERY DAY 90 tablet 2   verapamil  (CALAN -SR) 240 MG CR tablet TAKE 1 TABLET BY MOUTH EVERY DAY 90 tablet 1   amoxicillin -clavulanate (AUGMENTIN ) 875-125 MG tablet Take 1 tablet by mouth 2 (two) times daily.     amoxicillin -clavulanate (AUGMENTIN ) 875-125 MG tablet Take 1 tablet by mouth 2 (two) times daily.     apixaban  (ELIQUIS ) 2.5 MG TABS tablet Take 1 tablet (2.5 mg total) by mouth 2 (two) times daily. (Patient not taking: Reported on 10/03/2023) 180 tablet 3   No facility-administered medications prior to visit.    Allergies  Allergen Reactions   Aricept [Donepezil] Other (See Comments)    Abdominal pain, diarrhea    Review of  Systems  Constitutional:  Negative for appetite change, fatigue and fever.  HENT:  Negative for congestion, ear pain, sinus pressure and sore throat.   Respiratory:  Negative for cough, chest tightness, shortness of breath and wheezing.   Cardiovascular:  Negative for chest pain and palpitations.  Gastrointestinal:  Negative for abdominal pain, constipation, diarrhea, nausea and vomiting.  Genitourinary:  Negative for dysuria and hematuria.  Musculoskeletal:  Positive for myalgias (Left hand pain with swelling). Negative for arthralgias, back pain and joint swelling.  Skin:  Negative for rash.  Neurological:  Negative for dizziness, weakness and headaches.  Psychiatric/Behavioral:  Negative for dysphoric mood. The patient is not nervous/anxious.        Objective:        10/21/2023    8:26 AM 10/18/2023    1:29 PM 10/03/2023    2:40 PM  Vitals with BMI  Height 5' 6 5' 6 5' 6  Weight 108 lbs 106 lbs 103 lbs  BMI 17.44 17.12 16.63  Systolic 102 90 128  Diastolic 68 62 68  Pulse 76 72 66    No data found.    Physical Exam Vitals reviewed.  Constitutional:      Appearance: Normal appearance.  Cardiovascular:     Rate and Rhythm: Normal rate and regular rhythm.     Heart sounds: Normal heart sounds.  Pulmonary:     Effort: Pulmonary effort is normal.     Breath sounds: Normal breath sounds.  Abdominal:     General: Bowel sounds are normal.     Palpations: Abdomen is soft.     Tenderness: There is no abdominal tenderness.  Musculoskeletal:        General: Swelling and tenderness present.     Left wrist: Swelling, laceration and tenderness present. Decreased range of motion.     Left hand: Swelling and tenderness present. Decreased range of motion. Decreased strength of wrist extension. Normal strength of finger abduction. Normal sensation. Normal capillary refill. Normal pulse.  Neurological:     Mental Status: He is alert and oriented to person, place, and time.   Psychiatric:        Mood and Affect: Mood normal.        Behavior: Behavior normal.     There are no preventive care reminders to display for this patient.  There are no preventive care reminders to display for this patient.   Lab Results  Component Value Date   TSH 3.540 10/03/2023   Lab Results  Component Value Date   WBC 4.8 10/03/2023   HGB 12.6 (L) 10/03/2023   HCT 37.8 10/03/2023   MCV 95 10/03/2023   PLT 171 10/03/2023   Lab Results  Component Value Date   NA 140 10/03/2023   K 4.9 10/03/2023   CO2 24 10/03/2023   GLUCOSE 74 10/03/2023   BUN 21 10/03/2023   CREATININE 1.36 (H) 10/03/2023   BILITOT 0.3 10/03/2023   ALKPHOS 62 10/03/2023   AST 13 10/03/2023   ALT 9 10/03/2023   PROT 6.3 10/03/2023   ALBUMIN 4.0 10/03/2023   CALCIUM 9.2 10/03/2023   ANIONGAP 9 02/12/2019   EGFR 49 (L) 10/03/2023   Lab Results  Component Value Date   CHOL 179 10/03/2023   Lab Results  Component Value Date   HDL 61 10/03/2023   Lab Results  Component Value Date   LDLCALC 106 (H) 10/03/2023   Lab Results  Component Value Date   TRIG 63 10/03/2023   Lab Results  Component Value Date   CHOLHDL 2.9 10/03/2023   Lab Results  Component Value Date   HGBA1C 5.5 10/03/2023       Assessment & Plan:  Cellulitis of left hand Assessment & Plan: Decreased redness and warmth Continue to monitor swelling Discontinue Azithromycin  Start Augmentin  Will schedule MRI if he continues to have difficulty moving it.  Orders: -     Amoxicillin -Pot Clavulanate; Take 1 tablet by mouth 2 (two) times daily.  Dispense: 20 tablet; Refill: 0  Cat bite  of hand, left, subsequent encounter Assessment & Plan: Decreased redness and warmth, but persistent swelling and decreased range of motion in the hand. Concern for potential tendon involvement from the bite. Discussed the need for appropriate antibiotic coverage for cat bite organisms and the importance of elevation and movement for  swelling reduction. -Discontinue Azithromycin . -Start Augmentin  for 10 days. -Consult with hand surgeon to assess for potential tendon involvement. -Encourage elevation and movement exercises for the hand. -Consider hand physical therapy if no improvement in range of motion.  Orders: -     Amoxicillin -Pot Clavulanate; Take 1 tablet by mouth 2 (two) times daily.  Dispense: 20 tablet; Refill: 0     Meds ordered this encounter  Medications   amoxicillin -clavulanate (AUGMENTIN ) 875-125 MG tablet    Sig: Take 1 tablet by mouth 2 (two) times daily.    Dispense:  20 tablet    Refill:  0    No orders of the defined types were placed in this encounter.   Dehydration Possible dehydration based on symptoms. -Encourage increased fluid intake, consider sugar-free Liquid IV or Pedialyte. -Advise on mixing Pedialyte with other drinks for better taste acceptance.       Follow-up: No follow-ups on file.  An After Visit Summary was printed and given to the patient.   I,Lauren M Auman,acting as a neurosurgeon for [employer], PA.,have documented all relevant documentation on the behalf of Nola Angles, PA,as directed by  Nola Angles, PA while in the presence of Nola Angles, [place].    Nola Angles, [place] [facility] [phone]

## 2023-10-25 NOTE — Assessment & Plan Note (Signed)
Decreased redness and warmth, but persistent swelling and decreased range of motion in the hand. Concern for potential tendon involvement from the bite. Discussed the need for appropriate antibiotic coverage for cat bite organisms and the importance of elevation and movement for swelling reduction. -Discontinue Azithromycin. -Start Augmentin for 10 days. -Consult with hand surgeon to assess for potential tendon involvement. -Encourage elevation and movement exercises for the hand. -Consider hand physical therapy if no improvement in range of motion.

## 2023-10-25 NOTE — Assessment & Plan Note (Signed)
Decreased redness and warmth Continue to monitor swelling Discontinue Azithromycin Start Augmentin Will schedule MRI if he continues to have difficulty moving it.

## 2023-11-06 ENCOUNTER — Other Ambulatory Visit: Payer: Self-pay | Admitting: Physician Assistant

## 2023-11-06 DIAGNOSIS — E039 Hypothyroidism, unspecified: Secondary | ICD-10-CM

## 2023-12-14 ENCOUNTER — Other Ambulatory Visit: Payer: Self-pay | Admitting: Physician Assistant

## 2023-12-14 DIAGNOSIS — F0280 Dementia in other diseases classified elsewhere without behavioral disturbance: Secondary | ICD-10-CM

## 2024-01-12 ENCOUNTER — Other Ambulatory Visit: Payer: Self-pay | Admitting: Physician Assistant

## 2024-01-12 DIAGNOSIS — I4891 Unspecified atrial fibrillation: Secondary | ICD-10-CM

## 2024-01-12 NOTE — Telephone Encounter (Signed)
 Copied from CRM [id number]. Topic: Clinical - Medication Refill >> Jan 12, 2024  1:07 PM Felizardo Hotter wrote: Most Recent Primary Care Visit:  Provider: Odilia Bennett  Department: [facility]  Visit Type: ACUTE  Date: 10/21/2023  Medication: apixaban  (ELIQUIS ) 2.5 MG TABS tablet  Has the patient contacted their pharmacy? Yes (Agent: If no, request that the patient contact the pharmacy for the refill. If patient does not wish to contact the pharmacy document the reason why and proceed with request.) (Agent: If yes, when and what did the pharmacy advise?)  Is this the correct pharmacy for this prescription? Yes If no, delete pharmacy and type the correct one.  This is the patient's preferred pharmacy:  [id number] - [place], [place] - [address] AT [facility] [address] [place] [address] Phone: [phone] Fax: [phone]   Has the prescription been filled recently? Yes  Is the patient out of the medication? Yes  Has the patient been seen for an appointment in the last year OR does the patient have an upcoming appointment? Yes  Can we respond through MyChart? Yes  Agent: Please be advised that Rx refills may take up to 3 business days. We ask that you follow-up with your pharmacy.

## 2024-01-16 MED ORDER — APIXABAN 2.5 MG PO TABS: 2.5000 mg | ORAL_TABLET | Freq: Two times a day (BID) | ORAL | 3 refills | Status: DC

## 2024-01-18 ENCOUNTER — Encounter: Payer: Self-pay | Admitting: Physician Assistant

## 2024-01-20 ENCOUNTER — Other Ambulatory Visit: Payer: Self-pay | Admitting: Physician Assistant

## 2024-01-20 DIAGNOSIS — I482 Chronic atrial fibrillation, unspecified: Secondary | ICD-10-CM

## 2024-02-07 ENCOUNTER — Telehealth: Payer: Self-pay

## 2024-02-07 NOTE — Progress Notes (Signed)
   02/07/2024  Patient ID: Lucas Kline, male   DOB: 1928-09-15, 89 y.o.   MRN: [id number]  Contacted patient regarding referral for medication access from [place], [place] .   Left patient a voicemail to return my call at their convenience  Was hoping to speak with dtr Bridgette Campus regarding expenses associated with eliquis . I have reviewed [facility] plan information. Patient has $250 annual Rx deductible for Tier 3/4/5 medications, including eliquis . Once $250 is met the monthly copay is $47.   If this does not seem feasible, can go ahead with Eliquis  patient assistance application, however will need to have spent 3% of annual household income year-to-date on his prescription medications overall and provide proof of this through pharmacy copay report or EOB through insurance.   Rolando Cliche, PharmD, BCGP Clinical Pharmacist  [phone]

## 2024-02-10 ENCOUNTER — Other Ambulatory Visit: Payer: Self-pay

## 2024-02-10 ENCOUNTER — Telehealth: Payer: Self-pay

## 2024-02-10 NOTE — Progress Notes (Signed)
   02/10/2024  Patient ID: Lucas Kline, male   DOB: 11/23/1928, 89 y.o.   MRN: [id number]   Spoke with Dtr Bridgette Campus regarding Eliquis  Patient Assistance and insurance plan befits.  Through the insurance - $250 Rx deductible then $47/month copay.  Through [employer] patient assistance program - needs to have spent 3% of annual household income on Rx medications since 09/14/2023. Elected to have papers sent to her house and will further review. Understands that they would need to pick up a copay report from the pharmacy.   Rolando Cliche, PharmD, BCGP Clinical Pharmacist  [phone]

## 2024-02-10 NOTE — Telephone Encounter (Signed)
 PAP: Patient assistance application for Eliquis  through [employer] ([employer]) has been mailed to pt's home address on file. Provider portion of application will be faxed to provider's office.  Provider portion of application has been faxed to [facility]

## 2024-02-13 NOTE — Telephone Encounter (Addendum)
 Received provider forms for patient assistance application

## 2024-02-13 NOTE — Telephone Encounter (Signed)
 Coming in June 10 th, at 0820 per daughter's request.   Copied from CRM [id number]. Topic: Clinical - Medical Advice >> Feb 13, 2024  8:39 AM Everette C wrote: Reason for CRM: The patient's daughter would like to be contacted by a member of practice clinical staff when possible to discuss possibly moving the patient's appointment up due to off/on walking concerns   The patient's daughter has stressed that this concern is not urgent

## 2024-02-20 NOTE — Progress Notes (Unsigned)
 Acute Office Visit  Subjective:    Patient ID: Lucas Kline, male    DOB: [birth year], 89 y.o.   MRN: [id number]  No chief complaint on file.   HPI: Patient is in today for ***  Past Medical History:  Diagnosis Date   Adult BMI <19 kg/sq m 01/21/2020   Alzheimer disease (HCC)    Atherosclerotic heart disease of native coronary artery without angina pectoris    CHRONIC    Atrial fibrillation (HCC) 06/03/2022   B12 deficiency    CHRONIC   Bilateral carotid bruits 01/28/2020   Chronic atrial fibrillation (HCC) 01/21/2020   CKD (chronic kidney disease), stage II    MILD   CKD (chronic kidney disease), stage III (HCC)    MODERATE (CHRONIC)   Contusion of left knee 09/28/2021   Coronary atherosclerosis    CHRONIC   Essential hypertension 01/28/2020   Essential hypertension, benign    Essential tremor    CHRONIC   Malnutrition of moderate degree (HCC) 01/21/2020   Memory deficit 09/08/2020   Mixed hearing loss, bilateral    CHRONIC   Mixed hyperlipidemia    CHRONIC    Neck pain 07/01/2021   Obstructive chronic bronchitis without exacerbation (HCC) 01/21/2020   Osteoarthritis    OF KNEE CHRONIC   Personal history of malignant neoplasm of larynx 01/18/2020   Primary hypothyroidism 01/22/2020   Unstable gait 01/21/2020    Past Surgical History:  Procedure Laterality Date   CATARACT EXTRACTION, BILATERAL      Family History  Problem Relation Age of Onset   Hypertension Neg Hx    Heart disease Neg Hx    Cancer Neg Hx    Diabetes Neg Hx     Social History   Socioeconomic History   Marital status: Widowed    Spouse name: Not on file   Number of children: 2   Years of education: Not on file   Highest education level: Not on file  Occupational History   Occupation: retired   Occupation: Warehouse manager  Tobacco Use   Smoking status: Former    Current packs/day: 0.00    Types: Cigarettes    Quit date: 2008    Years since quitting: 17.4    Smokeless tobacco: Current    Types: Chew   Tobacco comments:    1 pouch sometimes  Vaping Use   Vaping status: Never Used  Substance and Sexual Activity   Alcohol use: No   Drug use: No   Sexual activity: Yes  Other Topics Concern   Not on file  Social History Narrative   Not on file   Social Drivers of Health   Financial Resource Strain: Low Risk  (04/04/2023)   Overall Financial Resource Strain (CARDIA)    Difficulty of Paying Living Expenses: Not hard at all  Food Insecurity: No Food Insecurity (04/04/2023)   Hunger Vital Sign    Worried About Running Out of Food in the Last Year: Never true    Ran Out of Food in the Last Year: Never true  Transportation Needs: No Transportation Needs (04/04/2023)   PRAPARE - Administrator, Civil Service (Medical): No    Lack of Transportation (Non-Medical): No  Physical Activity: Sufficiently Active (04/04/2023)   Exercise Vital Sign    Days of Exercise per Week: 5 days    Minutes of Exercise per Session: 60 min  Stress: No Stress Concern Present (04/04/2023)   [employer] of Occupational Health - Occupational Stress  Questionnaire    Feeling of Stress : Not at all  Social Connections: Moderately Isolated (04/04/2023)   Social Connection and Isolation Panel [NHANES]    Frequency of Communication with Friends and Family: Once a week    Frequency of Social Gatherings with Friends and Family: More than three times a week    Attends Religious Services: Never    Database administrator or Organizations: No    Attends Engineer, structural: More than 4 times per year    Marital Status: Widowed  Intimate Partner Violence: Not At Risk (04/04/2023)   Humiliation, Afraid, Rape, and Kick questionnaire    Fear of Current or Ex-Partner: No    Emotionally Abused: No    Physically Abused: No    Sexually Abused: No    Outpatient Medications Prior to Visit  Medication Sig Dispense Refill   amoxicillin -clavulanate (AUGMENTIN )  875-125 MG tablet Take 1 tablet by mouth 2 (two) times daily. 20 tablet 0   apixaban  (ELIQUIS ) 2.5 MG TABS tablet Take 1 tablet (2.5 mg total) by mouth 2 (two) times daily. 180 tablet 3   azithromycin  (ZITHROMAX ) 500 MG tablet Take 1 tablet (500 mg total) by mouth daily. 10 tablet 0   bacitracin  ointment Apply 1 Application topically 2 (two) times daily. 120 g 0   levothyroxine  (SYNTHROID ) 50 MCG tablet TAKE 1 TABLET BY MOUTH EVERY DAY 90 tablet 1   memantine  (NAMENDA ) 10 MG tablet TAKE 1 TABLET BY MOUTH TWICE A DAY 180 tablet 1   simvastatin  (ZOCOR ) 20 MG tablet TAKE 1 TABLET BY MOUTH EVERY DAY 90 tablet 2   verapamil  (CALAN -SR) 240 MG CR tablet TAKE 1 TABLET BY MOUTH EVERY DAY 90 tablet 1   No facility-administered medications prior to visit.    Allergies  Allergen Reactions   Aricept [Donepezil] Other (See Comments)    Abdominal pain, diarrhea    Review of Systems  Constitutional:  Negative for appetite change, fatigue and fever.  HENT:  Negative for congestion, ear pain, sinus pressure and sore throat.   Respiratory:  Negative for cough, chest tightness, shortness of breath and wheezing.   Cardiovascular:  Negative for chest pain and palpitations.  Gastrointestinal:  Negative for abdominal pain, constipation, diarrhea, nausea and vomiting.  Genitourinary:  Negative for dysuria and hematuria.  Musculoskeletal:  Negative for arthralgias, back pain, joint swelling and myalgias.  Skin:  Negative for rash.  Neurological:  Negative for dizziness, weakness and headaches.  Psychiatric/Behavioral:  Negative for dysphoric mood. The patient is not nervous/anxious.        Objective:         10/21/2023    8:26 AM 10/18/2023    1:29 PM 10/03/2023    2:40 PM  Vitals with BMI  Height 5\' 6"  5\' 6"  5\' 6"   Weight 108 lbs 106 lbs 103 lbs  BMI 17.44 17.12 16.63  Systolic 102 90 128  Diastolic 68 62 68  Pulse 76 72 66    No data found.   Physical Exam  There are no preventive care  reminders to display for this patient.  There are no preventive care reminders to display for this patient.   Lab Results  Component Value Date   TSH 3.540 10/03/2023   Lab Results  Component Value Date   WBC 4.8 10/03/2023   HGB 12.6 (L) 10/03/2023   HCT 37.8 10/03/2023   MCV 95 10/03/2023   PLT 171 10/03/2023   Lab Results  Component Value Date  NA 140 10/03/2023   K 4.9 10/03/2023   CO2 24 10/03/2023   GLUCOSE 74 10/03/2023   BUN 21 10/03/2023   CREATININE 1.36 (H) 10/03/2023   BILITOT 0.3 10/03/2023   ALKPHOS 62 10/03/2023   AST 13 10/03/2023   ALT 9 10/03/2023   PROT 6.3 10/03/2023   ALBUMIN 4.0 10/03/2023   CALCIUM 9.2 10/03/2023   ANIONGAP 9 02/12/2019   EGFR 49 (L) 10/03/2023   Lab Results  Component Value Date   CHOL 179 10/03/2023   Lab Results  Component Value Date   HDL 61 10/03/2023   Lab Results  Component Value Date   LDLCALC 106 (H) 10/03/2023   Lab Results  Component Value Date   TRIG 63 10/03/2023   Lab Results  Component Value Date   CHOLHDL 2.9 10/03/2023   Lab Results  Component Value Date   HGBA1C 5.5 10/03/2023       Assessment & Plan:  There are no diagnoses linked to this encounter.   No orders of the defined types were placed in this encounter.   No orders of the defined types were placed in this encounter.    Follow-up: No follow-ups on file.  An After Visit Summary was printed and given to the patient.  Odilia Bennett, [facility] [phone]

## 2024-02-21 ENCOUNTER — Encounter: Payer: Self-pay | Admitting: Physician Assistant

## 2024-02-21 ENCOUNTER — Ambulatory Visit (INDEPENDENT_AMBULATORY_CARE_PROVIDER_SITE_OTHER): Admitting: Physician Assistant

## 2024-02-21 VITALS — BP 104/56 | HR 77 | Temp 97.5°F | Resp 14 | Ht 66.0 in | Wt 105.0 lb

## 2024-02-21 DIAGNOSIS — E440 Moderate protein-calorie malnutrition: Secondary | ICD-10-CM

## 2024-02-21 DIAGNOSIS — J441 Chronic obstructive pulmonary disease with (acute) exacerbation: Secondary | ICD-10-CM | POA: Insufficient documentation

## 2024-02-21 DIAGNOSIS — E039 Hypothyroidism, unspecified: Secondary | ICD-10-CM

## 2024-02-21 DIAGNOSIS — I10 Essential (primary) hypertension: Secondary | ICD-10-CM

## 2024-02-21 DIAGNOSIS — N1831 Chronic kidney disease, stage 3a: Secondary | ICD-10-CM

## 2024-02-21 DIAGNOSIS — N182 Chronic kidney disease, stage 2 (mild): Secondary | ICD-10-CM

## 2024-02-21 DIAGNOSIS — E538 Deficiency of other specified B group vitamins: Secondary | ICD-10-CM

## 2024-02-21 DIAGNOSIS — I482 Chronic atrial fibrillation, unspecified: Secondary | ICD-10-CM

## 2024-02-21 DIAGNOSIS — G309 Alzheimer's disease, unspecified: Secondary | ICD-10-CM

## 2024-02-21 DIAGNOSIS — R413 Other amnesia: Secondary | ICD-10-CM

## 2024-02-21 DIAGNOSIS — F0280 Dementia in other diseases classified elsewhere without behavioral disturbance: Secondary | ICD-10-CM

## 2024-02-21 DIAGNOSIS — R2681 Unsteadiness on feet: Secondary | ICD-10-CM

## 2024-02-21 LAB — POCT URINALYSIS DIP (CLINITEK)
Blood, UA: NEGATIVE
Glucose, UA: NEGATIVE mg/dL
Ketones, POC UA: NEGATIVE mg/dL
Leukocytes, UA: NEGATIVE
Nitrite, UA: NEGATIVE
POC PROTEIN,UA: NEGATIVE
Spec Grav, UA: >=1.03 — AB (ref 1.010–1.025)
Urobilinogen, UA: 0.2 U/dL
pH, UA: 6 (ref 5.0–8.0)

## 2024-02-21 NOTE — Assessment & Plan Note (Signed)
 Controlled Denies any worsening or changing in BP at home Continue to take Verapamil  250mg  as prescribed  BP Readings from Last 3 Encounters:  02/21/24 (!) 104/56  10/21/23 102/68  10/18/23 90/62

## 2024-02-21 NOTE — Assessment & Plan Note (Signed)
 Labs drawn today Continue to take supplement as prescribed Will adjust treatment depending on results

## 2024-02-21 NOTE — Assessment & Plan Note (Signed)
 Atrial fibrillation with increased stroke risk. On Eliquis  for stroke prevention. Insurance coverage discussed. - Ensure Eliquis  continuation. -Currently working with pharmacist to help with cost

## 2024-02-21 NOTE — Assessment & Plan Note (Signed)
 Continue to eat 2 balanced meals  Supplement with protein shakes between meals Continue to monitor weight

## 2024-02-21 NOTE — Assessment & Plan Note (Signed)
 COPD with history of aspiration pneumonia. Lung vibration device use discussed. - Encourage regular use of lung vibration device.

## 2024-02-21 NOTE — Patient Instructions (Signed)
 VISIT SUMMARY:  Today, we discussed your walking difficulties and episodes of weakness. We also reviewed your Alzheimer's disease, atrial fibrillation, COPD, and nutritional intake. We have made several plans to address these issues and ensure your overall health and well-being.  YOUR PLAN:  -GAIT DISTURBANCE: You have been experiencing difficulties with walking, which may be due to neurological conditions. We will refer you to physical therapy for gait training and strengthening exercises, and consider occupational therapy to help with daily activities. Additionally, we will order brain imaging to assess your neurological health.  -ALZHEIMER'S DISEASE: Alzheimer's disease is a condition that affects memory and cognitive function. We will order brain imaging to monitor the progression of your Alzheimer's and consider referring you to a neurologist for further management.  -ATRIAL FIBRILLATION: Atrial fibrillation is an irregular heart rhythm that increases the risk of stroke. You are currently taking Eliquis  to prevent strokes. We will ensure you continue taking Eliquis  and assist with any insurance coverage issues.  -COPD: Chronic Obstructive Pulmonary Disease (COPD) affects your lungs and breathing. You have a history of aspiration pneumonia, and we encourage you to regularly use the lung vibration device to help keep your lungs clear.  -ELEVATED BILIRUBIN: Elevated bilirubin levels can indicate a liver issue or dehydration. We will order blood work to assess your liver function.  -DEHYDRATION RISK: Dehydration can worsen your other health issues. We discussed the possibility of IV hydration therapy if signs of dehydration occur and encourage you to increase your water intake, preferring non-ice cold water.  -GENERAL HEALTH MAINTENANCE: We discussed the importance of maintaining good nutrition and hydration. We encourage you to eat appealing foods to prevent malnutrition and consider using  colored plates to make meals more enjoyable.  INSTRUCTIONS:  Please follow up with the recommended physical therapy and occupational therapy for your walking difficulties. Ensure you get the brain imaging done as ordered. Continue taking Eliquis  and let us  know if you have any issues with insurance coverage. Use the lung vibration device regularly to help with your COPD. Increase your water intake and monitor for any signs of dehydration. If you experience any new or worsening symptoms, please contact our office.

## 2024-02-21 NOTE — Assessment & Plan Note (Signed)
 Worsening over the past few months Denies any recent falls Told to use larger walking stick compared to short gain from a branch he "found in the woods"

## 2024-02-21 NOTE — Assessment & Plan Note (Signed)
 Admits to worsening symptoms Daughter states he has a hard time remembering when he took medicines Will order MRI to further evaluate

## 2024-02-21 NOTE — Assessment & Plan Note (Signed)
 Controlled Denies any new or worsening symptoms  Labs drawn today Will adjust treatment depending on results

## 2024-02-21 NOTE — Assessment & Plan Note (Signed)
 Controlled Continue to monitor fluid intake Labs drawn today Will adjust treatment depending on labs

## 2024-02-21 NOTE — Assessment & Plan Note (Signed)
 Alzheimer's with increased forgetfulness. Imaging needed for baseline and progression monitoring. - Order brain imaging for Alzheimer's assessment. - Consider neurologist referral for further management.

## 2024-02-22 ENCOUNTER — Ambulatory Visit: Payer: Self-pay | Admitting: Physician Assistant

## 2024-02-22 LAB — COMPREHENSIVE METABOLIC PANEL WITH GFR
ALT: 8 IU/L (ref 0–44)
AST: 16 IU/L (ref 0–40)
Albumin: 4.1 g/dL (ref 3.6–4.6)
Alkaline Phosphatase: 64 IU/L (ref 44–121)
BUN/Creatinine Ratio: 17 (ref 10–24)
BUN: 22 mg/dL (ref 10–36)
Bilirubin Total: 0.4 mg/dL (ref 0.0–1.2)
CO2: 19 mmol/L — ABNORMAL LOW (ref 20–29)
Calcium: 9.2 mg/dL (ref 8.6–10.2)
Chloride: 105 mmol/L (ref 96–106)
Creatinine, Ser: 1.33 mg/dL — ABNORMAL HIGH (ref 0.76–1.27)
Globulin, Total: 2.4 g/dL (ref 1.5–4.5)
Glucose: 81 mg/dL (ref 70–99)
Potassium: 3.9 mmol/L (ref 3.5–5.2)
Sodium: 141 mmol/L (ref 134–144)
Total Protein: 6.5 g/dL (ref 6.0–8.5)
eGFR: 50 mL/min/{1.73_m2} — ABNORMAL LOW (ref >59)

## 2024-02-22 LAB — CBC WITH DIFFERENTIAL/PLATELET
Basophils Absolute: 0.1 10*3/uL (ref 0.0–0.2)
Basos: 1 %
EOS (ABSOLUTE): 0.3 10*3/uL (ref 0.0–0.4)
Eos: 5 %
Hematocrit: 37.9 % (ref 37.5–51.0)
Hemoglobin: 12.8 g/dL — ABNORMAL LOW (ref 13.0–17.7)
Immature Grans (Abs): 0 10*3/uL (ref 0.0–0.1)
Immature Granulocytes: 0 %
Lymphocytes Absolute: 1.3 10*3/uL (ref 0.7–3.1)
Lymphs: 24 %
MCH: 32.9 pg (ref 26.6–33.0)
MCHC: 33.8 g/dL (ref 31.5–35.7)
MCV: 97 fL (ref 79–97)
Monocytes Absolute: 0.5 10*3/uL (ref 0.1–0.9)
Monocytes: 9 %
Neutrophils Absolute: 3.4 10*3/uL (ref 1.4–7.0)
Neutrophils: 61 %
Platelets: 174 10*3/uL (ref 150–450)
RBC: 3.89 x10E6/uL — ABNORMAL LOW (ref 4.14–5.80)
RDW: 12.8 % (ref 11.6–15.4)
WBC: 5.6 10*3/uL (ref 3.4–10.8)

## 2024-02-22 LAB — VITAMIN D 25 HYDROXY (VIT D DEFICIENCY, FRACTURES): Vit D, 25-Hydroxy: 19 ng/mL — ABNORMAL LOW (ref 30.0–100.0)

## 2024-02-22 LAB — IRON,TIBC AND FERRITIN PANEL
Ferritin: 92 ng/mL (ref 30–400)
Iron Saturation: 45 % (ref 15–55)
Iron: 112 ug/dL (ref 38–169)
Total Iron Binding Capacity: 251 ug/dL (ref 250–450)
UIBC: 139 ug/dL (ref 111–343)

## 2024-02-22 LAB — TSH: TSH: 3.85 u[IU]/mL (ref 0.450–4.500)

## 2024-02-24 NOTE — Telephone Encounter (Signed)
 Left HIPAA compliant voice mail for patient regarding application for Eliquis  that was mailed to the patients home on 5/30

## 2024-02-28 ENCOUNTER — Other Ambulatory Visit

## 2024-03-01 NOTE — Telephone Encounter (Signed)
 Second attempt to contact patient regarding patient assistance application mailed 5/30.  A HIPAA compliant voice mail was left for patient to return my call.

## 2024-03-05 ENCOUNTER — Telehealth: Payer: Self-pay

## 2024-03-05 NOTE — Telephone Encounter (Signed)
 Copied from CRM [id number]. Topic: Clinical - Medical Advice >> Mar 02, 2024 11:08 AM Rosaria BRAVO wrote: Reason for CRM: Question about MRI and PT.   Wants to discuss alternative options without MRI  Requesting a referral for [facility] instead of the current location because is not seeing outside patients.   Best contact: [phone] Jesusa)

## 2024-03-08 ENCOUNTER — Telehealth: Payer: Self-pay

## 2024-03-08 NOTE — Progress Notes (Signed)
 Complex Care Management Note Care Guide Note  03/08/2024 Name: Lucas Kline MRN: [id number] DOB: Apr 17, 1929   Complex Care Management Outreach Attempts: An unsuccessful telephone outreach was attempted today to offer the patient information about available complex care management services.  Follow Up Plan:  Additional outreach attempts will be made to offer the patient complex care management information and services.   Encounter Outcome:  No Answer  Dreama [facility]  [facility], [facility] Health Care Management Assistant Direct Dial: [phone]  Fax: [phone]

## 2024-03-09 ENCOUNTER — Other Ambulatory Visit: Payer: Self-pay | Admitting: Cardiology

## 2024-03-09 ENCOUNTER — Other Ambulatory Visit: Payer: Self-pay | Admitting: Physician Assistant

## 2024-03-09 DIAGNOSIS — E782 Mixed hyperlipidemia: Secondary | ICD-10-CM

## 2024-03-09 DIAGNOSIS — E039 Hypothyroidism, unspecified: Secondary | ICD-10-CM

## 2024-03-09 NOTE — Progress Notes (Signed)
 Complex Care Management Note  Care Guide Note 03/09/2024 Name: Lucas Kline MRN: [id number] DOB: [birth year]  [name] is a 89 y.o. year old male who sees [place], [place], [place] for primary care. I reached out to Elsie Leotha Ishihara by phone today to offer complex care management services.  [name] was given information about Complex Care Management services today including:   The Complex Care Management services include support from the care team which includes your Nurse Care Manager, Clinical Social Worker, or Pharmacist.  The Complex Care Management team is here to help remove barriers to the health concerns and goals most important to you. Complex Care Management services are voluntary, and the patient may decline or stop services at any time by request to their care team member.   Complex Care Management Consent Status: Patient agreed to services and verbal consent obtained.   Follow up plan:  Telephone appointment with complex care management team member scheduled for:  03/12/24 & 03/23/24.  Encounter Outcome:  Patient Scheduled  Dreama [facility]  [facility], [facility] Health Care Management Assistant Direct Dial: [phone]  Fax: [phone]

## 2024-03-12 ENCOUNTER — Other Ambulatory Visit: Payer: Self-pay | Admitting: Licensed Clinical Social Worker

## 2024-03-12 NOTE — Patient Instructions (Signed)
 Visit Information  Thank you for taking time to visit with me today. Please don't hesitate to contact me if I can be of assistance to you before our next scheduled appointment.  Your next care management appointment is by telephone on 03/23/2024 with RNCM  No further social work involvement pt daughter advise to call should further social work needs evolve.   Please call the care guide team at [phone] if you need to cancel, schedule, or reschedule an appointment.   Please call 911 if you are experiencing a Mental Health or Behavioral Health Crisis or need someone to talk to.   Alfonso Rummer MSW, LCSW Licensed Clinical Social Worker  [facility], Population Health Direct Dial: [phone]  Fax: [phone]

## 2024-03-12 NOTE — Patient Outreach (Signed)
 Complex Care Management   Visit Note  03/12/2024  Name:  Lucas Kline MRN: [id number] DOB: Feb 21, 1929  Situation: Referral received for Complex Care Management related to caregiver support I obtained verbal consent from Caregiver.  Visit completed with Lucas Kline  on the phone Pt resides with son. Phone visit completed with daughter Ms. Walkins provided with careiver support and options for aging father such as pca services and adult day center.  Background:   Past Medical History:  Diagnosis Date   Adult BMI <19 kg/sq m 01/21/2020   Alzheimer disease (HCC)    Atherosclerotic heart disease of native coronary artery without angina pectoris    CHRONIC    Atrial fibrillation (HCC) 06/03/2022   B12 deficiency    CHRONIC   Bilateral carotid bruits 01/28/2020   Chronic atrial fibrillation (HCC) 01/21/2020   CKD (chronic kidney disease), stage II    MILD   CKD (chronic kidney disease), stage III (HCC)    MODERATE (CHRONIC)   Contusion of left knee 09/28/2021   Coronary atherosclerosis    CHRONIC   Essential hypertension 01/28/2020   Essential hypertension, benign    Essential tremor    CHRONIC   Malnutrition of moderate degree (HCC) 01/21/2020   Memory deficit 09/08/2020   Mixed hearing loss, bilateral    CHRONIC   Mixed hyperlipidemia    CHRONIC    Neck pain 07/01/2021   Obstructive chronic bronchitis without exacerbation (HCC) 01/21/2020   Osteoarthritis    OF KNEE CHRONIC   Personal history of malignant neoplasm of larynx 01/18/2020   Primary hypothyroidism 01/22/2020   Unstable gait 01/21/2020    Assessment: Patient Reported Symptoms:  Cognitive Cognitive Status: Struggling with memory recall      Neurological Neurological Review of Symptoms: No symptoms reported    HEENT HEENT Symptoms Reported: No symptoms reported      Cardiovascular Cardiovascular Symptoms Reported: No symptoms reported Does patient have uncontrolled Hypertension?: No    Respiratory  Respiratory Symptoms Reported: No symptoms reported    Endocrine Endocrine Symptoms Reported: No symptoms reported Is patient diabetic?: No    Gastrointestinal Gastrointestinal Symptoms Reported: No symptoms reported      Genitourinary Genitourinary Symptoms Reported: No symptoms reported    Integumentary Integumentary Symptoms Reported: No symptoms reported    Musculoskeletal Musculoskelatal Symptoms Reviewed: No symptoms reported   Falls in the past year?: Yes Number of falls in past year: 1 or less Was there an injury with Fall?: No Fall Risk Category Calculator: 1 Patient Fall Risk Level: Low Fall Risk    Psychosocial       Quality of Family Relationships: involved, supportive, helpful Do you feel physically threatened by others?: No      10/03/2023    8:39 AM  Depression screen PHQ 2/9  Decreased Interest 0  Down, Depressed, Hopeless 0  PHQ - 2 Score 0    There were no vitals filed for this visit.  Medications Reviewed Today     Reviewed by Clement Odor, LCSW (Social Worker) on 03/12/24 at 0940  Med List Status: <None>   Medication Order Taking? Sig Documenting Provider Last Dose Status Informant  apixaban  (ELIQUIS ) 2.5 MG TABS tablet [id number] No Take 1 tablet (2.5 mg total) by mouth 2 (two) times daily.  Patient not taking: Reported on 02/21/2024   Milon Cleaves, [place] Not Taking Active   bacitracin  ointment [id number] No Apply 1 Application topically 2 (two) times daily. Milon Cleaves, [place] Taking Active   levothyroxine  (SYNTHROID ) 50  MCG tablet [id number]  TAKE 1 TABLET BY MOUTH EVERY DAY Milon Cleaves, [place]  Active   memantine  (NAMENDA ) 10 MG tablet [id number] No TAKE 1 TABLET BY MOUTH TWICE A DAY Craft, Cleaves, [place] Taking Active   simvastatin  (ZOCOR ) 20 MG tablet [id number] No TAKE 1 TABLET BY MOUTH EVERY DAY Revankar, Jennifer SAUNDERS, MD Taking Active   verapamil  (CALAN -SR) 240 MG CR tablet [id number] No TAKE 1 TABLET BY MOUTH EVERY DAY Cox, Kirsten, MD Taking Active              Recommendation:   Continue Current Plan of Care  Follow Up Plan:   Telephone follow up appointment date/time:  03/23/2024  Alfonso Rummer MSW, LCSW Licensed Clinical Social Worker  [facility], Population Health Direct Dial: [phone]  Fax: [phone]

## 2024-03-13 ENCOUNTER — Other Ambulatory Visit: Payer: Self-pay

## 2024-03-13 ENCOUNTER — Ambulatory Visit: Attending: Physician Assistant | Admitting: Physical Therapy

## 2024-03-13 DIAGNOSIS — R531 Weakness: Secondary | ICD-10-CM | POA: Diagnosis present

## 2024-03-13 DIAGNOSIS — R269 Unspecified abnormalities of gait and mobility: Secondary | ICD-10-CM | POA: Diagnosis present

## 2024-03-13 DIAGNOSIS — R262 Difficulty in walking, not elsewhere classified: Secondary | ICD-10-CM | POA: Diagnosis present

## 2024-03-13 DIAGNOSIS — R2681 Unsteadiness on feet: Secondary | ICD-10-CM | POA: Diagnosis present

## 2024-03-13 NOTE — Therapy (Signed)
 OUTPATIENT PHYSICAL THERAPY NEURO EVALUATION   Patient Name: Lucas Kline MRN: [id number] DOB:[birth year], 89 y.o., male Today's Date: 03/13/2024   PCP: Milon Cleaves, PA  REFERRING PROVIDER: Milon Cleaves, PA   END OF SESSION:   PT End of Session - 03/13/24 1616     Visit Number 1    Number of Visits 24    Date for PT Re-Evaluation 06/05/24    PT Start Time 1617    PT Stop Time 1706    PT Time Calculation (min) 49 min    Equipment Utilized During Treatment Gait belt    Activity Tolerance Patient tolerated treatment well    Behavior During Therapy WFL for tasks assessed/performed          Past Medical History:  Diagnosis Date   Adult BMI <19 kg/sq m 01/21/2020   Alzheimer disease (HCC)    Atherosclerotic heart disease of native coronary artery without angina pectoris    CHRONIC    Atrial fibrillation (HCC) 06/03/2022   B12 deficiency    CHRONIC   Bilateral carotid bruits 01/28/2020   Chronic atrial fibrillation (HCC) 01/21/2020   CKD (chronic kidney disease), stage II    MILD   CKD (chronic kidney disease), stage III (HCC)    MODERATE (CHRONIC)   Contusion of left knee 09/28/2021   Coronary atherosclerosis    CHRONIC   Essential hypertension 01/28/2020   Essential hypertension, benign    Essential tremor    CHRONIC   Malnutrition of moderate degree (HCC) 01/21/2020   Memory deficit 09/08/2020   Mixed hearing loss, bilateral    CHRONIC   Mixed hyperlipidemia    CHRONIC    Neck pain 07/01/2021   Obstructive chronic bronchitis without exacerbation (HCC) 01/21/2020   Osteoarthritis    OF KNEE CHRONIC   Personal history of malignant neoplasm of larynx 01/18/2020   Primary hypothyroidism 01/22/2020   Unstable gait 01/21/2020   Past Surgical History:  Procedure Laterality Date   CATARACT EXTRACTION, BILATERAL     Patient Active Problem List   Diagnosis Date Noted   COPD exacerbation (HCC) 02/21/2024   Cat bite of hand, left, subsequent encounter  10/18/2023   Cellulitis of left hand 10/18/2023   Coronary atherosclerosis 07/21/2022   Adult BMI <19 kg/sq m 12/22/2021   Neck pain 07/01/2021   Memory deficit 09/08/2020   B12 deficiency    Essential tremor    Mixed hearing loss, bilateral    CKD (chronic kidney disease), stage III (HCC) 01/28/2020   Bilateral carotid bruits 01/28/2020   Essential hypertension 01/28/2020   Primary hypothyroidism 01/22/2020   Unstable gait 01/21/2020   Obstructive chronic bronchitis without exacerbation (HCC) 01/21/2020   Malnutrition of moderate degree (HCC) 01/21/2020   Chronic atrial fibrillation (HCC) 01/21/2020   Personal history of malignant neoplasm of larynx 01/18/2020   Osteoarthritis    Alzheimer disease (HCC)    Atherosclerotic heart disease of native coronary artery without angina pectoris    Mixed hyperlipidemia     ONSET DATE: A few months ago  REFERRING DIAG: R26.81 (ICD-10-CM) - Unstable gait   THERAPY DIAG:  Generalized weakness  Difficulty in walking  Unsteadiness on feet  Abnormality of gait  Rationale for Evaluation and Treatment: Rehabilitation  SUBJECTIVE:  SUBJECTIVE STATEMENT:  Patient arrives in transport chair with his daughter, [name]. Pt states he has ben having some trouble with his walking that started a few months ago. Patient also reports feeling a lot of weakness in his LEs. Pt's daughter assisted with subjective history. Pt's daughter states up to ~a month ago, patient was walking 2 miles a day. Pt's DTR reports patient has also had a lot of weight loss recently (now down to ~105lbs, was ~130lbs). Patient states he mostly stays at home every day, but has a son who lives with him and will take him out to eat. Patient has a walking stick he uses for support when  ambulating; however, DTR expresses concern that it is not sufficient support to keep patient safe. Pt's DTR also expresses concern with pt stepping in/out of tub shower and states it is a challenge to get patient to shower at least 2x/week (due to pt not liking showering).  Per chart review, pt went to ED on 6/27 due to weakness, but no significant findings with note stating Your EKG, blood work and CT scan were all reassuring. We did not find evidence of explanation for your symptoms or abnormality that would warrant hospitalization.   PMH significant for Alzheimer's, a-fib, B12 deficiency, HTN,, Stage 3a chronic kidney disease, COPD  Pt accompanied by: family member (daughter, [name])  PERTINENT HISTORY: Alzheimer's, a-fib, B12 deficiency, HTN,, Stage 3a chronic kidney disease, COPD  PAIN:  Are you having pain? No  PRECAUTIONS: Fall  RED FLAGS: None   WEIGHT BEARING RESTRICTIONS: No  FALLS: Has patient fallen in last 6 months? No  LIVING ENVIRONMENT: Lives with: lives with their son Lives in: House/apartment Stairs: Yes: External: 3-4 steps; on left going up and patient also has 1 step inside the house to step up/down into a utility room Has following equipment at home: Single point cane, Walker - 2 wheeled, and DTR says pt reports the cane is too heavy Patient has a wooden stick he uses to ambulate with, which DTR expresses concern this is not a safe support for patient.  Patient's son is self-employed so his schedule is flexible and patient stays home by himself most of the day.   PLOF: Independent, walking 81miles/day approximately 1-28months ago  PATIENT GOALS: get his legs stronger and be able to walk better  OBJECTIVE:  Note: Objective measures were completed at Evaluation unless otherwise noted.  DIAGNOSTIC FINDINGS: MRI of brain ordered on 02/21/2024  COGNITION: Overall cognitive status: History of cognitive impairments - at baseline and pt able to answer questions  accurately   SENSATION: Light touch: WFL  COORDINATION: Not formally assessed  EDEMA:  None noted  MUSCLE TONE: Not formally assessed  MUSCLE LENGTH: Not formally assessed  DTRs:  Not formally assessed  POSTURE: rounded shoulders, forward head, increased thoracic kyphosis, and posterior pelvic tilt  LOWER EXTREMITY ROM:     Active ROM Right Eval Left Eval  Hip flexion    Hip extension    Hip abduction    Hip adduction    Hip internal rotation    Hip external rotation    Knee flexion    Knee extension    Ankle dorsiflexion Decreased due to strength deficits, PROM WNL   Ankle plantarflexion    Ankle inversion    Ankle eversion     (Blank rows = not tested)  LOWER EXTREMITY MMT:    MMT Right Eval Left Eval  Hip flexion 3+ 4-  Hip extension  Hip abduction 4 in sitting 4 in sitting  Hip adduction 4 in sitting 4 in sitting  Hip internal rotation    Hip external rotation    Knee flexion 3+ 4-  Knee extension 4 4  Ankle dorsiflexion 3- 3  Ankle plantarflexion 3 3+  Ankle inversion    Ankle eversion    (Blank rows = not tested)  Manual Muscle Test Scale 0/5 = No muscle contraction can be seen or felt 1/5 = Contraction can be felt, but there is no motion 2-/5 = Part moves through incomplete ROM w/ gravity decreased 2/5 = Part moves through complete ROM w/ gravity decreased 2+/5 = Part moves through incomplete ROM (<50%) against gravity or through complete ROM w/ gravity 3-/5 = Part moves through incomplete ROM (>50%) against gravity 3/5 = Part moves through complete ROM against gravity 3+/5 = Part moves through complete ROM against gravity/slight resistance 4-/5= Holds test position against slight to moderate pressure 4/5 = Part moves through complete ROM against gravity/moderate resistance 4+/5= Holds test position against moderate to strong pressure 5/5 = Part moves through complete ROM against gravity/full resistance  BED MOBILITY:  Not  tested  TRANSFERS: Sit to stand: CGA and Min A  Assistive device utilized: armrests     Stand to sit: CGA and Min A  Assistive device utilized: Teacher, adult education to chair: CGA and Min A  Assistive device utilized: armrests and HHA to assess patient's balance       RAMP:  Not tested  CURB:  Not tested  STAIRS: Findings: Comments: need to assess GAIT: Findings: Gait Characteristics: step to pattern, step through pattern, decreased arm swing- Right, decreased arm swing- Left, decreased step length- Right, decreased step length- Left, decreased stride length, decreased hip/knee flexion- Right, decreased hip/knee flexion- Left, lateral lean- Right, lateral lean- Left, narrow BOS, poor foot clearance- Right, and poor foot clearance- Left, Distance walked: ~54ft, Assistive device utilized:no AD used to assess pt's balance and None, Level of assistance: Min A, and Comments:  narrow BOS, R/L postural instability with staggering steps to regain balance, short step lengths bilaterally  FUNCTIONAL TESTS:  5 times sit to stand: 33.54 seconds requiring use of UE support to push up into standing Timed up and go (TUG): need to assess 6 minute walk test: need to assess 10 meter walk test: 0.40 m/s, no AD, with CGA/min A  Berg Balance Scale: need to assess  PATIENT SURVEYS:  ABC scale:  The Activities-Specific Balance Confidence (ABC) Scale 0% 10 20 30  40 50 60 70 80 90 100% No confidence<->completely confident  "How confident are you that you will not lose your balance or become unsteady when you . .   *Pt's daughter, [name], assisted with answering this questionnaire*  Date tested 03/13/2024  Walk around the house 60%  2. Walk up or down stairs 70%  3. Bend over and pick up a slipper from in front of a closet floor 0%  4. Reach for a small can off a shelf at eye level 100%  5. Stand on tip toes and reach for something above your head 100%  6. Stand on a chair and reach for something 0%  7.  Sweep the floor 70%  8. Walk outside the house to a car parked in the driveway 70%  9. Get into or out of a car 70%  10. Walk across a parking lot to the mall 0%  11. Walk up or down a ramp  0%  12. Walk in a crowded mall where people rapidly walk past you 20%  13. Are bumped into by people as you walk through the mall 20%  14. Step onto or off of an escalator while you are holding onto the railing 0%  15. Step onto or off an escalator while holding onto parcels such that you cannot hold onto the railing 0%  16. Walk outside on icy sidewalks 0%  Total: #/16 36.25%                                                                                                                                 TREATMENT DATE: 03/13/2024  Therapist provides pt with heating pad to help keep him warm during session due to pt reporting feeling very cold in gym. No adverse events from use of heating pad and pt very appreciative of it for warmth.  Unless otherwise stated, CGA was provided and gait belt donned in order to ensure pt safety throughout session.  Attempted mobility tasks without AD today to assess patient's balance.  10 Meter Walk Test: Patient instructed to walk 10 meters (32.8 ft) as quickly and as safely as possible at their normal speed x2 and at a fast speed x2. Time measured from 2 meter mark to 8 meter mark to accommodate ramp-up and ramp-down.  Normal speed 1: 0.40 m/s (24.45 seconds) Normal speed 2: 0.41 m/s (24.24 seconds)  Average Normal speed: 0.40 m/s, no AD, with CGA/min A for balance due to lateral instability Cut off scores: <0.4 m/s = household Ambulator, 0.4-0.8 m/s = limited community Ambulator, >0.8 m/s = community Ambulator, >1.2 m/s = crossing a street, <1.0 = increased fall risk MCID 0.05 m/s (small), 0.13 m/s (moderate), 0.06 m/s (significant)  (ANPTA Core Set of Outcome Measures for Adults with Neurologic Conditions, 2018)   Five times Sit to Stand Test (FTSS) "Stand up and sit  down as quickly as possible 5 times, keeping your arms folded across your chest."    TIME: 33.54 seconds requires use of UE support to push up into standing from amrests  Times > 13.6 seconds is associated with increased disability and morbidity (Guralnik, 2000) Times > 15 seconds is predictive of recurrent falls in healthy individuals aged 71 and older (Buatois, et al., 2008) Normal performance values in community dwelling individuals aged 65 and older (Bohannon, 2006): 60-69 years: 11.4 seconds 70-79 years: 12.6 seconds 80-89 years: 14.8 seconds  MCID: >= 2.3 seconds for Vestibular Disorders (Meretta, 2006)  Educated pt on initiating HEP as described below. Pt performed 1 set of each of the below exercises. Therapist educated pt and DTR on importance to start developing the routine of daily exercise at a level that is achievable for patient and then progressing his HEP as appropriate.   DTR inquiring about pt's use of a wheelchair and discussed using it when patient needs to walk prolonged community distances and if/when there is  a time constraint for the community outing.    PATIENT EDUCATION: Education details: Therapy POC, results of assessments today, HEP, discussed home safety and specific concerns DTR and patient have Person educated: Patient and Child(ren) (Daughter) Education method: Explanation and Handouts Education comprehension: verbalized understanding and needs further education  HOME EXERCISE PROGRAM:  Access Code: [email or web address] URL: [email or web address] Date: 03/13/2024 Prepared by: Connell Kiss  Exercises - Sit to Stand with Armchair  - 1 x daily - 7 x weekly - 2 sets - 5 reps - Heel Raises with Counter Support  - 1 x daily - 7 x weekly - 2 sets - 5 reps  GOALS: Goals reviewed with patient? Yes  SHORT TERM GOALS: Target date: 04/24/2024  Patient will be independent with home exercise program to improve strength/mobility for better functional  independence with ADLs.  Baseline: initiated on 03/13/2024 Goal status: INITIAL   LONG TERM GOALS: Target date: 06/05/2024   Patient (> 65 years old) will complete five times sit to stand test (5XSTS) in < 15 seconds indicating an increased LE strength and improved balance. Baseline: 33.54 seconds needs to push up from armrests to perform without assistance Goal status: INITIAL  2.  Patient will increase Berg Balance score to > 51/56 to demonstrate improved balance and decreased fall risk during functional activities and ADLs.  Baseline: need to assess Goal status: INITIAL  3.  Patient will increase six minute walk test distance to >1065ft for progression to community level ambulation, demonstrating improved gait endurance.  Baseline: need to assess Goal status: INITIAL  4.  Patient will increase 10 meter walk test to >1.17m/s as to improve gait speed for better community ambulation and to reduce fall risk. Baseline:  0.40 m/s, no AD, with CGA/min A for balance  Goal status: INITIAL  5.  Patient will reduce timed up and go to <11 seconds to reduce fall risk and demonstrate improved transfer/gait ability. Baseline: need to assess Goal status: INITIAL  6.  Patient will increase ABC scale score >60% to demonstrate better functional mobility and better confidence with ADLs.   Baseline: 36.25% Goal status: INITIAL  ASSESSMENT:  CLINICAL IMPRESSION: Patient is a 89 y.o. male who was seen today for physical therapy evaluation and treatment for B LE weakness, impaired balance, and unsteady gait. Per daughter, patient was very active, ambulating 2 miles a day up until ~1-2 months ago. Patient with noticeable weakness in B LEs as seen on MMTs and 5xSTS functional testing. Patient demonstrates impaired balance during gait on , without AD support, requiring min A for balance due to increased postural sway and staggering steps R>L. Patient and DTR also report patient with severely impaired  balance confidence as noted on ABC scale. Mr. Mineo will benefit from further skilled PT to improve these deficits in order to increase QOL, decrease fall risk, regain functional independence, and ease/safety with ADLs.   OBJECTIVE IMPAIRMENTS: Abnormal gait, cardiopulmonary status limiting activity, decreased activity tolerance, decreased balance, decreased cognition, decreased endurance, decreased knowledge of use of DME, decreased mobility, difficulty walking, and decreased strength.   ACTIVITY LIMITATIONS: carrying, lifting, bending, standing, squatting, stairs, transfers, bathing, toileting, dressing, reach over head, hygiene/grooming, and locomotion level  PARTICIPATION LIMITATIONS: meal prep, cleaning, laundry, shopping, community activity, and yard work  PERSONAL FACTORS: Age, Social background, Time since onset of injury/illness/exacerbation, and 3+ comorbidities: Alzheimer's, a-fib, B12 deficiency, HTN,, Stage 3a chronic kidney disease, COPD are also affecting patient's functional outcome.   REHAB POTENTIAL: Good  CLINICAL DECISION MAKING:  Evolving/moderate complexity  EVALUATION COMPLEXITY: Moderate  PLAN:  PT FREQUENCY: 1-2x/week  PT DURATION: 12 weeks  PLANNED INTERVENTIONS: [id number]- PT Re-evaluation, 97750- Physical Performance Testing, 97110-Therapeutic exercises, 97530- Therapeutic activity, 97112- Neuromuscular re-education, 97535- Self Care, [id number]- Manual therapy, [phone]- Gait training, [phone]- Orthotic Initial, [phone]- Orthotic/Prosthetic subsequent, [phone]- Canalith repositioning, Patient/Family education, Balance training, Stair training, Taping, Joint mobilization, Vestibular training, Visual/preceptual remediation/compensation, Cognitive remediation, DME instructions, Cryotherapy, Moist heat, and Biofeedback  PLAN FOR NEXT SESSION:  Assess the following OMs and update LTGs: TUG 6 min walk test Berg Focus on B LE functional strength & dynamic balance Practice stepping  over for tub/shower entry  Train stair navigation due to 4STE home with only L HR Progress HEP when appropriate    Gabriela Irigoyen, PT, DPT, NCS, CSRS Physical Therapist -   [facility]  5:33 PM 03/13/24

## 2024-03-19 ENCOUNTER — Ambulatory Visit: Admitting: Physical Therapy

## 2024-03-19 DIAGNOSIS — R2681 Unsteadiness on feet: Secondary | ICD-10-CM

## 2024-03-19 DIAGNOSIS — R531 Weakness: Secondary | ICD-10-CM | POA: Diagnosis not present

## 2024-03-19 DIAGNOSIS — R262 Difficulty in walking, not elsewhere classified: Secondary | ICD-10-CM

## 2024-03-19 DIAGNOSIS — R269 Unspecified abnormalities of gait and mobility: Secondary | ICD-10-CM

## 2024-03-19 NOTE — Therapy (Signed)
 OUTPATIENT PHYSICAL THERAPY NEURO EVALUATION   Patient Name: Lucas Kline MRN: [id number] DOB:12-04-1928, 89 y.o., male Today's Date: 03/19/2024   PCP: Lucas Cleaves, PA  REFERRING PROVIDER: Milon Cleaves, PA   END OF SESSION:   PT End of Session - 03/19/24 1409     Visit Number 2    Number of Visits 24    Date for PT Re-Evaluation 06/05/24    PT Start Time 1405    PT Stop Time 1443    PT Time Calculation (min) 38 min    Equipment Utilized During Treatment Gait belt    Activity Tolerance Patient tolerated treatment well    Behavior During Therapy WFL for tasks assessed/performed          Past Medical History:  Diagnosis Date   Adult BMI <19 kg/sq m 01/21/2020   Alzheimer disease (HCC)    Atherosclerotic heart disease of native coronary artery without angina pectoris    CHRONIC    Atrial fibrillation (HCC) 06/03/2022   B12 deficiency    CHRONIC   Bilateral carotid bruits 01/28/2020   Chronic atrial fibrillation (HCC) 01/21/2020   CKD (chronic kidney disease), stage II    MILD   CKD (chronic kidney disease), stage III (HCC)    MODERATE (CHRONIC)   Contusion of left knee 09/28/2021   Coronary atherosclerosis    CHRONIC   Essential hypertension 01/28/2020   Essential hypertension, benign    Essential tremor    CHRONIC   Malnutrition of moderate degree (HCC) 01/21/2020   Memory deficit 09/08/2020   Mixed hearing loss, bilateral    CHRONIC   Mixed hyperlipidemia    CHRONIC    Neck pain 07/01/2021   Obstructive chronic bronchitis without exacerbation (HCC) 01/21/2020   Osteoarthritis    OF KNEE CHRONIC   Personal history of malignant neoplasm of larynx 01/18/2020   Primary hypothyroidism 01/22/2020   Unstable gait 01/21/2020   Past Surgical History:  Procedure Laterality Date   CATARACT EXTRACTION, BILATERAL     Patient Active Problem List   Diagnosis Date Noted   COPD exacerbation (HCC) 02/21/2024   Cat bite of hand, left, subsequent encounter  10/18/2023   Cellulitis of left hand 10/18/2023   Coronary atherosclerosis 07/21/2022   Adult BMI <19 kg/sq m 12/22/2021   Neck pain 07/01/2021   Memory deficit 09/08/2020   B12 deficiency    Essential tremor    Mixed hearing loss, bilateral    CKD (chronic kidney disease), stage III (HCC) 01/28/2020   Bilateral carotid bruits 01/28/2020   Essential hypertension 01/28/2020   Primary hypothyroidism 01/22/2020   Unstable gait 01/21/2020   Obstructive chronic bronchitis without exacerbation (HCC) 01/21/2020   Malnutrition of moderate degree (HCC) 01/21/2020   Chronic atrial fibrillation (HCC) 01/21/2020   Personal history of malignant neoplasm of larynx 01/18/2020   Osteoarthritis    Alzheimer disease (HCC)    Atherosclerotic heart disease of native coronary artery without angina pectoris    Mixed hyperlipidemia     ONSET DATE: A few months ago  REFERRING DIAG: R26.81 (ICD-10-CM) - Unstable gait   THERAPY DIAG:  No diagnosis found.  Rationale for Evaluation and Treatment: Rehabilitation  SUBJECTIVE:  SUBJECTIVE STATEMENT: 03/19/24 Pt reports one fall since last visit with no apparent injury. Pt does say on multiple occasions that  I would be doing better if I didn't fall yesterday but unable to explain why when prompted. No one was around when he fell to corroborate why he fell but he reports it had something to do with a lawn mower.   From Eval: Patient arrives in transport chair with his daughter, Lucas Kline. Pt states he has ben having some trouble with his walking that started a few months ago. Patient also reports feeling a lot of weakness in his LEs. Pt's daughter assisted with subjective history. Pt's daughter states up to ~a month ago, patient was walking 2 miles a day. Pt's DTR reports  patient has also had a lot of weight loss recently (now down to ~105lbs, was ~130lbs). Patient states he mostly stays at home every day, but has a son who lives with him and will take him out to eat. Patient has a walking stick he uses for support when ambulating; however, DTR expresses concern that it is not sufficient support to keep patient safe. Pt's DTR also expresses concern with pt stepping in/out of tub shower and states it is a challenge to get patient to shower at least 2x/week (due to pt not liking showering).  Per chart review, pt went to ED on 6/27 due to weakness, but no significant findings with note stating Your EKG, blood work and CT scan were all reassuring. We did not find evidence of explanation for your symptoms or abnormality that would warrant hospitalization.   PMH significant for Alzheimer's, a-fib, B12 deficiency, HTN,, Stage 3a chronic kidney disease, COPD  Pt accompanied by: family member (daughter, Lucas Kline)  PERTINENT HISTORY: Alzheimer's, a-fib, B12 deficiency, HTN,, Stage 3a chronic kidney disease, COPD  PAIN:  Are you having pain? No  PRECAUTIONS: Fall  RED FLAGS: None   WEIGHT BEARING RESTRICTIONS: No  FALLS: Has patient fallen in last 6 months? No  LIVING ENVIRONMENT: Lives with: lives with their son Lives in: House/apartment Stairs: Yes: External: 3-4 steps; on left going up and patient also has 1 step inside the house to step up/down into a utility room Has following equipment at home: Single point cane, Walker - 2 wheeled, and DTR says pt reports the cane is too heavy Patient has a wooden stick he uses to ambulate with, which DTR expresses concern this is not a safe support for patient.  Patient's son is self-employed so his schedule is flexible and patient stays home by himself most of the day.   PLOF: Independent, walking 85miles/day approximately 1-27months ago  PATIENT GOALS: get his legs stronger and be able to walk better  OBJECTIVE:   Note: Objective measures were completed at Evaluation unless otherwise noted.  DIAGNOSTIC FINDINGS: MRI of brain ordered on 02/21/2024  COGNITION: Overall cognitive status: History of cognitive impairments - at baseline and pt able to answer questions accurately   SENSATION: Light touch: WFL  COORDINATION: Not formally assessed  EDEMA:  None noted  MUSCLE TONE: Not formally assessed  MUSCLE LENGTH: Not formally assessed  DTRs:  Not formally assessed  POSTURE: rounded shoulders, forward head, increased thoracic kyphosis, and posterior pelvic tilt  LOWER EXTREMITY ROM:     Active ROM Right Eval Left Eval  Hip flexion    Hip extension    Hip abduction    Hip adduction    Hip internal rotation    Hip external rotation  Knee flexion    Knee extension    Ankle dorsiflexion Decreased due to strength deficits, PROM WNL   Ankle plantarflexion    Ankle inversion    Ankle eversion     (Blank rows = not tested)  LOWER EXTREMITY MMT:    MMT Right Eval Left Eval  Hip flexion 3+ 4-  Hip extension    Hip abduction 4 in sitting 4 in sitting  Hip adduction 4 in sitting 4 in sitting  Hip internal rotation    Hip external rotation    Knee flexion 3+ 4-  Knee extension 4 4  Ankle dorsiflexion 3- 3  Ankle plantarflexion 3 3+  Ankle inversion    Ankle eversion    (Blank rows = not tested)  Manual Muscle Test Scale 0/5 = No muscle contraction can be seen or felt 1/5 = Contraction can be felt, but there is no motion 2-/5 = Part moves through incomplete ROM w/ gravity decreased 2/5 = Part moves through complete ROM w/ gravity decreased 2+/5 = Part moves through incomplete ROM (<50%) against gravity or through complete ROM w/ gravity 3-/5 = Part moves through incomplete ROM (>50%) against gravity 3/5 = Part moves through complete ROM against gravity 3+/5 = Part moves through complete ROM against gravity/slight resistance 4-/5= Holds test position against slight to  moderate pressure 4/5 = Part moves through complete ROM against gravity/moderate resistance 4+/5= Holds test position against moderate to strong pressure 5/5 = Part moves through complete ROM against gravity/full resistance  BED MOBILITY:  Not tested  TRANSFERS: Sit to stand: CGA and Min A  Assistive device utilized: armrests     Stand to sit: CGA and Min A  Assistive device utilized: Teacher, adult education to chair: CGA and Min A  Assistive device utilized: armrests and HHA to assess patient's balance       RAMP:  Not tested  CURB:  Not tested  STAIRS: Findings: Comments: need to assess GAIT: Findings: Gait Characteristics: step to pattern, step through pattern, decreased arm swing- Right, decreased arm swing- Left, decreased step length- Right, decreased step length- Left, decreased stride length, decreased hip/knee flexion- Right, decreased hip/knee flexion- Left, lateral lean- Right, lateral lean- Left, narrow BOS, poor foot clearance- Right, and poor foot clearance- Left, Distance walked: ~66ft, Assistive device utilized:no AD used to assess pt's balance and None, Level of assistance: Min A, and Comments:  narrow BOS, R/L postural instability with staggering steps to regain balance, short step lengths bilaterally  FUNCTIONAL TESTS:  5 times sit to stand: 33.54 seconds requiring use of UE support to push up into standing Timed up and go (TUG): need to assess 6 minute walk test: need to assess 10 meter walk test: 0.40 m/s, no AD, with CGA/min A  Berg Balance Scale: need to assess  PATIENT SURVEYS:  ABC scale:  The Activities-Specific Balance Confidence (ABC) Scale 0% 10 20 30  40 50 60 70 80 90 100% No confidence<->completely confident  "How confident are you that you will not lose your balance or become unsteady when you . .   *Pt's daughter, Lucas Kline, assisted with answering this questionnaire*  Date tested 03/13/2024  Walk around the house 60%  2. Walk up or down stairs 70%   3. Bend over and pick up a slipper from in front of a closet floor 0%  4. Reach for a small can off a shelf at eye level 100%  5. Stand on tip toes and reach for  something above your head 100%  6. Stand on a chair and reach for something 0%  7. Sweep the floor 70%  8. Walk outside the house to a car parked in the driveway 70%  9. Get into or out of a car 70%  10. Walk across a parking lot to the mall 0%  11. Walk up or down a ramp 0%  12. Walk in a crowded mall where people rapidly walk past you 20%  13. Are bumped into by people as you walk through the mall 20%  14. Step onto or off of an escalator while you are holding onto the railing 0%  15. Step onto or off an escalator while holding onto parcels such that you cannot hold onto the railing 0%  16. Walk outside on icy sidewalks 0%  Total: #/16 36.25%                                                                                                                                 TREATMENT DATE: 03/19/2024  PT instructed pt in TUG: 47.5  sec with walker  31.5 sec with SPC ( >13.5 sec indicates increased fall risk)  6 Min Walk Test:  Instructed patient to ambulate as quickly and as safely as possible for 6 minutes using LRAD. Patient was allowed to take standing rest breaks without stopping the test, but if the patient required a sitting rest break the clock would be stopped and the test would be over.  Results: 315 feet using a SPC with minA on multiple accounts due to either stumble over R LE or stumble over cane. Results indicate that the patient has reduced endurance with ambulation compared to age matched norms.  Age Matched Norms (in meters): 73-69 yo M: 11 F: 20, 42-79 yo M: 22 F: 471, 66-89 yo M: 417 F: 392 MDC: 58.21 meters (190.98 feet) or 50 meters (ANPTA Core Set of Outcome Measures for Adults with Neurologic Conditions, 2018)  TA- To improve functional movements patterns for everyday tasks  STS 2 x 5 reps with B UE assist,   STS from seated position on airex pad with no UE assist 2 x 5 reps cues for forward trunk lean   PATIENT EDUCATION: Education details: Therapy POC, results of assessments today, HEP, discussed home safety and specific concerns DTR and patient have Person educated: Patient and Child(ren) (Daughter) Education method: Explanation and Handouts Education comprehension: verbalized understanding and needs further education  HOME EXERCISE PROGRAM:  Access Code: [email or web address] URL: [email or web address] Date: 03/13/2024 Prepared by: Connell Kiss  Exercises - Sit to Stand with Armchair  - 1 x daily - 7 x weekly - 2 sets - 5 reps - Heel Raises with Counter Support  - 1 x daily - 7 x weekly - 2 sets - 5 reps  GOALS: Goals reviewed with patient? Yes  SHORT TERM GOALS: Target date: 04/24/2024  Patient will be independent with home exercise  program to improve strength/mobility for better functional independence with ADLs.  Baseline: initiated on 03/13/2024 Goal status: INITIAL   LONG TERM GOALS: Target date: 06/05/2024   Patient (> 38 years old) will complete five times sit to stand test (5XSTS) in < 15 seconds indicating an increased LE strength and improved balance. Baseline: 33.54 seconds needs to push up from armrests to perform without assistance Goal status: INITIAL  2.  Patient will increase Berg Balance score to > 51/56 to demonstrate improved balance and decreased fall risk during functional activities and ADLs.  Baseline: need to assess Goal status: INITIAL  3.  Patient will increase six minute walk test distance to >1036ft for progression to community level ambulation, demonstrating improved gait endurance.  Baseline: 7/7: 315 ft with SPC with multiple LOB requiring min A from PT to prevent fall Goal status: INITIAL  4.  Patient will increase 10 meter walk test to >1.59m/s as to improve gait speed for better community ambulation and to reduce fall risk. Baseline:  0.40  m/s, no AD, with CGA/min A for balance  Goal status: INITIAL  5.  Patient will reduce timed up and go to <11 seconds to reduce fall risk and demonstrate improved transfer/gait ability. Baseline: 7/7:  Goal status: INITIAL  6.  Patient will increase ABC scale score >60% to demonstrate better functional mobility and better confidence with ADLs.   Baseline: 36.25% Goal status: INITIAL  ASSESSMENT:  CLINICAL IMPRESSION:  Patient is a 89 y.o. male who was seen today for physical therapy treatment for B LE weakness, impaired balance, and unsteady gait. Pt demonstrated significant unsteadiness when completing further functional testing this date and with initial transfers to and from transport chair. Pt has difficulty with instruction, difficult to tell if this was from hearing loss or with cognitive processing deficits.  Mr. Stelly will benefit from further skilled PT to improve these deficits in order to increase QOL, decrease fall risk, regain functional independence, and ease/safety with ADLs.   OBJECTIVE IMPAIRMENTS: Abnormal gait, cardiopulmonary status limiting activity, decreased activity tolerance, decreased balance, decreased cognition, decreased endurance, decreased knowledge of use of DME, decreased mobility, difficulty walking, and decreased strength.   ACTIVITY LIMITATIONS: carrying, lifting, bending, standing, squatting, stairs, transfers, bathing, toileting, dressing, reach over head, hygiene/grooming, and locomotion level  PARTICIPATION LIMITATIONS: meal prep, cleaning, laundry, shopping, community activity, and yard work  PERSONAL FACTORS: Age, Social background, Time since onset of injury/illness/exacerbation, and 3+ comorbidities: Alzheimer's, a-fib, B12 deficiency, HTN,, Stage 3a chronic kidney disease, COPD are also affecting patient's functional outcome.   REHAB POTENTIAL: Good  CLINICAL DECISION MAKING: Evolving/moderate complexity  EVALUATION COMPLEXITY:  Moderate  PLAN:  PT FREQUENCY: 1-2x/week  PT DURATION: 12 weeks  PLANNED INTERVENTIONS: [id number]- PT Re-evaluation, 97750- Physical Performance Testing, 97110-Therapeutic exercises, 97530- Therapeutic activity, 97112- Neuromuscular re-education, 97535- Self Care, [id number]- Manual therapy, [phone]- Gait training, [phone]- Orthotic Initial, [phone]- Orthotic/Prosthetic subsequent, [phone]- Canalith repositioning, Patient/Family education, Balance training, Stair training, Taping, Joint mobilization, Vestibular training, Visual/preceptual remediation/compensation, Cognitive remediation, DME instructions, Cryotherapy, Moist heat, and Biofeedback  PLAN FOR NEXT SESSION:  Assess the following OMs and update LTGs: Berg Focus on B LE functional strength & dynamic balance Practice stepping over for tub/shower entry  Train stair navigation due to 4STE home with only L HR Progress HEP when appropriate    Note: Portions of this document were prepared using Conservation officer, historic buildings and although reviewed may contain unintentional dictation errors in syntax, grammar, or spelling.  Lonni KATHEE Gainer PT ,  DPT Physical Therapist- [facility]  [facility]  2:10 PM 03/19/24

## 2024-03-20 ENCOUNTER — Other Ambulatory Visit: Payer: Self-pay | Admitting: Physician Assistant

## 2024-03-20 MED ORDER — VERAPAMIL HCL ER 240 MG PO TBCR: 240.0000 mg | EXTENDED_RELEASE_TABLET | Freq: Every day | ORAL | 1 refills | Status: DC

## 2024-03-20 NOTE — Telephone Encounter (Unsigned)
 Copied from CRM [id number]. Topic: Clinical - Medication Refill >> Mar 20, 2024  9:03 AM Donna BRAVO wrote: Patient daughter [name] who is on [facility] requesting medication refill. Patient has not been taking this medication,   Medication: verapamil  (CALAN -SR) 240 MG CR tablet  Has the patient contacted their pharmacy? Yes  Pharmacy stating there is no refill   This is the patient's preferred pharmacy:   [id number] - [place], [place] - [address] AT [facility] [address] [place] [address] Phone: [phone] Fax: [phone]  Is this the correct pharmacy for this prescription? Yes If no, delete pharmacy and type the correct one.   Has the prescription been filled recently? Yes  Is the patient out of the medication? Yes  Has the patient been seen for an appointment in the last year OR does the patient have an upcoming appointment? Yes  Can we respond through MyChart? Yes  Agent: Please be advised that Rx refills may take up to 3 business days. We ask that you follow-up with your pharmacy.  Please call [name] with any questions [phone]

## 2024-03-21 ENCOUNTER — Ambulatory Visit: Admitting: Physical Therapy

## 2024-03-21 DIAGNOSIS — R531 Weakness: Secondary | ICD-10-CM

## 2024-03-21 DIAGNOSIS — R269 Unspecified abnormalities of gait and mobility: Secondary | ICD-10-CM

## 2024-03-21 DIAGNOSIS — R262 Difficulty in walking, not elsewhere classified: Secondary | ICD-10-CM

## 2024-03-21 DIAGNOSIS — R2681 Unsteadiness on feet: Secondary | ICD-10-CM

## 2024-03-21 NOTE — Therapy (Signed)
 OUTPATIENT PHYSICAL THERAPY NEURO TREATMENT   Patient Name: Rasul Decola MRN: [id number] DOB:[birth year], 89 y.o., male Today's Date: 03/21/2024   PCP: Milon Cleaves, PA  REFERRING PROVIDER: Milon Cleaves, PA   END OF SESSION:   PT End of Session - 03/21/24 1412     Visit Number 3    Number of Visits 24    Date for PT Re-Evaluation 06/05/24    PT Start Time 1410    PT Stop Time 1449    PT Time Calculation (min) 39 min    Equipment Utilized During Treatment Gait belt    Activity Tolerance Patient tolerated treatment well    Behavior During Therapy WFL for tasks assessed/performed           Past Medical History:  Diagnosis Date   Adult BMI <19 kg/sq m 01/21/2020   Alzheimer disease (HCC)    Atherosclerotic heart disease of native coronary artery without angina pectoris    CHRONIC    Atrial fibrillation (HCC) 06/03/2022   B12 deficiency    CHRONIC   Bilateral carotid bruits 01/28/2020   Chronic atrial fibrillation (HCC) 01/21/2020   CKD (chronic kidney disease), stage II    MILD   CKD (chronic kidney disease), stage III (HCC)    MODERATE (CHRONIC)   Contusion of left knee 09/28/2021   Coronary atherosclerosis    CHRONIC   Essential hypertension 01/28/2020   Essential hypertension, benign    Essential tremor    CHRONIC   Malnutrition of moderate degree (HCC) 01/21/2020   Memory deficit 09/08/2020   Mixed hearing loss, bilateral    CHRONIC   Mixed hyperlipidemia    CHRONIC    Neck pain 07/01/2021   Obstructive chronic bronchitis without exacerbation (HCC) 01/21/2020   Osteoarthritis    OF KNEE CHRONIC   Personal history of malignant neoplasm of larynx 01/18/2020   Primary hypothyroidism 01/22/2020   Unstable gait 01/21/2020   Past Surgical History:  Procedure Laterality Date   CATARACT EXTRACTION, BILATERAL     Patient Active Problem List   Diagnosis Date Noted   COPD exacerbation (HCC) 02/21/2024   Cat bite of hand, left, subsequent encounter  10/18/2023   Cellulitis of left hand 10/18/2023   Coronary atherosclerosis 07/21/2022   Adult BMI <19 kg/sq m 12/22/2021   Neck pain 07/01/2021   Memory deficit 09/08/2020   B12 deficiency    Essential tremor    Mixed hearing loss, bilateral    CKD (chronic kidney disease), stage III (HCC) 01/28/2020   Bilateral carotid bruits 01/28/2020   Essential hypertension 01/28/2020   Primary hypothyroidism 01/22/2020   Unstable gait 01/21/2020   Obstructive chronic bronchitis without exacerbation (HCC) 01/21/2020   Malnutrition of moderate degree (HCC) 01/21/2020   Chronic atrial fibrillation (HCC) 01/21/2020   Personal history of malignant neoplasm of larynx 01/18/2020   Osteoarthritis    Alzheimer disease (HCC)    Atherosclerotic heart disease of native coronary artery without angina pectoris    Mixed hyperlipidemia     ONSET DATE: A few months ago  REFERRING DIAG: R26.81 (ICD-10-CM) - Unstable gait   THERAPY DIAG:  Generalized weakness  Difficulty in walking  Unsteadiness on feet  Abnormality of gait  Rationale for Evaluation and Treatment: Rehabilitation  SUBJECTIVE:  SUBJECTIVE STATEMENT: 03/21/24  [name], DTR, and patient apologize for late arrival to therapy. [name], DTR, reports patient is very weak today, but she would still like to see if patient is able to participate in therapy and whether he will perform better for therapist.  [name] reports her DTR is parking the car and will bring in 3 canes for patient to try during therapy.   DTR reports pt had some type of fall the night before last, but unsure of the circumstances; however, pt reports he fell into the TV.  DTR reports pt required UE support around her neck to safely ambulate to the car in order to come to therapy  session today.  DTR reports pt's appetite fluctuates some.  Pt denies pain to start session.   DTR reports they are going to pick-up Eliquis  and verapamil  (calcium channel blocker) medications today and believes the lack of these medications may be contributing to patient's presentation (fatigue and weakness).  [name] reports her daughter, Morna, will be attending pt's next therapy appointment with him.     From Eval: Patient arrives in transport chair with his daughter, [name]. Pt states he has ben having some trouble with his walking that started a few months ago. Patient also reports feeling a lot of weakness in his LEs. Pt's daughter assisted with subjective history. Pt's daughter states up to ~a month ago, patient was walking 2 miles a day. Pt's DTR reports patient has also had a lot of weight loss recently (now down to ~105lbs, was ~130lbs). Patient states he mostly stays at home every day, but has a son who lives with him and will take him out to eat. Patient has a walking stick he uses for support when ambulating; however, DTR expresses concern that it is not sufficient support to keep patient safe. Pt's DTR also expresses concern with pt stepping in/out of tub shower and states it is a challenge to get patient to shower at least 2x/week (due to pt not liking showering).  Per chart review, pt went to ED on 6/27 due to weakness, but no significant findings with note stating Your EKG, blood work and CT scan were all reassuring. We did not find evidence of explanation for your symptoms or abnormality that would warrant hospitalization.   PMH significant for Alzheimer's, a-fib, B12 deficiency, HTN,, Stage 3a chronic kidney disease, COPD  Pt accompanied by: family member (daughter, [name])  PERTINENT HISTORY: Alzheimer's, a-fib, B12 deficiency, HTN,, Stage 3a chronic kidney disease, COPD  PAIN:  Are you having pain? No  PRECAUTIONS: Fall  RED FLAGS: None   WEIGHT BEARING  RESTRICTIONS: No  FALLS: Has patient fallen in last 6 months? No  LIVING ENVIRONMENT: Lives with: lives with their son Lives in: House/apartment Stairs: Yes: External: 3-4 steps; on left going up and patient also has 1 step inside the house to step up/down into a utility room Has following equipment at home: Single point cane, Walker - 2 wheeled, and DTR says pt reports the cane is too heavy Patient has a wooden stick he uses to ambulate with, which DTR expresses concern this is not a safe support for patient.  Patient's son is self-employed so his schedule is flexible and patient stays home by himself most of the day.   PLOF: Independent, walking 35miles/day approximately 1-36months ago  PATIENT GOALS: get his legs stronger and be able to walk better  OBJECTIVE:  Note: Objective measures were completed at Evaluation unless otherwise noted.  DIAGNOSTIC FINDINGS: MRI  of brain ordered on 02/21/2024  COGNITION: Overall cognitive status: History of cognitive impairments - at baseline and pt able to answer questions accurately   SENSATION: Light touch: WFL  COORDINATION: Not formally assessed  EDEMA:  None noted  MUSCLE TONE: Not formally assessed  MUSCLE LENGTH: Not formally assessed  DTRs:  Not formally assessed  POSTURE: rounded shoulders, forward head, increased thoracic kyphosis, and posterior pelvic tilt  LOWER EXTREMITY ROM:     Active ROM Right Eval Left Eval  Hip flexion    Hip extension    Hip abduction    Hip adduction    Hip internal rotation    Hip external rotation    Knee flexion    Knee extension    Ankle dorsiflexion Decreased due to strength deficits, PROM WNL   Ankle plantarflexion    Ankle inversion    Ankle eversion     (Blank rows = not tested)  LOWER EXTREMITY MMT:    MMT Right Eval Left Eval  Hip flexion 3+ 4-  Hip extension    Hip abduction 4 in sitting 4 in sitting  Hip adduction 4 in sitting 4 in sitting  Hip internal rotation     Hip external rotation    Knee flexion 3+ 4-  Knee extension 4 4  Ankle dorsiflexion 3- 3  Ankle plantarflexion 3 3+  Ankle inversion    Ankle eversion    (Blank rows = not tested)  Manual Muscle Test Scale 0/5 = No muscle contraction can be seen or felt 1/5 = Contraction can be felt, but there is no motion 2-/5 = Part moves through incomplete ROM w/ gravity decreased 2/5 = Part moves through complete ROM w/ gravity decreased 2+/5 = Part moves through incomplete ROM (<50%) against gravity or through complete ROM w/ gravity 3-/5 = Part moves through incomplete ROM (>50%) against gravity 3/5 = Part moves through complete ROM against gravity 3+/5 = Part moves through complete ROM against gravity/slight resistance 4-/5= Holds test position against slight to moderate pressure 4/5 = Part moves through complete ROM against gravity/moderate resistance 4+/5= Holds test position against moderate to strong pressure 5/5 = Part moves through complete ROM against gravity/full resistance  BED MOBILITY:  Not tested  TRANSFERS: Sit to stand: CGA and Min A  Assistive device utilized: armrests     Stand to sit: CGA and Min A  Assistive device utilized: Teacher, adult education to chair: CGA and Min A  Assistive device utilized: armrests and HHA to assess patient's balance       RAMP:  Not tested  CURB:  Not tested  STAIRS: Findings: Comments: need to assess GAIT: Findings: Gait Characteristics: step to pattern, step through pattern, decreased arm swing- Right, decreased arm swing- Left, decreased step length- Right, decreased step length- Left, decreased stride length, decreased hip/knee flexion- Right, decreased hip/knee flexion- Left, lateral lean- Right, lateral lean- Left, narrow BOS, poor foot clearance- Right, and poor foot clearance- Left, Distance walked: ~48ft, Assistive device utilized:no AD used to assess pt's balance and None, Level of assistance: Min A, and Comments:  narrow BOS, R/L  postural instability with staggering steps to regain balance, short step lengths bilaterally  FUNCTIONAL TESTS:  5 times sit to stand: 33.54 seconds requiring use of UE support to push up into standing Timed up and go (TUG): need to assess 6 minute walk test: need to assess 10 meter walk test: 0.40 m/s, no AD, with CGA/min A  Berg Balance  Scale: need to assess  PATIENT SURVEYS:  ABC scale:  The Activities-Specific Balance Confidence (ABC) Scale 0% 10 20 30  40 50 60 70 80 90 100% No confidence<->completely confident  "How confident are you that you will not lose your balance or become unsteady when you . .   *Pt's daughter, [name], assisted with answering this questionnaire*  Date tested 03/13/2024  Walk around the house 60%  2. Walk up or down stairs 70%  3. Bend over and pick up a slipper from in front of a closet floor 0%  4. Reach for a small can off a shelf at eye level 100%  5. Stand on tip toes and reach for something above your head 100%  6. Stand on a chair and reach for something 0%  7. Sweep the floor 70%  8. Walk outside the house to a car parked in the driveway 70%  9. Get into or out of a car 70%  10. Walk across a parking lot to the mall 0%  11. Walk up or down a ramp 0%  12. Walk in a crowded mall where people rapidly walk past you 20%  13. Are bumped into by people as you walk through the mall 20%  14. Step onto or off of an escalator while you are holding onto the railing 0%  15. Step onto or off an escalator while holding onto parcels such that you cannot hold onto the railing 0%  16. Walk outside on icy sidewalks 0%  Total: #/16 36.25%                                                                                                                                 TREATMENT DATE: 03/21/2024  Pt arrived to session in transport chair.   R stand pivot transfer, transport chair>green chair, with L HHA and light min A for steadying/safety and pt using R UE support  on chair armrests.   Gait training 36ft using hurricane (pt's personal) with min assist for balance. Pt demonstrating the following gait deviations with therapist providing the described cuing and facilitation for improvement:  Continues to have very narrow BOS Significantly short, shuffled steps  Increased balance instability Severely decreased gait speed Pt with difficulty managing the AD, letting it get too close to his BOS and 1x causing himself to trip with it Patient reports this cane as being too heavy for him to manage (this is a standard cane weight)  Introduced patient to rollator as alternative AD option to increase his safety with functional mobility. Educated on how to lock brakes during sit<>stands.  Educated on pushing up from chair armrests when going to stand rather than pulling up on rollator.   Educated on using rollator as a seat via pushing it up against wall and locking brakes Pt got disoriented on how to use AD when coming to stand after sitting on rollator requiring max/total cuing to reorient himself (  tried to hold onto the rollator handles behind him) *pt will benefit from reinforcement of all of this education  Gait training ~19ft using rollator with CGA/light min assist for steadying/safety. Pt demonstrating the following gait deviations with therapist providing the described cuing and facilitation for improvement:  Increased gait speed Longer step lengths Able to engage with environment by looking up at surroundings However, needs further training and reinforced education on how to manage AD in order to turn safely and turn to backup and sit in chair  Patient reports being open to the recommendation of using a rollator for his functional mobility. DTR reports she feels pt would likely be resistant to suggestion of using a standard RW; therefore, did not attempt this. Therapist recommended DTR considering going ahead and looking into how to purchase a rollator  and will continue to train patient on proper use during therapy.   PATIENT EDUCATION: Education details: Therapy POC, results of assessments today, HEP, discussed home safety and specific concerns DTR and patient have Person educated: Patient and Child(ren) (Daughter) Education method: Explanation and Handouts Education comprehension: verbalized understanding and needs further education  HOME EXERCISE PROGRAM:  Access Code: [email or web address] URL: [email or web address] Date: 03/13/2024 Prepared by: Connell Kiss  Exercises - Sit to Stand with Armchair  - 1 x daily - 7 x weekly - 2 sets - 5 reps - Heel Raises with Counter Support  - 1 x daily - 7 x weekly - 2 sets - 5 reps  GOALS: Goals reviewed with patient? Yes  SHORT TERM GOALS: Target date: 04/24/2024  Patient will be independent with home exercise program to improve strength/mobility for better functional independence with ADLs.  Baseline: initiated on 03/13/2024 Goal status: INITIAL   LONG TERM GOALS: Target date: 06/05/2024   Patient (> 86 years old) will complete five times sit to stand test (5XSTS) in < 15 seconds indicating an increased LE strength and improved balance. Baseline: 33.54 seconds needs to push up from armrests to perform without assistance Goal status: INITIAL  2.  Patient will increase Berg Balance score to > 51/56 to demonstrate improved balance and decreased fall risk during functional activities and ADLs.  Baseline: need to assess Goal status: INITIAL  3.  Patient will increase six minute walk test distance to >1054ft for progression to community level ambulation, demonstrating improved gait endurance.  Baseline: 7/7: 315 ft with SPC with multiple LOB requiring min A from PT to prevent fall Goal status: INITIAL  4.  Patient will increase 10 meter walk test to >1.20m/s as to improve gait speed for better community ambulation and to reduce fall risk. Baseline:  0.40 m/s, no AD, with CGA/min A for  balance  Goal status: INITIAL  5.  Patient will reduce timed up and go to <11 seconds to reduce fall risk and demonstrate improved transfer/gait ability. Baseline: 7/7:  Goal status: INITIAL  6.  Patient will increase ABC scale score >60% to demonstrate better functional mobility and better confidence with ADLs.   Baseline: 36.25% Goal status: INITIAL  ASSESSMENT:  CLINICAL IMPRESSION:  Patient is a 89 y.o. male who was seen today for physical therapy treatment for B LE weakness, impaired balance, and unsteady gait. Patient with late arrival to session and his DTR reporting pt is very weak today with him requiring increased assistance to ambulate to the car to attend therapy. DTR brought in pt's canes to trial during therapy; however, pt unsafe ambulating with them and pt having difficulty managing the weight of  a standard hurricane. Therapist introduced patient to use of a rollator and pt appears open to this during therapy session, enjoying practicing and learning how to use it while also demonstrating improved gait mechanics and balance using rollator vs cane. However, pt will require extensive reinforcement of safe use of AD with family supporting carryover of this training at home. Mr. Lovelady will benefit from further skilled PT to improve these deficits in order to increase QOL, decrease fall risk, regain functional independence, and ease/safety with ADLs.   OBJECTIVE IMPAIRMENTS: Abnormal gait, cardiopulmonary status limiting activity, decreased activity tolerance, decreased balance, decreased cognition, decreased endurance, decreased knowledge of use of DME, decreased mobility, difficulty walking, and decreased strength.   ACTIVITY LIMITATIONS: carrying, lifting, bending, standing, squatting, stairs, transfers, bathing, toileting, dressing, reach over head, hygiene/grooming, and locomotion level  PARTICIPATION LIMITATIONS: meal prep, cleaning, laundry, shopping, community activity, and  yard work  PERSONAL FACTORS: Age, Social background, Time since onset of injury/illness/exacerbation, and 3+ comorbidities: Alzheimer's, a-fib, B12 deficiency, HTN,, Stage 3a chronic kidney disease, COPD are also affecting patient's functional outcome.   REHAB POTENTIAL: Good  CLINICAL DECISION MAKING: Evolving/moderate complexity  EVALUATION COMPLEXITY: Moderate  PLAN:  PT FREQUENCY: 1-2x/week  PT DURATION: 12 weeks  PLANNED INTERVENTIONS: 97164- PT Re-evaluation, 97750- Physical Performance Testing, 97110-Therapeutic exercises, 97530- Therapeutic activity, 97112- Neuromuscular re-education, 97535- Self Care, [id number]- Manual therapy, [phone]- Gait training, [phone]- Orthotic Initial, [phone]- Orthotic/Prosthetic subsequent, [phone]- Canalith repositioning, Patient/Family education, Balance training, Stair training, Taping, Joint mobilization, Vestibular training, Visual/preceptual remediation/compensation, Cognitive remediation, DME instructions, Cryotherapy, Moist heat, and Biofeedback  PLAN FOR NEXT SESSION:  Assess the following OMs and update LTGs: Berg Continue training on safe rollator use and gait training with it Focus on B LE functional strength & dynamic balance Practice stepping over for tub/shower entry  Train stair navigation due to 4STE home with only L HR Progress HEP when appropriate    Seleena Reimers, PT, DPT, NCS, CSRS Physical Therapist - [place]  [facility]  2:54 PM 03/21/24

## 2024-03-21 NOTE — Telephone Encounter (Signed)
 Patient did not retunr application for return phone calls

## 2024-03-23 ENCOUNTER — Telehealth: Payer: Self-pay

## 2024-03-26 ENCOUNTER — Ambulatory Visit: Admitting: Physical Therapy

## 2024-03-28 ENCOUNTER — Ambulatory Visit

## 2024-03-30 ENCOUNTER — Telehealth: Payer: Self-pay

## 2024-03-30 NOTE — Telephone Encounter (Signed)
 Called and LVM that we got his signed order back and he is good to come for therapy Monday the 21st at 8:45. Asked them to call us  back if this day/time didnt work.

## 2024-04-02 ENCOUNTER — Ambulatory Visit

## 2024-04-02 ENCOUNTER — Ambulatory Visit: Payer: Medicare Other | Admitting: Physician Assistant

## 2024-04-04 ENCOUNTER — Ambulatory Visit: Admitting: Physical Therapy

## 2024-04-04 ENCOUNTER — Telehealth: Payer: Self-pay

## 2024-04-04 NOTE — Telephone Encounter (Signed)
 Called pt to let them know they missed their appt today. Informed them their next appt is next Tuesday (7/29) at 1:15pm. Provided a callback number and hours of operation.  Casilda Human, SPT

## 2024-04-10 ENCOUNTER — Ambulatory Visit: Admitting: Physical Therapy

## 2024-04-11 ENCOUNTER — Inpatient Hospital Stay: Admitting: Physician Assistant

## 2024-04-12 ENCOUNTER — Ambulatory Visit: Admitting: Physical Therapy

## 2024-04-16 ENCOUNTER — Ambulatory Visit: Admitting: Physical Therapy

## 2024-04-18 ENCOUNTER — Ambulatory Visit (INDEPENDENT_AMBULATORY_CARE_PROVIDER_SITE_OTHER): Admitting: Physician Assistant

## 2024-04-18 ENCOUNTER — Encounter: Payer: Self-pay | Admitting: Physician Assistant

## 2024-04-18 VITALS — BP 108/68 | HR 91 | Temp 97.1°F | Ht 66.0 in | Wt 93.0 lb

## 2024-04-18 DIAGNOSIS — L600 Ingrowing nail: Secondary | ICD-10-CM | POA: Diagnosis not present

## 2024-04-18 DIAGNOSIS — G250 Essential tremor: Secondary | ICD-10-CM

## 2024-04-18 DIAGNOSIS — J441 Chronic obstructive pulmonary disease with (acute) exacerbation: Secondary | ICD-10-CM | POA: Diagnosis not present

## 2024-04-18 DIAGNOSIS — G20A2 Parkinson's disease without dyskinesia, with fluctuations: Secondary | ICD-10-CM

## 2024-04-18 DIAGNOSIS — R634 Abnormal weight loss: Secondary | ICD-10-CM

## 2024-04-18 DIAGNOSIS — R2681 Unsteadiness on feet: Secondary | ICD-10-CM

## 2024-04-18 NOTE — Progress Notes (Signed)
 Subjective:  Patient ID: Lucas Kline, male    DOB: Aug 05, 1929  Age: 89 y.o. MRN: [id number]  Chief Complaint  Patient presents with   Hospital follow up    HPI:  Discussed the use of AI scribe software for clinical note transcription with the patient, who gave verbal consent to proceed.  History of Present Illness   [name] [name] is a 89 year old male who presents for a hospital follow-up after recent hospitalization for suspected dehydration and subsequent diagnosis of Parkinson's disease.  He was recently hospitalized due to concerns of dehydration, but it was determined he was not dehydrated. During this hospitalization, he was diagnosed with Parkinson's disease due to tremors. He was started on medication for Parkinson's, which significantly improved his symptoms, allowing him to better manage daily activities such as eating.  Following his initial hospitalization, he stayed for a week of in-house rehabilitation, which was beneficial. However, after returning home, he was readmitted due to hospital-acquired pneumonia. His recovery from pneumonia has been slower than expected, and he has not resumed physical therapy due to the inability to arrange home health services.  His daughter reports that he has been using a walker effectively but has not been eating well, with a preference for junk food over regular meals. He lost weight during his hospital stay and has a decreased appetite, although he managed to consume a vanilla shake mixed with coffee while in the hospital.  He has been using a nebulizer with a mask, which is more comfortable for him than a mouthpiece. His current medications include Eliquis , which has been refilled, but he has not yet obtained aspirin .  His right foot and leg have been problematic, sometimes requiring him to physically lift his leg to move it, especially when getting into a car. He walks better in bedroom shoes or nurse shoes.  His  sleep schedule has been adjusted to prevent day-night reversal, and he is currently sleeping from around 8 PM to 8 AM. His daughter is managing his care at home, including assisting with bathing using a makeshift shower chair.  There is a concern about his toenails, which are becoming difficult to manage, and a referral to a podiatrist is being considered. His daughter is also exploring options for physical therapy outside the home, as home health services are not feasible.          10/03/2023    8:39 AM 04/28/2023    3:39 PM 04/04/2023    8:10 AM 12/22/2021    9:14 AM 09/08/2020    2:25 PM  Depression screen PHQ 2/9  Decreased Interest 0 0 0 0 0  Down, Depressed, Hopeless 0 0 0 0 0  PHQ - 2 Score 0 0 0 0 0        03/12/2024   10:07 AM  Fall Risk   Falls in the past year? 1  Number falls in past yr: 0  Injury with Fall? 0    Patient Care Team: Milon Cleaves, PA as PCP - General (Physician Assistant)   Review of Systems  Constitutional:  Positive for appetite change. Negative for fatigue and fever.  HENT:  Negative for congestion, ear pain, sinus pressure and sore throat.   Respiratory:  Negative for cough, chest tightness, shortness of breath and wheezing.   Cardiovascular:  Negative for chest pain and palpitations.  Gastrointestinal:  Negative for abdominal pain, constipation, diarrhea, nausea and vomiting.  Genitourinary:  Negative for dysuria and hematuria.  Musculoskeletal:  Negative for arthralgias, back pain, joint swelling and myalgias.  Skin:  Negative for rash.  Neurological:  Negative for dizziness, weakness and headaches.  Psychiatric/Behavioral:  Negative for dysphoric mood. The patient is not nervous/anxious.     No current facility-administered medications on file prior to visit.   Current Outpatient Medications on File Prior to Visit  Medication Sig Dispense Refill   apixaban  (ELIQUIS ) 2.5 MG TABS tablet Take 1 tablet (2.5 mg total) by mouth 2 (two) times  daily. 180 tablet 3   aspirin  EC 81 MG tablet Take 81 mg by mouth daily. Swallow whole.     bacitracin  ointment Apply 1 Application topically 2 (two) times daily. (Patient not taking: Reported on 04/27/2024) 120 g 0   carbidopa -levodopa  (SINEMET  IR) 25-100 MG tablet Take 1 tablet by mouth 3 (three) times daily.     ipratropium-albuterol  (DUONEB) 0.5-2.5 (3) MG/3ML SOLN Take 3 mLs by nebulization every 6 (six) hours as needed.     levothyroxine  (SYNTHROID ) 75 MCG tablet Take 75 mcg by mouth daily before breakfast.     melatonin 3 MG TABS tablet Take 6 mg by mouth at bedtime as needed.     memantine  (NAMENDA ) 10 MG tablet TAKE 1 TABLET BY MOUTH TWICE A DAY 180 tablet 1   simvastatin  (ZOCOR ) 20 MG tablet TAKE 1 TABLET BY MOUTH EVERY DAY 30 tablet 0   verapamil  (CALAN -SR) 120 MG CR tablet Take 120 mg by mouth daily.     Past Medical History:  Diagnosis Date   Adult BMI <19 kg/sq m 01/21/2020   Alzheimer disease (HCC)    Atherosclerotic heart disease of native coronary artery without angina pectoris    CHRONIC    Atrial fibrillation (HCC) 06/03/2022   B12 deficiency    CHRONIC   Bilateral carotid bruits 01/28/2020   Chronic atrial fibrillation (HCC) 01/21/2020   CKD (chronic kidney disease), stage II    MILD   CKD (chronic kidney disease), stage III (HCC)    MODERATE (CHRONIC)   Contusion of left knee 09/28/2021   Coronary atherosclerosis    CHRONIC   Essential hypertension 01/28/2020   Essential hypertension, benign    Essential tremor    CHRONIC   Malnutrition of moderate degree (HCC) 01/21/2020   Memory deficit 09/08/2020   Mixed hearing loss, bilateral    CHRONIC   Mixed hyperlipidemia    CHRONIC    Neck pain 07/01/2021   Obstructive chronic bronchitis without exacerbation (HCC) 01/21/2020   Osteoarthritis    OF KNEE CHRONIC   Personal history of malignant neoplasm of larynx 01/18/2020   Primary hypothyroidism 01/22/2020   Unstable gait 01/21/2020   Past Surgical History:   Procedure Laterality Date   CATARACT EXTRACTION, BILATERAL      Family History  Problem Relation Age of Onset   Hypertension Neg Hx    Heart disease Neg Hx    Cancer Neg Hx    Diabetes Neg Hx    Social History   Socioeconomic History   Marital status: Widowed    Spouse name: Not on file   Number of children: 2   Years of education: Not on file   Highest education level: Not on file  Occupational History   Occupation: retired   Occupation: Warehouse manager  Tobacco Use   Smoking status: Former    Current packs/day: 0.00    Types: Cigarettes    Quit date: 2008    Years since quitting: 17.6   Smokeless tobacco: Current  Types: Chew   Tobacco comments:    1 pouch sometimes  Vaping Use   Vaping status: Never Used  Substance and Sexual Activity   Alcohol  use: No   Drug use: No   Sexual activity: Yes  Other Topics Concern   Not on file  Social History Narrative   Not on file   Social Drivers of Health   Financial Resource Strain: Low Risk  (04/04/2023)   Overall Financial Resource Strain (CARDIA)    Difficulty of Paying Living Expenses: Not hard at all  Food Insecurity: Patient Unable To Answer (04/28/2024)   Hunger Vital Sign    Worried About Running Out of Food in the Last Year: Patient unable to answer    Ran Out of Food in the Last Year: Patient unable to answer  Transportation Needs: Patient Unable To Answer (04/28/2024)   PRAPARE - Transportation    Lack of Transportation (Medical): Patient unable to answer    Lack of Transportation (Non-Medical): Patient unable to answer  Physical Activity: Sufficiently Active (04/04/2023)   Exercise Vital Sign    Days of Exercise per Week: 5 days    Minutes of Exercise per Session: 60 min  Stress: No Stress Concern Present (04/04/2023)   [employer] of Occupational Health - Occupational Stress Questionnaire    Feeling of Stress : Not at all  Social Connections: Unknown (04/28/2024)   Social Connection and Isolation  Panel    Frequency of Communication with Friends and Family: Patient unable to answer    Frequency of Social Gatherings with Friends and Family: Patient unable to answer    Attends Religious Services: Patient unable to answer    Active Member of Clubs or Organizations: Not on file    Attends Banker Meetings: Patient unable to answer    Marital Status: Patient unable to answer    Objective:  BP 108/68 (BP Location: Right Arm, Patient Position: Sitting)   Pulse 91   Temp (!) 97.1 F (36.2 C) (Temporal)   Ht 5' 6 (1.676 m)   Wt 93 lb (42.2 kg)   SpO2 92%   BMI 15.01 kg/m      04/28/2024   10:00 PM 04/28/2024    9:30 PM 04/28/2024    9:00 PM  BP/Weight  Systolic BP 79 110 82  Diastolic BP 50 93 50    Physical Exam Constitutional:      Appearance: He is underweight. He is ill-appearing.  Cardiovascular:     Rate and Rhythm: Normal rate.     Heart sounds: No murmur heard.    No friction rub.  Pulmonary:     Effort: Pulmonary effort is normal.     Breath sounds: Decreased air movement present. No wheezing, rhonchi or rales.  Abdominal:     General: Abdomen is flat.     Palpations: Abdomen is soft.     Tenderness: There is no abdominal tenderness.  Neurological:     Mental Status: He is alert.     Motor: Weakness and tremor present.      Lab Results  Component Value Date   WBC 4.3 04/28/2024   HGB 11.5 (L) 04/28/2024   HCT 34.4 (L) 04/28/2024   PLT 108 (L) 04/28/2024   GLUCOSE 106 (H) 04/28/2024   CHOL 179 10/03/2023   TRIG 63 10/03/2023   HDL 61 10/03/2023   LDLCALC 106 (H) 10/03/2023   ALT 47 (H) 04/28/2024   AST 51 (H) 04/28/2024   NA 142 04/28/2024  K 4.1 04/28/2024   CL 109 04/28/2024   CREATININE 1.10 04/28/2024   BUN 28 (H) 04/28/2024   CO2 25 04/28/2024   TSH 3.850 02/21/2024   INR 1.7 (H) 04/28/2024   HGBA1C 5.5 10/03/2023      Assessment & Plan:  Unstable gait Assessment & Plan: Difficulty with ADLs due to lower extremity  weakness and Parkinson's disease. Requires assistance with mobility and some personal care tasks. - Refer to podiatry for toenail care. - Consider use of removable shower head for easier bathing. - Consider use of shower chair for safety during bathing.  Orders: -     Ambulatory referral to Physical Therapy  Essential tremor Assessment & Plan: Difficulty with ADL Will send for physical therapy Continue to work on strength.   Orders: -     Ambulatory referral to Physical Therapy  IGTN (ingrowing toe nail) Assessment & Plan: Concerns for toenails becoming ingrown Will send Podiatry referral Continue to monitor  Orders: -     Ambulatory referral to Podiatry  COPD exacerbation (HCC) Assessment & Plan: Uncontrolled Continues to struggle with SOB Will send Nebulizer prescription Continue to monitor symptoms Low threshold for hospital return due to difficulties with respiration   Parkinson's disease with fluctuating manifestations, unspecified whether dyskinesia present [facility]) Assessment & Plan: Parkinson's disease diagnosed during recent hospitalization. Medication improved symptoms, but gait abnormality and lower extremity weakness persist, affecting mobility. - Refer to [facility] for physical therapy. - Encourage use of rollator with supervision for mobility.   Unintentional weight loss Assessment & Plan: Significant weight loss noted during hospitalization. Poor appetite persists, with preference for junk food over balanced meals. - Consider medication to increase appetite. - Encourage consumption of Ensure mixed with coffee to increase caloric intake.     Insomnia and sleep-wake cycle disturbance Reports of mixed sleep-wake cycle, with some improvement noted. Current sleep pattern includes approximately 12 hours of sleep per night. - Encourage consistent sleep schedule, aiming for 8 PM to 8 AM sleep pattern.     No orders of the defined types were placed  in this encounter.   Orders Placed This Encounter  Procedures   Ambulatory referral to Physical Therapy   Ambulatory referral to Podiatry   Follow-up: No follow-ups on file.   I,Lauren M Auman,acting as a Neurosurgeon for [employer], PA.,have documented all relevant documentation on the behalf of Nola Angles, PA,as directed by  Nola Angles, PA while in the presence of Nola Angles, [place].   An After Visit Summary was printed and given to the patient.  Nola Angles, [place] [facility] [phone]

## 2024-04-19 ENCOUNTER — Ambulatory Visit: Admitting: Physical Therapy

## 2024-04-20 ENCOUNTER — Telehealth: Payer: Self-pay

## 2024-04-20 NOTE — Telephone Encounter (Signed)
 Copied from CRM [id number]. Topic: Clinical - Prescription Issue >> Apr 20, 2024  8:46 AM Elle L wrote: Reason for CRM: The Pharmacist Joe at [facility] states he received a prescription for verapamil  (CALAN -SR) 120 MG CR tablet from another Provider at the end of July and verapamil  (CALAN -SR) 240 MG on July 8th from [place] [facility] and he wants to see which medication the patient should be taking.

## 2024-04-22 NOTE — ED Provider Notes (Signed)
 [facility] EMERGENCY DEPARTMENT ENCOUNTER    Clinical Impression   Final diagnoses:  None   Assessment and Plan   Initial Impression, including Differential Diagnosis and Plan of Care  Lucas Kline is a 89 y.o. male with PMH of HTN, a-fib, COPD, Alzheimer's disease, and pneumonia who presents with weakness, which began 3 weeks ago but worsened 2 days ago. Pt was hospitalized for weakness 3 weeks ago, but improved and was discharged.  Triage vital signs were stable and relatively WNL.   On physical exam, pt is cachectic, extremely frail, laying in bed, and in no acute distress. He has dry lips and tongue. Pt has false dentition. He has muffled heart sounds with a regular pulse. Pt has distant breath sounds which were diminished throughout. There is no edema. He has superficial scratches on bilateral forearms with no other wounds.   Differential diagnosis includes, but is not limited to: Acute infectious process, electrolyte perturbation, severe dehydration, low suspicion for stroke or acute coronary syndrome given chronicity of the problem.  I strongly expected the patient is experiencing failure to thrive, which has been explained to the family in the past.  His elevated dentures are also contributing to his cachexia and nutritional deficits, as well as recurrent aspiration when trying to eat.  Attempted to work with family to minimize interventions, but they are having a hard time accepting his decline at this time.  ED Course  CBC, CMP, urinalysis obtained.  Given slow bolus of IV fluids, 500 mL of lactated Ringer's.  Patient has been able to take p.o. very well.  Patient's labs are reassuring, no evidence of acute infectious process.  Discussed again failure to thrive and the elderly with the patient's family.  Daughter is appropriately tearful as she is starting to understand.  Patient and family are on board with taking him home at this time.  I encouraged them to consider home  health, palliative care, or hospice at this time.  They will discuss as a family and continue to consider.   __________________________________________  Time seen: April 22, 2024 10:44 AM  I have reviewed the triage vital signs and the nursing notes.  History    Chief Complaint  Patient presents with  . Weakness   HPI  Lucas Kline is a 89 y.o. male with PMH of HTN, a-fib, COPD, Alzheimer's disease, and pneumonia who presents with weakness, which began 3 weeks ago but worsened 2 days ago. Pt was hospitalized for weakness 3 weeks ago, but improved and was discharged. Per pt's son, pt grew weak again after discharge so presented again to the ED 1 week later. Pt was then admitted for pneumonia and treated with Vancomycin  and Zosyn . He completed a full course of abx while hospitalized and was discharged with [facility]. Pt's son reports that pt recovered well from his pneumonia at first, but has been gradually growing weaker in the last 2 days. He states pt has been slower to respond. He is concerned that pt has pneumonia again. Pt's son endorses diarrhea, dysphagia, aspiration, dehydration and weight loss. Pt states he had episodes of diarrhea this morning, but pt's son has not seen it himself. He denies emesis and loss of appetite. Pt's son notes that pt is typically more complaint with nurses than he is with his son or daughter.   Of note, pt's son reports that pt has been mostly independent. He was able to dress himself and ambulate independently before growing weaker. He reports pt has had  2 falls in the last week but sustained no injuries. He is worried pt will fall in the future.   Pt's son, daughter, and granddaughter are present at bedside and assisted with history.    PAST MEDICAL HISTORY   Past Medical History[1]  SURGICAL HISTORY   Past Surgical History[2]  CURRENT MEDICATIONS   No current facility-administered medications for this encounter.  Current Outpatient Medications:   .  apixaban  (ELIQUIS ) 2.5 mg Tab, Take 1 tablet (2.5 mg total) by mouth two (2) times a day., Disp: 60 tablet, Rfl: 0 .  aspirin  (ECOTRIN) 81 MG tablet, Take 1 tablet (81 mg total) by mouth daily., Disp: 30 tablet, Rfl: 2 .  carbidopa -levodopa  (SINEMET ) 25-100 mg per tablet, Take 1 tablet by mouth Three (3) times a day., Disp: 90 tablet, Rfl: 11 .  ipratropium-albuterol  (DUO-NEB) 0.5-2.5 mg/3 mL nebulizer, Inhale 3 mL by nebulization every six (6) hours as needed., Disp: 30 mL, Rfl: 3 .  levothyroxine  (SYNTHROID ) 75 MCG tablet, Take 1 tablet (75 mcg total) by mouth daily., Disp: 90 tablet, Rfl: 0 .  melatonin 3 mg Tab, Take 2 tablets (6 mg total) by mouth nightly as needed (sleep)., Disp: , Rfl:  .  memantine  (NAMENDA ) 10 MG tablet, Take 1 tablet (10 mg total) by mouth two (2) times a day., Disp: 60 tablet, Rfl: 2 .  simvastatin  (ZOCOR ) 20 MG tablet, Take 1 tablet (20 mg total) by mouth in the morning., Disp: , Rfl:  .  verapamil  (CALAN -SR) 120 MG CR tablet, Take 1 tablet (120 mg total) by mouth daily., Disp: 100 tablet, Rfl: 3  ALLERGIES   Allergies[3]  FAMILY HISTORY   Family History[4]  SOCIAL HISTORY Social History[5] Review of Systems   Positive focal weakness. Patient denies fever, chills  No headache No eye pain or vision changes No ear pain or decreased hearing No sore throat or difficulty swallowing No chest pain No cough or shortness of breath Positive diarrhea. No nausea, vomiting, or bloody stools No joint pain or swelling No skin rashes or lesions No syncope or seizure activity Denies depressed mood or thought disturbance  10 point ROS negative except as marked above and in HPI.  Physical Exam   VITAL SIGNS:   ED Triage Vitals [04/22/24 1012]  Enc Vitals Group     BP 124/67     Pulse 77     SpO2 Pulse 77     Resp 16     Temp 36.1 C (97 F)     Temp src      SpO2 98 %     Weight 39.5 kg (87 lb 1.6 oz)    General: Cachectic. Extremely frail. in no  distress Oral: Dry lips. Dry tongue. False dentition. Benign oropharynx Nose: Nares clear without discharge Neck: Neck supple without nodes, normal thyroid  Cardiac: Muffled heart sounds. Regular pulse.  RRR without murmurs, no JVD, no bruits  Pulm: Distant breath sounds diminished throughout. No W/R/C Abd: Soft, non-tender, not distended, normal bowel sounds, no HSM, no CVA tenderness Vascular: No peripheral edema. Ext warm and well perfused,  Extremities: No painful or swollen joints, ambulates without assistance Skin: Superficial scratches on bilateral forearms. No other wounds. Warm, dry, intact.  No rashes, lesions, or open sores Neuro: No focal deficits Psych: clear and appropriate, full affect, denies depressed mood  Results    RADIOLOGY   No results found.  LABS Recent Results (from the past 24 hours)  Comprehensive Metabolic Panel   Collection Time:  04/22/24 11:19 AM  Result Value Ref Range   Sodium 138 135 - 145 mmol/L   Potassium 4.8 3.5 - 5.0 mmol/L   Chloride 102 98 - 107 mmol/L   CO2 26.0 22.0 - 32.0 mmol/L   Anion Gap 10 5 - 14 mmol/L   BUN 21 7 - 21 mg/dL   Creatinine 8.99 9.29 - 1.30 mg/dL   BUN/Creatinine Ratio 21    eGFR CKD-EPI (2021) Male 73 >=60 mL/min/1.73m2   Glucose 108 (H) 74 - 106 mg/dL   Calcium 8.9 8.5 - 89.7 mg/dL   Albumin 3.8 3.4 - 5.0 g/dL   Total Protein 7.2 6.5 - 8.3 g/dL   Total Bilirubin 1.0 0.1 - 1.2 mg/dL   AST 26 19 - 55 U/L   ALT 12 <50 U/L   Alkaline Phosphatase 51 38 - 126 U/L  CBC w/ Differential   Collection Time: 04/22/24 11:19 AM  Result Value Ref Range   WBC 5.0 3.6 - 11.2 10*9/L   RBC 4.04 (L) 4.26 - 5.60 10*12/L   HGB 13.0 12.9 - 16.5 g/dL   HCT 62.2 (L) 60.9 - 51.9 %   MCV 93.2 77.6 - 95.7 fL   MCH 32.0 25.9 - 32.4 pg   MCHC 34.4 32.0 - 36.0 g/dL   RDW 85.4 87.7 - 84.7 %   MPV 7.5 6.8 - 10.7 fL   Platelet 171 150 - 450 10*9/L   nRBC 0 <=4 /100 WBCs   Neutrophils % 83.2 %   Lymphocytes % 9.4 %   Monocytes % 6.5  %   Eosinophils % 0.3 %   Basophils % 0.6 %   Absolute Neutrophils 4.2 1.8 - 7.8 10*9/L   Absolute Lymphocytes 0.5 (L) 1.1 - 3.6 10*9/L   Absolute Monocytes 0.3 0.3 - 0.8 10*9/L   Absolute Eosinophils 0.0 0.0 - 0.5 10*9/L   Absolute Basophils 0.0 0.0 - 0.1 10*9/L  Urinalysis with Microscopy   Collection Time: 04/22/24  3:38 PM  Result Value Ref Range   Color, UA Yellow    Clarity, UA Hazy    Specific Gravity, UA >=1.030 1.005 - 1.030   pH, UA 5.5 5.0 - 7.0   Leukocyte Esterase, UA Negative Negative   Nitrite, UA Negative Negative   Protein, UA Trace (A) Negative   Glucose, UA Negative Negative   Ketones, UA Negative Negative   Urobilinogen, UA 0.2 mg/dL    Bilirubin, UA Small (A) Negative   Blood, UA Negative Negative   RBC, UA <1 0 - 3 /HPF   WBC, UA 3 0 - 3 /HPF   Squam Epithel, UA 3 0 - 5 /HPF   Bacteria, UA Few (A) None Seen /HPF   Hyaline Casts, UA 2 (H) <=0 /LPF   Mucus, UA Moderate (A) None Seen /HPF      Procedures   None  Attestations   Portions of this record have been created using Scientist, clinical (histocompatibility and immunogenetics). Dictation errors have been sought, but may not have been identified and corrected.  The chart was initially created by Varshini Venuturupalle using scribe services on April 22, 2024 10:44 AM, on behalf of Nidia Lines, MD.  Documentation assistance was provided by the scribe in my presence. The documentation recorded by the scribe has been reviewed by me, amended with [employer], and accurately reflects the services I personally performed.        [1] Past Medical History: Diagnosis Date  . A-fib      . Alzheimer disease   .  COPD (chronic obstructive pulmonary disease)      . Coronary arteriosclerosis   . Hypertension   . Renal disorder   [2] Past Surgical History: Procedure Laterality Date  . CARDIAC CATHETERIZATION  2000  [3] Allergies Allergen Reactions  . Donepezil Other (See Comments)    Abdominal pain, diarrhea   [4] History reviewed. No pertinent family history. [5] Social History Socioeconomic History  . Marital status: Widowed    Spouse name: None  . Number of children: None  . Years of education: None  . Highest education level: None  Tobacco Use  . Smoking status: Never    Passive exposure: Never  . Smokeless tobacco: Current    Types: Chew  Vaping Use  . Vaping status: Never Used  Substance and Sexual Activity  . Alcohol  use: Not Currently  . Drug use: Never   Social Drivers of Corporate investment banker Strain: Low Risk  (04/04/2023)   Received from [facility]   Overall Financial Resource Strain (CARDIA)   . Difficulty of Paying Living Expenses: Not hard at all  Food Insecurity: No Food Insecurity (03/27/2024)   Hunger Vital Sign   . Worried About Programme researcher, broadcasting/film/video in the Last Year: Never true   . Ran Out of Food in the Last Year: Never true  Transportation Needs: No Transportation Needs (03/27/2024)   PRAPARE - Transportation   . Lack of Transportation (Medical): No   . Lack of Transportation (Non-Medical): No  Physical Activity: Sufficiently Active (04/04/2023)   Received from [facility]   Exercise Vital Sign   . On average, how many days per week do you engage in moderate to strenuous exercise (like a brisk walk)?: 5 days   . On average, how many minutes do you engage in exercise at this level?: 60 min  Stress: No Stress Concern Present (04/04/2023)   Received from [facility] - Occupational Stress Questionnaire   . Feeling of Stress : Not at all  Social Connections: Moderately Isolated (04/04/2023)   Received from [facility]   Social Connection and Isolation Panel   . In a typical week, how many times do you talk on the phone with family, friends, or neighbors?: Once a week   . How often do you get together with friends or relatives?: More than three times a week   . How often do you attend church or religious services?:  Never   . Do you belong to any clubs or organizations such as church groups, unions, fraternal or athletic groups, or school groups?: No   . How often do you attend meetings of the clubs or organizations you belong to?: More than 4 times per year   . Are you married, widowed, divorced, separated, never married, or living with a partner?: Widowed  Housing: Low Risk  (03/27/2024)   Housing   . Within the past 12 months, have you ever stayed: outside, in a car, in a tent, in an overnight shelter, or temporarily in someone else's home (i.e. couch-surfing)?: No   . Are you worried about losing your housing?: No   Crites, Nidia Ruts, MD 04/22/24 1720

## 2024-04-23 ENCOUNTER — Ambulatory Visit: Admitting: Physical Therapy

## 2024-04-24 ENCOUNTER — Ambulatory Visit: Admitting: Podiatry

## 2024-04-24 ENCOUNTER — Encounter: Payer: Self-pay | Admitting: Podiatry

## 2024-04-24 ENCOUNTER — Ambulatory Visit: Attending: Physician Assistant

## 2024-04-24 VITALS — Ht 66.0 in | Wt 93.0 lb

## 2024-04-24 DIAGNOSIS — R2681 Unsteadiness on feet: Secondary | ICD-10-CM | POA: Insufficient documentation

## 2024-04-24 DIAGNOSIS — B351 Tinea unguium: Secondary | ICD-10-CM

## 2024-04-24 DIAGNOSIS — G250 Essential tremor: Secondary | ICD-10-CM | POA: Insufficient documentation

## 2024-04-24 DIAGNOSIS — M79674 Pain in right toe(s): Secondary | ICD-10-CM | POA: Diagnosis not present

## 2024-04-24 DIAGNOSIS — M79675 Pain in left toe(s): Secondary | ICD-10-CM | POA: Diagnosis not present

## 2024-04-24 DIAGNOSIS — R269 Unspecified abnormalities of gait and mobility: Secondary | ICD-10-CM | POA: Insufficient documentation

## 2024-04-24 DIAGNOSIS — R262 Difficulty in walking, not elsewhere classified: Secondary | ICD-10-CM | POA: Diagnosis present

## 2024-04-24 DIAGNOSIS — R531 Weakness: Secondary | ICD-10-CM | POA: Insufficient documentation

## 2024-04-24 NOTE — Progress Notes (Signed)
   Chief Complaint  Patient presents with   Nail Problem    Pt is here for RFC.    SUBJECTIVE Patient presents to office today complaining of elongated, thickened nails that cause pain while ambulating in shoes.  Patient is unable to trim their own nails. Patient is here for further evaluation and treatment.  Past Medical History:  Diagnosis Date   Adult BMI <19 kg/sq m 01/21/2020   Alzheimer disease (HCC)    Atherosclerotic heart disease of native coronary artery without angina pectoris    CHRONIC    Atrial fibrillation (HCC) 06/03/2022   B12 deficiency    CHRONIC   Bilateral carotid bruits 01/28/2020   Chronic atrial fibrillation (HCC) 01/21/2020   CKD (chronic kidney disease), stage II    MILD   CKD (chronic kidney disease), stage III (HCC)    MODERATE (CHRONIC)   Contusion of left knee 09/28/2021   Coronary atherosclerosis    CHRONIC   Essential hypertension 01/28/2020   Essential hypertension, benign    Essential tremor    CHRONIC   Malnutrition of moderate degree (HCC) 01/21/2020   Memory deficit 09/08/2020   Mixed hearing loss, bilateral    CHRONIC   Mixed hyperlipidemia    CHRONIC    Neck pain 07/01/2021   Obstructive chronic bronchitis without exacerbation (HCC) 01/21/2020   Osteoarthritis    OF KNEE CHRONIC   Personal history of malignant neoplasm of larynx 01/18/2020   Primary hypothyroidism 01/22/2020   Unstable gait 01/21/2020    Allergies  Allergen Reactions   Aricept [Donepezil] Other (See Comments)    Abdominal pain, diarrhea     OBJECTIVE General Patient is awake, alert, and oriented x 3 and in no acute distress. Derm Skin is dry and supple bilateral. Negative open lesions or macerations. Remaining integument unremarkable. Nails are tender, long, thickened and dystrophic with subungual debris, consistent with onychomycosis, 1-5 bilateral. No signs of infection noted. Vasc  DP and PT pedal pulses palpable bilaterally. Temperature gradient  within normal limits.  Neuro Epicritic and protective threshold sensation grossly intact bilaterally.  Musculoskeletal Exam No symptomatic pedal deformities noted bilateral. Muscular strength within normal limits.  ASSESSMENT 1.  Pain due to onychomycosis of toenails both  PLAN OF CARE 1. Patient evaluated today.  2. Instructed to maintain good pedal hygiene and foot care.  3. Mechanical debridement of nails 1-5 bilaterally performed using a nail nipper. Filed with dremel without incident.  4. Return to clinic in 3 mos. rfc   Thresa EMERSON Sar, DPM [facility]  Dr. Thresa EMERSON Sar, DPM    [address] [place], [place] [address]                Office [phone]  Fax [phone]

## 2024-04-24 NOTE — Telephone Encounter (Signed)
 Called and informed Lucas Kline at the pharmacy that the patient is only on Verapamil  120 mg and not the 240 because it was decreased at his last hospital admission.

## 2024-04-24 NOTE — Therapy (Signed)
 OUTPATIENT PHYSICAL THERAPY EVALUATION  Patient Name: Lucas Kline MRN: [id number] DOB:1929-09-11, 89 y.o., male Today's Date: 04/24/2024  PCP: Lucas Cleaves, PA  REFERRING PROVIDER: Milon Cleaves, PA   END OF SESSION:   PT End of Session - 04/24/24 1149     Visit Number 1    Number of Visits 24    Date for PT Re-Evaluation 06/19/24    Authorization Type [employer]    Progress Note Due on Visit 10    PT Start Time 1150    PT Stop Time 1230    PT Time Calculation (min) 40 min    Equipment Utilized During Treatment Gait belt    Activity Tolerance Patient tolerated treatment well;No increased pain;Patient limited by fatigue    Behavior During Therapy [facility] for tasks assessed/performed          Past Medical History:  Diagnosis Date   Adult BMI <19 kg/sq m 01/21/2020   Alzheimer disease (HCC)    Atherosclerotic heart disease of native coronary artery without angina pectoris    CHRONIC    Atrial fibrillation (HCC) 06/03/2022   B12 deficiency    CHRONIC   Bilateral carotid bruits 01/28/2020   Chronic atrial fibrillation (HCC) 01/21/2020   CKD (chronic kidney disease), stage II    MILD   CKD (chronic kidney disease), stage III (HCC)    MODERATE (CHRONIC)   Contusion of left knee 09/28/2021   Coronary atherosclerosis    CHRONIC   Essential hypertension 01/28/2020   Essential hypertension, benign    Essential tremor    CHRONIC   Malnutrition of moderate degree (HCC) 01/21/2020   Memory deficit 09/08/2020   Mixed hearing loss, bilateral    CHRONIC   Mixed hyperlipidemia    CHRONIC    Neck pain 07/01/2021   Obstructive chronic bronchitis without exacerbation (HCC) 01/21/2020   Osteoarthritis    OF KNEE CHRONIC   Personal history of malignant neoplasm of larynx 01/18/2020   Primary hypothyroidism 01/22/2020   Unstable gait 01/21/2020   Past Surgical History:  Procedure Laterality Date   CATARACT EXTRACTION, BILATERAL     Patient Active Problem List    Diagnosis Date Noted   COPD exacerbation (HCC) 02/21/2024   Cat bite of hand, left, subsequent encounter 10/18/2023   Cellulitis of left hand 10/18/2023   Coronary atherosclerosis 07/21/2022   Adult BMI <19 kg/sq m 12/22/2021   Neck pain 07/01/2021   Memory deficit 09/08/2020   B12 deficiency    Essential tremor    Mixed hearing loss, bilateral    CKD (chronic kidney disease), stage III (HCC) 01/28/2020   Bilateral carotid bruits 01/28/2020   Essential hypertension 01/28/2020   Primary hypothyroidism 01/22/2020   Unstable gait 01/21/2020   Obstructive chronic bronchitis without exacerbation (HCC) 01/21/2020   Malnutrition of moderate degree (HCC) 01/21/2020   Chronic atrial fibrillation (HCC) 01/21/2020   Personal history of malignant neoplasm of larynx 01/18/2020   Osteoarthritis    Alzheimer disease (HCC)    Atherosclerotic heart disease of native coronary artery without angina pectoris    Mixed hyperlipidemia     ONSET DATE: A few months ago  REFERRING DIAG: R26.81 (ICD-10-CM) - Unstable gait   THERAPY DIAG:  Generalized weakness  Difficulty in walking  Unsteadiness on feet  Abnormality of gait  Rationale for Evaluation and Treatment: Rehabilitation  SUBJECTIVE:  SUBJECTIVE STATEMENT: Subjective collected from DTR. Since last visit, pt was acutely ill, hos;italized, DC to AIR at [facility], then returned after DC with [facility], and then returned to home finally on 04/10/24. Pt then back to ED on 8/10 for weakness, diarrhea, noted to be dehydrate, then DC back straight to home.    PERTINENT HISTORY:  04/24/24 Patient arrives in transport chair with his daughter, Lucas Kline.Pt has had a decline with AMB in pat few months, weakness of legs. Pt's daughter assisted with subjective history. As recently  as March was walking 2 miles a day. DTR reports progressive weight loss this year most recently down to 93lb c PCP on 04/18/24, previously ~130lbs. Pt started OPPT here for 4 visits prior to readmission, then DC to AIR then home 04/10/24. Pt had aspiration PNA during that course. DTR reports recommendations for thickened liquids and that pt refuses anything with thickeners hence they have not been able to comply with this given his dehydration and cachexia. Pt DC to home in July with RW, only walking household distances with minGuardA, frequent episodes of c/o legs giving out. PMH significant for Alzheimer's, a-fib, B12 deficiency, HTN,, Stage 3a chronic kidney disease, COPD.    Pt accompanied by: daughter, Lucas Kline  PAIN:  Are you having pain? Pt says no, congitive changes make reporting symptoms unreliable at times.   PRECAUTIONS: Fall  WEIGHT BEARING RESTRICTIONS: No  FALLS: Has patient fallen in last 6 months? No  LIVING ENVIRONMENT: Lives with: Lives with son, son works from home; DTR and grandDTR help with medical visits, etc.   Lives in: House Stairs: Yes: External: 3-4 steps; on left going up and patient also has 1 step inside the house to step up/down into a utility room Has following equipment at home: RW in house modI, has a rollator that has been deemed unsafe for him at baseline; has a transport chair.   PLOF: Independent, walking 2 miles/day approximately back in March 2025  PATIENT GOALS: get his legs stronger and be able to walk better  OBJECTIVE:  Note: Objective measures were completed at Evaluation unless otherwise noted.  FUNCTIONAL TESTS:  -STS transfer from transport chair to RW: minGuardA to rise, MinA to steady during balance transition -STS from guest chair (no arms): minA with BUE push off from seat surface, MinA to steady during balance transition -AMB to failure: 30ft with RW, minGuardA without LOB, requires cues for navigation; unable to get consistent and  reliable feedback on symptoms                                                                                                                               TREATMENT DATE: 04/24/2024 -STS from transport cahir x2 -STS from cuest chair witout arms  -static balance x2 minutes for vitals assessment  -33ft AMB, then seated rest break  -23ft AMB, then seated rest break *caregiver education on features of transport chair *caregiver education on safe monitoring of  BP at home, contact PCP about any meds changes recommended when pressure is low *caregiver education on implication for SLP consult and dangers of drinking straw use   PATIENT EDUCATION: Education details: Therapy POC, results of assessments today, HEP, discussed home safety and specific concerns DTR and patient have Person educated: DTR Education method: collaborative learning, deliberate practice, positive reinforcement, explicit instruction, establish rules. Education comprehension: verbalized understanding and needs further education  HOME EXERCISE PROGRAM:   Focus on safe ADL mobility in the home, BP monitoring with PCP, and walking at church are primary focus at this time.  GOALS: Goals reviewed with patient? Yes  SHORT TERM GOALS: Target date: 05/25/2024  Patient will be independent with home exercise program to improve strength/mobility for better functional independence with ADLs.  Baseline:  Goal status: INITIAL  LONG TERM GOALS: Target date: 06/05/2024  Pt to complete 5xSTS at supervision level in less than 25 seconds.  Baseline:  Goal status: INITIAL  2.  Patient to improve AMB tolerance to >339ft overground with RW to improve safety with ADL moblity in the home.  Baseline: need to assess Goal status: INITIAL  3.  Pt to demonstrate ability to partipate in >20 minutes of sustained activity in standing to facilitate safety for ADL in the home and ability to partake in short community activities with family.   Baseline: need to assess Goal status: INITIAL  ASSESSMENT:  CLINICAL IMPRESSION: Pt presents for evaluation after acute illness, hospitalization, and return to home after AIR. Per DTR, pt has lost more weight and also lost more function overall. Pt c/o legs giving out far more than he used to. Note low pressures today in session 90s/60s, much lower than ED vitals 2 days prior- recommended DTR contact PCP about this and ask about any steps that need to be taken when pressures are low like this. Pt has not been agreeable with recommendations for thickened liquids which complicates his care at home. Patient will benefit from skilled physical therapy intervention to reduce deficits and impairments identified in evaluation, in order to reduce pain, improve quality of life, and maximize activity tolerance for ADL, IADL, and leisure/fitness. Physical therapy will help pt achieve long and short term goals of care.    OBJECTIVE IMPAIRMENTS: Abnormal gait, cardiopulmonary status limiting activity, decreased activity tolerance, decreased balance, decreased cognition, decreased endurance, decreased knowledge of use of DME, decreased mobility, difficulty walking, and decreased strength.  ACTIVITY LIMITATIONS: carrying, lifting, bending, standing, squatting, stairs, transfers, bathing, toileting, dressing, reach over head, hygiene/grooming, and locomotion level PARTICIPATION LIMITATIONS: meal prep, cleaning, laundry, shopping, community activity, and yard work PERSONAL FACTORS: Age, Social background, Time since onset of injury/illness/exacerbation, and 3+ comorbidities: Alzheimer's, a-fib, B12 deficiency, HTN,, Stage 3a chronic kidney disease, COPD are also affecting patient's functional outcome.  REHAB POTENTIAL: Poor  CLINICAL DECISION MAKING: Evolving/moderate complexity EVALUATION COMPLEXITY: Moderate  PLAN:  PT FREQUENCY: 1-2x/week  PT DURATION: 8 weeks  PLANNED INTERVENTIONS: 97164- PT  Re-evaluation, 97750- Physical Performance Testing, 97110-Therapeutic exercises, 97530- Therapeutic activity, [id number]- Neuromuscular re-education, 97535- Self Care, [id number]- Manual therapy, [id number]- Gait training, [phone]- Orthotic Initial, [phone]- Orthotic/Prosthetic subsequent, [phone]- Canalith repositioning, Patient/Family education, Balance training, Stair training, Taping, Joint mobilization, Vestibular training, Visual/preceptual remediation/compensation, Cognitive remediation, DME instructions, Cryotherapy, Moist heat, and Biofeedback  PLAN FOR NEXT SESSION:  -begin transfers and AMB training; monitor pressures before and during session.   1:12 PM, 04/24/24 Peggye JAYSON Linear, PT, DPT Physical Therapist - [facility]  Outpatient Physical Therapy-  [employer] [phone]

## 2024-04-25 ENCOUNTER — Ambulatory Visit: Admitting: Physical Therapy

## 2024-04-25 ENCOUNTER — Telehealth: Payer: Self-pay

## 2024-04-25 NOTE — Telephone Encounter (Signed)
 Copied from CRM #[id number]. Topic: Clinical - Medical Advice >> Apr 25, 2024  3:10 PM Larissa S wrote: Reason for CRM: Patient's granddaughter, Morna, calling to inform provider that patient's BP was 90/60 when sitting during PT, but returned to normal range when standing.  Callback # [phone]

## 2024-04-27 ENCOUNTER — Emergency Department

## 2024-04-27 ENCOUNTER — Ambulatory Visit

## 2024-04-27 ENCOUNTER — Other Ambulatory Visit: Payer: Self-pay

## 2024-04-27 ENCOUNTER — Encounter: Admitting: Speech Pathology

## 2024-04-27 ENCOUNTER — Inpatient Hospital Stay
Admission: EM | Admit: 2024-04-27 | Discharge: 2024-05-14 | DRG: 871 | Disposition: E | Attending: Student | Admitting: Student

## 2024-04-27 ENCOUNTER — Encounter: Payer: Self-pay | Admitting: Emergency Medicine

## 2024-04-27 DIAGNOSIS — J9602 Acute respiratory failure with hypercapnia: Secondary | ICD-10-CM | POA: Diagnosis present

## 2024-04-27 DIAGNOSIS — N182 Chronic kidney disease, stage 2 (mild): Secondary | ICD-10-CM | POA: Diagnosis present

## 2024-04-27 DIAGNOSIS — J440 Chronic obstructive pulmonary disease with acute lower respiratory infection: Secondary | ICD-10-CM | POA: Diagnosis present

## 2024-04-27 DIAGNOSIS — J9601 Acute respiratory failure with hypoxia: Secondary | ICD-10-CM | POA: Diagnosis present

## 2024-04-27 DIAGNOSIS — R7989 Other specified abnormal findings of blood chemistry: Secondary | ICD-10-CM | POA: Diagnosis present

## 2024-04-27 DIAGNOSIS — Z515 Encounter for palliative care: Secondary | ICD-10-CM | POA: Diagnosis not present

## 2024-04-27 DIAGNOSIS — J189 Pneumonia, unspecified organism: Secondary | ICD-10-CM | POA: Diagnosis present

## 2024-04-27 DIAGNOSIS — I482 Chronic atrial fibrillation, unspecified: Secondary | ICD-10-CM | POA: Diagnosis present

## 2024-04-27 DIAGNOSIS — R6521 Severe sepsis with septic shock: Secondary | ICD-10-CM | POA: Diagnosis present

## 2024-04-27 DIAGNOSIS — Z1152 Encounter for screening for COVID-19: Secondary | ICD-10-CM

## 2024-04-27 DIAGNOSIS — J439 Emphysema, unspecified: Secondary | ICD-10-CM | POA: Diagnosis present

## 2024-04-27 DIAGNOSIS — R64 Cachexia: Secondary | ICD-10-CM | POA: Diagnosis present

## 2024-04-27 DIAGNOSIS — G309 Alzheimer's disease, unspecified: Secondary | ICD-10-CM | POA: Diagnosis present

## 2024-04-27 DIAGNOSIS — E039 Hypothyroidism, unspecified: Secondary | ICD-10-CM | POA: Diagnosis present

## 2024-04-27 DIAGNOSIS — E870 Hyperosmolality and hypernatremia: Secondary | ICD-10-CM | POA: Diagnosis present

## 2024-04-27 DIAGNOSIS — R627 Adult failure to thrive: Secondary | ICD-10-CM | POA: Diagnosis present

## 2024-04-27 DIAGNOSIS — I129 Hypertensive chronic kidney disease with stage 1 through stage 4 chronic kidney disease, or unspecified chronic kidney disease: Secondary | ICD-10-CM | POA: Diagnosis present

## 2024-04-27 DIAGNOSIS — H906 Mixed conductive and sensorineural hearing loss, bilateral: Secondary | ICD-10-CM | POA: Diagnosis present

## 2024-04-27 DIAGNOSIS — Z720 Tobacco use: Secondary | ICD-10-CM

## 2024-04-27 DIAGNOSIS — J690 Pneumonitis due to inhalation of food and vomit: Secondary | ICD-10-CM | POA: Diagnosis present

## 2024-04-27 DIAGNOSIS — Z681 Body mass index (BMI) 19 or less, adult: Secondary | ICD-10-CM

## 2024-04-27 DIAGNOSIS — Z8521 Personal history of malignant neoplasm of larynx: Secondary | ICD-10-CM

## 2024-04-27 DIAGNOSIS — E782 Mixed hyperlipidemia: Secondary | ICD-10-CM | POA: Diagnosis not present

## 2024-04-27 DIAGNOSIS — Z66 Do not resuscitate: Secondary | ICD-10-CM | POA: Diagnosis present

## 2024-04-27 DIAGNOSIS — G20A1 Parkinson's disease without dyskinesia, without mention of fluctuations: Secondary | ICD-10-CM | POA: Diagnosis present

## 2024-04-27 DIAGNOSIS — Z789 Other specified health status: Secondary | ICD-10-CM | POA: Diagnosis not present

## 2024-04-27 DIAGNOSIS — Z79890 Hormone replacement therapy (postmenopausal): Secondary | ICD-10-CM

## 2024-04-27 DIAGNOSIS — Z9841 Cataract extraction status, right eye: Secondary | ICD-10-CM

## 2024-04-27 DIAGNOSIS — Z711 Person with feared health complaint in whom no diagnosis is made: Secondary | ICD-10-CM | POA: Diagnosis not present

## 2024-04-27 DIAGNOSIS — E872 Acidosis, unspecified: Secondary | ICD-10-CM | POA: Diagnosis present

## 2024-04-27 DIAGNOSIS — Z7901 Long term (current) use of anticoagulants: Secondary | ICD-10-CM

## 2024-04-27 DIAGNOSIS — A419 Sepsis, unspecified organism: Secondary | ICD-10-CM | POA: Diagnosis present

## 2024-04-27 DIAGNOSIS — A400 Sepsis due to streptococcus, group A: Secondary | ICD-10-CM

## 2024-04-27 DIAGNOSIS — F0280 Dementia in other diseases classified elsewhere without behavioral disturbance: Secondary | ICD-10-CM | POA: Diagnosis present

## 2024-04-27 DIAGNOSIS — Z7982 Long term (current) use of aspirin: Secondary | ICD-10-CM

## 2024-04-27 DIAGNOSIS — Z9842 Cataract extraction status, left eye: Secondary | ICD-10-CM

## 2024-04-27 DIAGNOSIS — G250 Essential tremor: Secondary | ICD-10-CM | POA: Diagnosis present

## 2024-04-27 DIAGNOSIS — G9341 Metabolic encephalopathy: Secondary | ICD-10-CM | POA: Diagnosis present

## 2024-04-27 DIAGNOSIS — R6520 Severe sepsis without septic shock: Secondary | ICD-10-CM | POA: Diagnosis not present

## 2024-04-27 DIAGNOSIS — Z7189 Other specified counseling: Secondary | ICD-10-CM | POA: Diagnosis not present

## 2024-04-27 DIAGNOSIS — Z79899 Other long term (current) drug therapy: Secondary | ICD-10-CM

## 2024-04-27 DIAGNOSIS — I10 Essential (primary) hypertension: Secondary | ICD-10-CM | POA: Diagnosis not present

## 2024-04-27 DIAGNOSIS — J441 Chronic obstructive pulmonary disease with (acute) exacerbation: Secondary | ICD-10-CM | POA: Diagnosis present

## 2024-04-27 DIAGNOSIS — Z888 Allergy status to other drugs, medicaments and biological substances status: Secondary | ICD-10-CM

## 2024-04-27 DIAGNOSIS — E860 Dehydration: Secondary | ICD-10-CM | POA: Diagnosis present

## 2024-04-27 DIAGNOSIS — I2510 Atherosclerotic heart disease of native coronary artery without angina pectoris: Secondary | ICD-10-CM | POA: Diagnosis present

## 2024-04-27 LAB — RESP PANEL BY RT-PCR (RSV, FLU A&B, COVID)  RVPGX2
Influenza A by PCR: NEGATIVE
Influenza B by PCR: NEGATIVE
Resp Syncytial Virus by PCR: NEGATIVE
SARS Coronavirus 2 by RT PCR: NEGATIVE

## 2024-04-27 LAB — URINALYSIS, W/ REFLEX TO CULTURE (INFECTION SUSPECTED)
Bilirubin Urine: NEGATIVE
Glucose, UA: NEGATIVE mg/dL
Hgb urine dipstick: NEGATIVE
Ketones, ur: NEGATIVE mg/dL
Leukocytes,Ua: NEGATIVE
Nitrite: NEGATIVE
Protein, ur: NEGATIVE mg/dL
Specific Gravity, Urine: >1.046 — ABNORMAL HIGH (ref 1.005–1.030)
pH: 5 (ref 5.0–8.0)

## 2024-04-27 LAB — BLOOD GAS, ARTERIAL
Acid-base deficit: 2.8 mmol/L — ABNORMAL HIGH (ref 0.0–2.0)
Bicarbonate: 24.1 mmol/L (ref 20.0–28.0)
Delivery systems: POSITIVE
FIO2: 100 %
Inspiratory PAP: 10 cmH2O
O2 Saturation: 99.8 %
PEEP: 5 cmH2O
Patient temperature: 37
pCO2 arterial: 49 mmHg — ABNORMAL HIGH (ref 32–48)
pH, Arterial: 7.3 — ABNORMAL LOW (ref 7.35–7.45)
pO2, Arterial: 220 mmHg — ABNORMAL HIGH (ref 83–108)

## 2024-04-27 LAB — CBC WITH DIFFERENTIAL/PLATELET
Abs Immature Granulocytes: 0.02 K/uL (ref 0.00–0.07)
Basophils Absolute: 0 K/uL (ref 0.0–0.1)
Basophils Relative: 0 %
Eosinophils Absolute: 0 K/uL (ref 0.0–0.5)
Eosinophils Relative: 0 %
HCT: 40.6 % (ref 39.0–52.0)
Hemoglobin: 12.9 g/dL — ABNORMAL LOW (ref 13.0–17.0)
Immature Granulocytes: 0 %
Lymphocytes Relative: 3 %
Lymphs Abs: 0.3 K/uL — ABNORMAL LOW (ref 0.7–4.0)
MCH: 31.8 pg (ref 26.0–34.0)
MCHC: 31.8 g/dL (ref 30.0–36.0)
MCV: 100 fL (ref 80.0–100.0)
Monocytes Absolute: 0.8 K/uL (ref 0.1–1.0)
Monocytes Relative: 9 %
Neutro Abs: 7.3 K/uL (ref 1.7–7.7)
Neutrophils Relative %: 88 %
Platelets: 157 K/uL (ref 150–400)
RBC: 4.06 MIL/uL — ABNORMAL LOW (ref 4.22–5.81)
RDW: 14.4 % (ref 11.5–15.5)
WBC: 8.3 K/uL (ref 4.0–10.5)
nRBC: 0 % (ref 0.0–0.2)

## 2024-04-27 LAB — BLOOD GAS, VENOUS
Acid-base deficit: 3.6 mmol/L — ABNORMAL HIGH (ref 0.0–2.0)
Acid-base deficit: 4.5 mmol/L — ABNORMAL HIGH (ref 0.0–2.0)
Bicarbonate: 29.3 mmol/L — ABNORMAL HIGH (ref 20.0–28.0)
Bicarbonate: 23.6 mmol/L (ref 20.0–28.0)
O2 Saturation: 41.6 %
O2 Saturation: 64.1 %
Patient temperature: 37
Patient temperature: 37
pCO2, Ven: 101 mmHg (ref 44–60)
pCO2, Ven: 55 mmHg (ref 44–60)
pH, Ven: 7.07 — CL (ref 7.25–7.43)
pH, Ven: 7.24 — ABNORMAL LOW (ref 7.25–7.43)
pO2, Ven: 34 mmHg (ref 32–45)
pO2, Ven: 39 mmHg (ref 32–45)

## 2024-04-27 LAB — RESPIRATORY PANEL BY PCR

## 2024-04-27 LAB — URINE DRUG SCREEN, QUALITATIVE (ARMC ONLY)
Amphetamines, Ur Screen: NOT DETECTED
Barbiturates, Ur Screen: NOT DETECTED
Benzodiazepine, Ur Scrn: NOT DETECTED
Cannabinoid 50 Ng, Ur ~~LOC~~: NOT DETECTED
Cocaine Metabolite,Ur ~~LOC~~: NOT DETECTED
MDMA (Ecstasy)Ur Screen: NOT DETECTED
Methadone Scn, Ur: NOT DETECTED
Opiate, Ur Screen: NOT DETECTED
Phencyclidine (PCP) Ur S: NOT DETECTED
Tricyclic, Ur Screen: NOT DETECTED

## 2024-04-27 LAB — LACTIC ACID, PLASMA
Lactic Acid, Venous: 4.8 mmol/L (ref 0.5–1.9)
Lactic Acid, Venous: 2.4 mmol/L (ref 0.5–1.9)

## 2024-04-27 LAB — COMPREHENSIVE METABOLIC PANEL WITH GFR
ALT: 36 U/L (ref 0–44)
AST: 75 U/L — ABNORMAL HIGH (ref 15–41)
Albumin: 3.1 g/dL — ABNORMAL LOW (ref 3.5–5.0)
Alkaline Phosphatase: 53 U/L (ref 38–126)
Anion gap: 10 (ref 5–15)
BUN: 30 mg/dL — ABNORMAL HIGH (ref 8–23)
CO2: 29 mmol/L (ref 22–32)
Calcium: 8.6 mg/dL — ABNORMAL LOW (ref 8.9–10.3)
Chloride: 107 mmol/L (ref 98–111)
Creatinine, Ser: 1.06 mg/dL (ref 0.61–1.24)
GFR, Estimated: >60 mL/min (ref >60)
Glucose, Bld: 163 mg/dL — ABNORMAL HIGH (ref 70–99)
Potassium: 4.6 mmol/L (ref 3.5–5.1)
Sodium: 146 mmol/L — ABNORMAL HIGH (ref 135–145)
Total Bilirubin: 0.6 mg/dL (ref 0.0–1.2)
Total Protein: 6.3 g/dL — ABNORMAL LOW (ref 6.5–8.1)

## 2024-04-27 LAB — PROCALCITONIN: Procalcitonin: 1.99 ng/mL

## 2024-04-27 LAB — MAGNESIUM: Magnesium: 2.3 mg/dL (ref 1.7–2.4)

## 2024-04-27 LAB — TROPONIN I (HIGH SENSITIVITY)
Troponin I (High Sensitivity): 23 ng/L — ABNORMAL HIGH (ref <18)
Troponin I (High Sensitivity): 29 ng/L — ABNORMAL HIGH (ref <18)

## 2024-04-27 LAB — PROTIME-INR
INR: 1.4 — ABNORMAL HIGH (ref 0.8–1.2)
Prothrombin Time: 17.5 s — ABNORMAL HIGH (ref 11.4–15.2)

## 2024-04-27 LAB — STREP PNEUMONIAE URINARY ANTIGEN: Strep Pneumo Urinary Antigen: NEGATIVE

## 2024-04-27 LAB — BRAIN NATRIURETIC PEPTIDE: B Natriuretic Peptide: 285.9 pg/mL — ABNORMAL HIGH (ref 0.0–100.0)

## 2024-04-27 LAB — LIPASE, BLOOD: Lipase: 31 U/L (ref 11–51)

## 2024-04-27 LAB — MRSA NEXT GEN BY PCR, NASAL: MRSA by PCR Next Gen: NOT DETECTED

## 2024-04-27 MED ORDER — IOHEXOL 350 MG/ML SOLN
75.0000 mL | Freq: Once | INTRAVENOUS | Status: AC | PRN
  Administered 2024-04-27: 75 mL via INTRAVENOUS

## 2024-04-27 MED ORDER — SODIUM CHLORIDE 0.9 % IV SOLN
2.0000 g | Freq: Once | INTRAVENOUS | Status: AC
  Administered 2024-04-27: 2 g via INTRAVENOUS
  Filled 2024-04-27: qty 12.5

## 2024-04-27 MED ORDER — ACETAMINOPHEN 325 MG PO TABS: 650.0000 mg | ORAL_TABLET | Freq: Four times a day (QID) | ORAL | Status: DC | PRN

## 2024-04-27 MED ORDER — SODIUM CHLORIDE 0.9 % IV BOLUS
1000.0000 mL | Freq: Once | INTRAVENOUS | Status: AC
  Administered 2024-04-27: 1000 mL via INTRAVENOUS

## 2024-04-27 MED ORDER — METRONIDAZOLE 500 MG/100ML IV SOLN
500.0000 mg | Freq: Once | INTRAVENOUS | Status: AC
  Administered 2024-04-27: 500 mg via INTRAVENOUS
  Filled 2024-04-27: qty 100

## 2024-04-27 MED ORDER — METHYLPREDNISOLONE SODIUM SUCC 40 MG IJ SOLR: 40.0000 mg | Freq: Every day | INTRAMUSCULAR | Status: DC

## 2024-04-27 MED ORDER — ONDANSETRON HCL 4 MG PO TABS: 4.0000 mg | ORAL_TABLET | Freq: Four times a day (QID) | ORAL | Status: DC | PRN

## 2024-04-27 MED ORDER — VANCOMYCIN HCL IN DEXTROSE 1-5 GM/200ML-% IV SOLN
1000.0000 mg | Freq: Once | INTRAVENOUS | Status: AC
  Administered 2024-04-27: 1000 mg via INTRAVENOUS
  Filled 2024-04-27: qty 200

## 2024-04-27 MED ORDER — ONDANSETRON HCL 4 MG/2ML IJ SOLN: 4.0000 mg | Freq: Four times a day (QID) | INTRAMUSCULAR | Status: DC | PRN

## 2024-04-27 MED ORDER — METHYLPREDNISOLONE SODIUM SUCC 40 MG IJ SOLR
40.0000 mg | Freq: Two times a day (BID) | INTRAMUSCULAR | Status: DC
  Administered 2024-04-27 – 2024-04-28 (×3): 40 mg via INTRAVENOUS
  Filled 2024-04-27 (×3): qty 1

## 2024-04-27 MED ORDER — SODIUM CHLORIDE 0.9 % IV SOLN
500.0000 mg | INTRAVENOUS | Status: DC
  Administered 2024-04-27 – 2024-04-28 (×2): 500 mg via INTRAVENOUS
  Filled 2024-04-27 (×2): qty 5

## 2024-04-27 MED ORDER — MORPHINE SULFATE (PF) 2 MG/ML IV SOLN
0.5000 mg | INTRAVENOUS | Status: DC | PRN
  Administered 2024-04-27: 0.5 mg via INTRAVENOUS
  Filled 2024-04-27: qty 1

## 2024-04-27 MED ORDER — VANCOMYCIN HCL 750 MG/150ML IV SOLN: 750.0000 mg | INTRAVENOUS | Status: DC

## 2024-04-27 MED ORDER — ENOXAPARIN SODIUM 30 MG/0.3ML IJ SOSY
30.0000 mg | PREFILLED_SYRINGE | INTRAMUSCULAR | Status: DC
  Administered 2024-04-27: 30 mg via SUBCUTANEOUS
  Filled 2024-04-27: qty 0.3

## 2024-04-27 MED ORDER — PIPERACILLIN-TAZOBACTAM 3.375 G IVPB
3.3750 g | Freq: Three times a day (TID) | INTRAVENOUS | Status: DC
  Administered 2024-04-27 – 2024-04-28 (×5): 3.375 g via INTRAVENOUS
  Filled 2024-04-27 (×5): qty 50

## 2024-04-27 MED ORDER — POLYETHYLENE GLYCOL 3350 17 G PO PACK: 17.0000 g | PACK | Freq: Every day | ORAL | Status: DC | PRN

## 2024-04-27 MED ORDER — CHLORHEXIDINE GLUCONATE CLOTH 2 % EX PADS
6.0000 | MEDICATED_PAD | Freq: Every day | CUTANEOUS | Status: DC
  Administered 2024-04-27 – 2024-04-29 (×3): 6 via TOPICAL

## 2024-04-27 MED ORDER — ENOXAPARIN SODIUM 40 MG/0.4ML IJ SOSY: 40.0000 mg | PREFILLED_SYRINGE | INTRAMUSCULAR | Status: DC

## 2024-04-27 MED ORDER — ORAL CARE MOUTH RINSE
15.0000 mL | OROMUCOSAL | Status: DC | PRN
Start: 2024-04-27 — End: 2024-04-29

## 2024-04-27 MED ORDER — ACETAMINOPHEN 650 MG RE SUPP: 650.0000 mg | Freq: Four times a day (QID) | RECTAL | Status: DC | PRN

## 2024-04-27 NOTE — ED Notes (Signed)
 O2 titrated to 100% r/t O2 saturations 85% with good pleth. RT notified of the same.

## 2024-04-27 NOTE — ED Notes (Signed)
 Tech Faith in sitting with patient

## 2024-04-27 NOTE — Consult Note (Signed)
 Pharmacy Antibiotic Note  Lucas Kline is a 89 y.o. male admitted on 04/27/2024 with sepsis.  Pharmacy has been consulted for vancomycin  & Zosyn  dosing.  Plan:  Zosyn  3.375g IV q8h (4 hour infusion).  Vancomycin  1 g IV followed by vancomycin  750 mg IV q48h --Calculated AUC: 434, Cmin: 9 --Scr 1.06, TBW, Vd 0.72 --Daily Scr per protocol --Levels at steady state or as clinically indicated  Height: 5' 6 (167.6 cm) Weight: 43.9 kg (96 lb 12.5 oz) IBW/kg (Calculated) : 63.8  Temp (24hrs), Avg:94.3 F (34.6 C), Min:91.6 F (33.1 C), Max:97 F (36.1 C)  Recent Labs  Lab 04/27/24 0845 04/27/24 1045  WBC 8.3  --   CREATININE 1.06  --   LATICACIDVEN 4.8* 2.4*    Estimated Creatinine Clearance: 27.6 mL/min (by C-G formula based on SCr of 1.06 mg/dL).    Allergies  Allergen Reactions   Aricept [Donepezil] Other (See Comments)    Abdominal pain, diarrhea    Antimicrobials this admission: Cefepime  8/15 x 1 Metronidazole  8/15 x 1 Zosyn  8/15 >>  Azithromycin  8/15 >>  Vancomycin  8/15 >>   Dose adjustments this admission: N/A  Microbiology results: 8/15 BCx: pending 8/15 Sputum: pending  8/15 MRSA PCR: pending  Thank you for allowing pharmacy to be a part of this patient's care.  Marolyn KATHEE Mare 04/27/2024 2:33 PM

## 2024-04-27 NOTE — Consult Note (Signed)
 NAME:  Lucas Kline, MRN:  [id number], DOB:  1929-01-28, LOS: 0 ADMISSION DATE:  04/27/2024, CONSULTATION DATE:  04/27/2024 REFERRING MD:  Dr. Ernest, CHIEF COMPLAINT:  Altered Mental Status   Brief Pt Description / Synopsis:  89 y.o. male, DNR/DNI, with PMHx significant for A.fib on Eliquis , COPD, and Alzheimer's who is admitted with Acute Metabolic Encephalopathy due to Acute Hypoxic and Hypercapnic Respiratory Failure and Sepsis in the setting of Pneumonia (HCAP vs aspiration) requiring BiPAP.  History of Present Illness:  Lucas Kline is a pleasant 89 y.o. male with medical history significant for A-fib, COPD, Alzheimer's dementia, HTN, HLD, hypothyroidism, Parkinson's disease who presented to [facility] ED on 04/27/24 after being found unresponsive at home. Upon EMS arrival, he was noted to be hypoxic with O2 saturations of 60% on room air, along with pinpoint pupils with minimal response to Narcan.  His family reported he went to bed early last night which is unusual for him, and then this morning he found him obtunded in bed.  Of note he was recently admitted discharged from the hospital 3 weeks ago for weakness and pneumonia. He was treated with vancomycin  and Zosyn  and was discharged. He attended to [facility] emergency room on 04/22/2024 for generalized weakness. He was discharged home after workup being done at the emergency room.   ED Course: Initial Vital Signs: Temperature 91.6 F rectally, respiratory rate 25, pulse 88, blood pressure 139/104, SpO2 99% Significant Labs: Sodium 146, glucose 163, BUN 30, creatinine 1.06, albumin 3.1, AST 75, BNP 285, high-sensitivity troponin 23, hemoglobin 12.9, lactic acid 4.8, procalcitonin 1.99, INR 1.4 VBG: pH 7.07/pCO2 101/pO2 30/bicarb 29.3 COVID/flu/RSV PCR negative Urinalysis negative for UTI, UDS negative Imaging Chest X-ray>>IMPRESSION: Multifocal airspace disease throughout the right lung, most pronounced in the right upper lung zone,  may be secondary to aspiration or multifocal pneumonia. CT Head wo contrast>>IMPRESSION: 1. No acute intracranial abnormality identified. 2. Mild to moderate heterogeneity in the thalami and cerebral white matter most commonly due to small vessel disease. CT Cervical Spine>>IMPRESSION: 1. No acute traumatic injury identified in the cervical spine. 2. Mild for age cervical spine degeneration superimposed on degenerative C5-C6 ankylosis. CTa Chest>>IMPRESSION: 1. Emphysema (ICD10-J43.9), with severe bullous disease and architectural distortion in the left lower lobe. Isolated possible small nonocclusive PE in that lobe, significance of which is doubtful. And no other pulmonary artery filling defect. 2. Positive for Right lung Multilobar Pneumonia with retained secretions and opacification of right lung airways, suspicious for Aspiration. Only trace right pleural effusion. 3.  Aortic Atherosclerosis (ICD10-I70.0) 4.  CT Abdomen and Pelvis reported separately. CT Abdomen & Pelvis>>IMPRESSION: 1. No acute or inflammatory process identified in mildly motion degraded abdomen and pelvis. See CTA Chest reported separately. 2.  Aortic Atherosclerosis (ICD10-I70.0). Medications Administered: 1L NS bolus, IV Cefepime , Flagyl , Vancomycin    ED provider Dr. Ernest had multiple conversations with the patients son and daughter, and they confirmed and were very adamant the patient would never be accepting of intubation under any circumstances.   TR[facility] asked to admit for further workup and treatment.  PCCM asked to consult as BP has been soft and may possibly need vasopressors.  Please see Significant Hospital Events section below for full detailed hospital course.   Pertinent  Medical History   Past Medical History:  Diagnosis Date   Adult BMI <19 kg/sq m 01/21/2020   Alzheimer disease (HCC)    Atherosclerotic heart disease of native coronary artery without angina pectoris    CHRONIC  Atrial  fibrillation (HCC) 06/03/2022   B12 deficiency    CHRONIC   Bilateral carotid bruits 01/28/2020   Chronic atrial fibrillation (HCC) 01/21/2020   CKD (chronic kidney disease), stage II    MILD   CKD (chronic kidney disease), stage III (HCC)    MODERATE (CHRONIC)   Contusion of left knee 09/28/2021   Coronary atherosclerosis    CHRONIC   Essential hypertension 01/28/2020   Essential hypertension, benign    Essential tremor    CHRONIC   Malnutrition of moderate degree (HCC) 01/21/2020   Memory deficit 09/08/2020   Mixed hearing loss, bilateral    CHRONIC   Mixed hyperlipidemia    CHRONIC    Neck pain 07/01/2021   Obstructive chronic bronchitis without exacerbation (HCC) 01/21/2020   Osteoarthritis    OF KNEE CHRONIC   Personal history of malignant neoplasm of larynx 01/18/2020   Primary hypothyroidism 01/22/2020   Unstable gait 01/21/2020    Micro Data:  8/15: Blood cultures x2>> no growth to date 8/15: RVP>> 8/15: COVID/FLU/RSV PCR>> negative 8/15: Strep pneumo urinary antigen>> 8/15: Legionella urinary antigen>> 8/15: Sputum >> 8/15: MRSA PCR>>  Antimicrobials:   Anti-infectives (From admission, onward)    Start     Dose/Rate Route Frequency Ordered Stop   05/01/24 1000  vancomycin  (VANCOREADY) IVPB 750 mg/150 mL        750 mg 150 mL/hr over 60 Minutes Intravenous Every 48 hours 04/27/24 1437     04/27/24 1400  azithromycin  (ZITHROMAX ) 500 mg in sodium chloride  0.9 % 250 mL IVPB        500 mg 250 mL/hr over 60 Minutes Intravenous Every 24 hours 04/27/24 1206 04/30/24 1359   04/27/24 1400  piperacillin -tazobactam (ZOSYN ) IVPB 3.375 g        3.375 g 12.5 mL/hr over 240 Minutes Intravenous Every 8 hours 04/27/24 1226     04/27/24 0900  ceFEPIme  (MAXIPIME ) 2 g in sodium chloride  0.9 % 100 mL IVPB        2 g 200 mL/hr over 30 Minutes Intravenous  Once 04/27/24 0845 04/27/24 0942   04/27/24 0900  metroNIDAZOLE  (FLAGYL ) IVPB 500 mg        500 mg 100 mL/hr over 60  Minutes Intravenous  Once 04/27/24 0845 04/27/24 1018   04/27/24 0900  vancomycin  (VANCOCIN ) IVPB 1000 mg/200 mL premix        1,000 mg 200 mL/hr over 60 Minutes Intravenous  Once 04/27/24 0845 04/27/24 1122        Significant Hospital Events: Including procedures, antibiotic start and stop dates in addition to other pertinent events   8/15: Presents to ED with AMS, found to be hypoxic and hypercapnic, placed on BiPAP.  Family confirms DNR/DNI status.  BP soft, currently not requiring vasopressors, PCCM consulted in case he were to need pressors.  Interim History / Subjective:  As outlined above under Significant Hospital Events section  Objective   Blood pressure (!) 93/57, pulse 99, temperature (!) 97 F (36.1 C), temperature source Rectal, resp. rate (!) 24, height 5' 6 (1.676 m), weight 43.9 kg, SpO2 91%.    FiO2 (%):  [40 %] 40 % PEEP:  [5 cmH20] 5 cmH20 Pressure Support:  [10 cmH20] 10 cmH20  No intake or output data in the 24 hours ending 04/27/24 1209 Filed Weights   04/27/24 0844  Weight: 43.9 kg    Examination: General: Acute on chronically ill appearing frail elderly male, laying in bed, on BiPAP, with mild respiratory distress  HENT: Atraumatic, normocephalic, neck supple, no JVD, dry MM Lungs: Coarse breath sounds throughout, BiPAP assisted, mild tachypnea, even Cardiovascular: RRR, no M/R/G Abdomen: Flat, soft, nondistended, nontender, no guarding or rebound tenderness Extremities: Cachetic, no deformities, no edema or cyanosis Neuro: Somnolent, withdraws and opens to painful stimuli, currently unable to follow commands, pupils PERRL GU: Deferred  Resolved Hospital Problem list     Assessment & Plan:   #Acute Hypoxic & Hypercapnic Respiratory Failure in the setting of ... #Pneumonia: HCAP vs Aspiration  #AECOPD -Supplemental O2 as needed to maintain O2 sats 88 to 92% -BiPAP, wean as tolerated (family confirms DNR/DNI) -Follow intermittent Chest X-ray &  ABG as needed -Bronchodilators  -IV Steroids -ABX as above -Diuresis as BP and renal function permits -Pulmonary toilet as able  #Met SIRS Criteria on presentation: Temperature 91.6 F, RR >20 #Severe Sepsis due to Pneumonia: HCAP vs Aspiration  -Monitor fever curve -Trend WBC's & Procalcitonin -Follow cultures as above -Continue empiric Azithromycin  & Zosyn  pending cultures & sensitivities  #Mildly elevated Troponin, suspect demand ischemia PMHx: HTN, A.fib on Eliquis  -Echocardiogram 06/17/22: LVEF 60-65%, grade I DD, RV size and RV systolic function both normal, normal pulmonary artery systolic pressure -Continuous cardiac monitoring -Maintain MAP >65 -IV fluids -Vasopressors as needed to maintain MAP goal -Trend lactic acid until normalized -Trend HS Troponin until peaked -Consider Echocardiogram  -Hold Eliquis  for now, has been ordered for treatment dose Lovenox  by primary service   #Mild Hypernatremia -Monitor I&O's / urinary output -Follow BMP -Ensure adequate renal perfusion -Avoid nephrotoxic agents as able -Replace electrolytes as indicated ~ Pharmacy following for assistance with electrolyte replacement  #Acute Metabolic Encephalopathy #CO2 Narcosis -CT Head negative for acute intracranial abnormality -UDS is negative -Treatment of metabolic derangements and hypercapnia as outlined above -Provide supportive care -Promote normal sleep/wake cycle and family presence -Avoid sedating medications as able       Pt is critically ill with multiorgan failure. Prognosis is guarded, high risk for further decompensation, cardiac arrest, and death.  Given current critical illness superimposed on multiple chronic co-morbidities and advanced age, overall long term prognosis is poor.  Pt is DNR/DNI status.  Depending on clinical course, may need to consider consulting Palliative Care to assist with GOC discussions.   Best Practice (right click and Reselect all SmartList  Selections daily)   Diet/type: NPO DVT prophylaxis: LMWH GI prophylaxis: N/A Lines: N/A Foley:  N/A Code Status:  DNR Last date of multidisciplinary goals of care discussion [N/A]   Labs   CBC: Recent Labs  Lab 04/27/24 0845  WBC 8.3  NEUTROABS 7.3  HGB 12.9*  HCT 40.6  MCV 100.0  PLT 157    Basic Metabolic Panel: Recent Labs  Lab 04/27/24 0845  NA 146*  K 4.6  CL 107  CO2 29  GLUCOSE 163*  BUN 30*  CREATININE 1.06  CALCIUM 8.6*  MG 2.3   GFR: Estimated Creatinine Clearance: 27.6 mL/min (by C-G formula based on SCr of 1.06 mg/dL). Recent Labs  Lab 04/27/24 0845 04/27/24 1045  WBC 8.3  --   LATICACIDVEN 4.8* 2.4*    Liver Function Tests: Recent Labs  Lab 04/27/24 0845  AST 75*  ALT 36  ALKPHOS 53  BILITOT 0.6  PROT 6.3*  ALBUMIN 3.1*   Recent Labs  Lab 04/27/24 0845  LIPASE 31   No results for input(s): AMMONIA in the last 168 hours.  ABG    Component Value Date/Time   HCO3 29.3 (H) 04/27/2024 0845  ACIDBASEDEF 3.6 (H) 04/27/2024 0845   O2SAT 41.6 04/27/2024 0845     Coagulation Profile: Recent Labs  Lab 04/27/24 0845  INR 1.4*    Cardiac Enzymes: No results for input(s): CKTOTAL, CKMB, CKMBINDEX, TROPONINI in the last 168 hours.  HbA1C: Hgb A1c MFr Bld  Date/Time Value Ref Range Status  10/03/2023 09:59 AM 5.5 4.8 - 5.6 % Final    Comment:             Prediabetes: 5.7 - 6.4          Diabetes: >6.4          Glycemic control for adults with diabetes: <7.0     CBG: No results for input(s): GLUCAP in the last 168 hours.  Review of Systems:   Unable to assess due to AMS   Past Medical History:  He,  has a past medical history of Adult BMI <19 kg/sq m (01/21/2020), Alzheimer disease (HCC), Atherosclerotic heart disease of native coronary artery without angina pectoris, Atrial fibrillation (HCC) (06/03/2022), B12 deficiency, Bilateral carotid bruits (01/28/2020), Chronic atrial fibrillation (HCC)  (01/21/2020), CKD (chronic kidney disease), stage II, CKD (chronic kidney disease), stage III (HCC), Contusion of left knee (09/28/2021), Coronary atherosclerosis, Essential hypertension (01/28/2020), Essential hypertension, benign, Essential tremor, Malnutrition of moderate degree (HCC) (01/21/2020), Memory deficit (09/08/2020), Mixed hearing loss, bilateral, Mixed hyperlipidemia, Neck pain (07/01/2021), Obstructive chronic bronchitis without exacerbation (HCC) (01/21/2020), Osteoarthritis, Personal history of malignant neoplasm of larynx (01/18/2020), Primary hypothyroidism (01/22/2020), and Unstable gait (01/21/2020).   Surgical History:   Past Surgical History:  Procedure Laterality Date   CATARACT EXTRACTION, BILATERAL       Social History:   reports that he quit smoking about 17 years ago. His smoking use included cigarettes. His smokeless tobacco use includes chew. He reports that he does not drink alcohol  and does not use drugs.   Family History:  His family history is negative for Hypertension, Heart disease, Cancer, and Diabetes.   Allergies Allergies  Allergen Reactions   Aricept [Donepezil] Other (See Comments)    Abdominal pain, diarrhea     Home Medications  Prior to Admission medications   Medication Sig Start Date End Date Taking? Authorizing Provider  apixaban  (ELIQUIS ) 2.5 MG TABS tablet Take 1 tablet (2.5 mg total) by mouth 2 (two) times daily. 01/16/24   Milon Cleaves, PA  aspirin  EC 81 MG tablet Take 81 mg by mouth daily. Swallow whole.    [provider]  bacitracin  ointment Apply 1 Application topically 2 (two) times daily. 10/18/23   Milon Cleaves, PA  carbidopa -levodopa  (SINEMET  IR) 25-100 MG tablet Take 1 tablet by mouth 3 (three) times daily. 03/26/24 03/26/25  [provider]  ipratropium-albuterol  (DUONEB) 0.5-2.5 (3) MG/3ML SOLN Take 3 mLs by nebulization every 6 (six) hours as needed. 04/04/24   [provider]  levothyroxine  (SYNTHROID )  75 MCG tablet Take 75 mcg by mouth daily before breakfast.    [provider]  melatonin 3 MG TABS tablet Take 6 mg by mouth at bedtime as needed. 04/10/24   [provider]  memantine  (NAMENDA ) 10 MG tablet TAKE 1 TABLET BY MOUTH TWICE A DAY 12/14/23   Milon Cleaves, PA  simvastatin  (ZOCOR ) 20 MG tablet TAKE 1 TABLET BY MOUTH EVERY DAY 03/13/24   Revankar, Jennifer SAUNDERS, MD  verapamil  (CALAN -SR) 120 MG CR tablet Take 120 mg by mouth daily. 04/05/24 05/10/25  [provider]     Critical care time: 50 minutes  Inge Lecher, AGACNP-BC [facility] Prefer epic messenger for cross cover needs If after hours, please call E-link

## 2024-04-27 NOTE — ED Notes (Signed)
 Charge Jon notified of the need for a sitter due to patient attempts to pull equipment and danger to self.

## 2024-04-27 NOTE — ED Triage Notes (Signed)
 Pt to ED via [facility] from home. Pt son woke up this morning and found pt unresponsive. EMS reports on their arrival pt was responsive to painful stimuli only and had pin point pupils. Pts initial SpO2 was 62% on room air. Pt was placed on a Non Rebreather with improvement in sats to 96%. EMS unable to obtain temp on pt. 1 mg of Narcan was given about 10 minutes PTA with little response. Pt arrives to the ED alert but altered. Pt was given approximately 350 mL of NS in route. Pt family denies fall or use of blood thinners.

## 2024-04-27 NOTE — Progress Notes (Signed)
 PHARMACIST - PHYSICIAN COMMUNICATION  CONCERNING:  Enoxaparin  (Lovenox ) for DVT Prophylaxis    RECOMMENDATION: Patient was prescribed enoxaprin 40mg  q24 hours for VTE prophylaxis.   Filed Weights   04/27/24 0844  Weight: 43.9 kg (96 lb 12.5 oz)    Body mass index is 15.62 kg/m.  Estimated Creatinine Clearance: 27.6 mL/min (by C-G formula based on SCr of 1.06 mg/dL).  Patient is candidate for enoxaparin  30mg  every 24 hours based on CrCl <34ml/min or Weight <45kg  DESCRIPTION: Pharmacy has adjusted enoxaparin  dose per [facility]: [facility] policy.  Patient is now receiving enoxaparin  30 mg every 24 hours    Olam KANDICE Fritter, PharmD Clinical Pharmacist  04/27/2024 2:25 PM

## 2024-04-27 NOTE — Progress Notes (Signed)
 Elink monitoring for the code sepsis protocol.

## 2024-04-27 NOTE — Progress Notes (Signed)
 PT TRANSPORTED ON SERVO-AIR BIPAP TO ICU FROM ER AND TAKEN OFF D/T ABG RESULTS ONCE IN ROOM AND PLACED ON 3L [place]

## 2024-04-27 NOTE — ED Notes (Signed)
 Bear hugger removed. Warm blankets provided.

## 2024-04-27 NOTE — H&P (Addendum)
 History and Physical    Lucas Kline FMW:[id number] DOB: 1929-03-01 DOA: 04/27/2024  DOS: the patient was seen and examined on 04/27/2024  PCP: Lucas Cleaves, PA   Patient coming from: Home  I have personally briefly reviewed patient's old medical records in [facility]  Chief Complaint: Altered mental status  HPI: Lucas Kline is a pleasant 89 y.o. male with medical history significant for A-fib, COPD, Alzheimer's dementia, HTN, HLD, hypothyroidism, Parkinson's disease who was recently admitted discharged from the hospital 3 weeks ago for weakness and pneumonia.  He was treated with vancomycin  and Zosyn  and was discharged.  He attended to [facility] emergency room on 04/22/2024 for generalized weakness.  He was discharged home after workup being done at the emergency room. Today he was found unresponsive at home.  When EMS arrived at home he was found to have a saturation in 60% in room air, pinpoint pupils and patient was placed on nonrebreather where he was given a dose of Narcan with some minimal response.  Patient was given some IV fluid on arrival.  Patient is altered responds to only painful stimuli on BiPAP, not able to provide any history. I spoke to patient's son Lucas Kline over the phone.  Lucas Kline said that patient went to bed last night early that is unusual to him.  This morning he found obtunded on his bed.  Then he called EMS and brought him to the hospital.  He also told me that he has been going back and forth to hospital in clinic for cough shortness of breath pneumonia and dehydration and not getting fully better.  ED Course: Upon arrival to the ED, patient is found to be hypoxemic, obtunded, septic.  He was given IV fluid, IV antibiotics, BiPAP, oxygen  and hospitalist service was consulted for evaluation for admission.  Critical care team was consulted for evaluation as well.  Critical care team advised that patient can be admitted to stepdown unit under hospitalist service  and they will follow-up if needed.  Patient was given Zosyn  and vancomycin  and cultures were sent from the emergency room.  Review of Systems:  ROS  All other systems negative except as noted in the HPI.  Past Medical History:  Diagnosis Date   Adult BMI <19 kg/sq m 01/21/2020   Alzheimer disease (HCC)    Atherosclerotic heart disease of native coronary artery without angina pectoris    CHRONIC    Atrial fibrillation (HCC) 06/03/2022   B12 deficiency    CHRONIC   Bilateral carotid bruits 01/28/2020   Chronic atrial fibrillation (HCC) 01/21/2020   CKD (chronic kidney disease), stage II    MILD   CKD (chronic kidney disease), stage III (HCC)    MODERATE (CHRONIC)   Contusion of left knee 09/28/2021   Coronary atherosclerosis    CHRONIC   Essential hypertension 01/28/2020   Essential hypertension, benign    Essential tremor    CHRONIC   Malnutrition of moderate degree (HCC) 01/21/2020   Memory deficit 09/08/2020   Mixed hearing loss, bilateral    CHRONIC   Mixed hyperlipidemia    CHRONIC    Neck pain 07/01/2021   Obstructive chronic bronchitis without exacerbation (HCC) 01/21/2020   Osteoarthritis    OF KNEE CHRONIC   Personal history of malignant neoplasm of larynx 01/18/2020   Primary hypothyroidism 01/22/2020   Unstable gait 01/21/2020    Past Surgical History:  Procedure Laterality Date   CATARACT EXTRACTION, BILATERAL       reports that  he quit smoking about 17 years ago. His smoking use included cigarettes. His smokeless tobacco use includes chew. He reports that he does not drink alcohol  and does not use drugs.  Allergies  Allergen Reactions   Aricept [Donepezil] Other (See Comments)    Abdominal pain, diarrhea    Family History  Problem Relation Age of Onset   Hypertension Neg Hx    Heart disease Neg Hx    Cancer Neg Hx    Diabetes Neg Hx     Prior to Admission medications   Medication Sig Start Date End Date Taking? Authorizing Provider   apixaban  (ELIQUIS ) 2.5 MG TABS tablet Take 1 tablet (2.5 mg total) by mouth 2 (two) times daily. 01/16/24  Yes Lucas, [place], PA  aspirin  EC 81 MG tablet Take 81 mg by mouth daily. Swallow whole.   Yes [provider]  carbidopa -levodopa  (SINEMET  IR) 25-100 MG tablet Take 1 tablet by mouth 3 (three) times daily. 03/26/24 03/26/25 Yes [provider]  ipratropium-albuterol  (DUONEB) 0.5-2.5 (3) MG/3ML SOLN Take 3 mLs by nebulization every 6 (six) hours as needed. 04/04/24  Yes [provider]  levothyroxine  (SYNTHROID ) 75 MCG tablet Take 75 mcg by mouth daily before breakfast.   Yes [provider]  melatonin 3 MG TABS tablet Take 6 mg by mouth at bedtime as needed. 04/10/24  Yes [provider]  memantine  (NAMENDA ) 10 MG tablet TAKE 1 TABLET BY MOUTH TWICE A DAY 12/14/23  Yes Lucas, [place], PA  simvastatin  (ZOCOR ) 20 MG tablet TAKE 1 TABLET BY MOUTH EVERY DAY 03/13/24  Yes Kline, Lucas SAUNDERS, MD  verapamil  (CALAN -SR) 120 MG CR tablet Take 120 mg by mouth daily. 04/05/24 05/10/25 Yes [provider]  bacitracin  ointment Apply 1 Application topically 2 (two) times daily. Patient not taking: Reported on 04/27/2024 10/18/23   Lucas Lucas Kline, Kline    Physical Exam: Vitals:   04/27/24 1400 04/27/24 1440 04/27/24 1500 04/27/24 1515  BP: 90/68 97/72 108/62 135/72  Pulse: 98 96 91 (!) 109  Resp: 17 (!) 24 (!) 23 (!) 24  Temp:    97.6 F (36.4 C)  TempSrc:    Axillary  SpO2: 98% 96% 95% (!) 83%  Weight:    47.1 kg  Height:    5' 6 (1.676 m)    Physical Exam   Constitutional: Drowsy responds only with painful stimuli HEENT: Neck supple Respiratory: Clear to auscultation B/L, no wheezing, no rales.  Cardiovascular: Regular rate and rhythm, no murmurs / rubs / gallops. No extremity edema. 2+ pedal pulses. No carotid bruits.  Abdomen: Soft, no tenderness, Bowel sounds positive.  Musculoskeletal: no clubbing / cyanosis. Good ROM, no contractures. Normal muscle  tone.  Skin: no rashes, lesions, ulcers. Neurology: Drowsy on BiPAP, not able to evaluate thoroughly. Psychiatric: Drowsy not able to evaluate Labs on Admission: I have personally reviewed following labs and imaging studies  CBC: Recent Labs  Lab 04/27/24 0845  WBC 8.3  NEUTROABS 7.3  HGB 12.9*  HCT 40.6  MCV 100.0  PLT 157   Basic Metabolic Panel: Recent Labs  Lab 04/27/24 0845  NA 146*  K 4.6  CL 107  CO2 29  GLUCOSE 163*  BUN 30*  CREATININE 1.06  CALCIUM 8.6*  MG 2.3   GFR: Estimated Creatinine Clearance: 29.6 mL/min (by C-G formula based on SCr of 1.06 mg/dL). Liver Function Tests: Recent Labs  Lab 04/27/24 0845  AST 75*  ALT 36  ALKPHOS 53  BILITOT 0.6  PROT  6.3*  ALBUMIN 3.1*   Recent Labs  Lab 04/27/24 0845  LIPASE 31   No results for input(s): AMMONIA in the last 168 hours. Coagulation Profile: Recent Labs  Lab 04/27/24 0845  INR 1.4*   Cardiac Enzymes: Recent Labs  Lab 04/27/24 0845 04/27/24 1140  TROPONINIHS 23* 29*   BNP (last 3 results) Recent Labs    04/27/24 0845  BNP 285.9*   HbA1C: No results for input(s): HGBA1C in the last 72 hours. CBG: No results for input(s): GLUCAP in the last 168 hours. Lipid Profile: No results for input(s): CHOL, HDL, LDLCALC, TRIG, CHOLHDL, LDLDIRECT in the last 72 hours. Thyroid  Function Tests: No results for input(s): TSH, T4TOTAL, FREET4, T3FREE, THYROIDAB in the last 72 hours. Anemia Panel: No results for input(s): VITAMINB12, FOLATE, FERRITIN, TIBC, IRON, RETICCTPCT in the last 72 hours. Urine analysis:    Component Value Date/Time   COLORURINE YELLOW (A) 04/27/2024 1400   APPEARANCEUR HAZY (A) 04/27/2024 1400   LABSPEC >1.046 (H) 04/27/2024 1400   PHURINE 5.0 04/27/2024 1400   GLUCOSEU NEGATIVE 04/27/2024 1400   HGBUR NEGATIVE 04/27/2024 1400   BILIRUBINUR NEGATIVE 04/27/2024 1400   BILIRUBINUR small (A) 02/21/2024 1008   KETONESUR  NEGATIVE 04/27/2024 1400   PROTEINUR NEGATIVE 04/27/2024 1400   UROBILINOGEN 0.2 02/21/2024 1008   NITRITE NEGATIVE 04/27/2024 1400   LEUKOCYTESUR NEGATIVE 04/27/2024 1400    Radiological Exams on Admission: I have personally reviewed images CT ABDOMEN PELVIS W CONTRAST Result Date: 04/27/2024 CLINICAL DATA:  89 year old male found unresponsive this morning. EXAM: CT ABDOMEN AND PELVIS WITH CONTRAST TECHNIQUE: Multidetector CT imaging of the abdomen and pelvis was performed using the standard protocol following bolus administration of intravenous contrast. RADIATION DOSE REDUCTION: This exam was performed according to the departmental dose-optimization program which includes automated exposure control, adjustment of the mA and/or kV according to patient size and/or use of iterative reconstruction technique. CONTRAST:  75mL OMNIPAQUE  IOHEXOL  350 MG/ML SOLN COMPARISON:  Abnormal Chest CTA today reported separately. Noncontrast CT Abdomen and Pelvis 02/04/2010. FINDINGS: Lower chest: CTA chest reported separately today. Hepatobiliary: Liver and gallbladder appear within normal limits when allowing for mild motion artifact. No perihepatic fluid. Pancreas: Negative. Spleen: Negative. Adrenals/Urinary Tract: Normal adrenal glands. Symmetric bilateral renal cortical volume loss. No hydronephrosis. Symmetric renal enhancement and contrast excretion. Nondilated ureters and urinary bladder. Occasional pelvic phleboliths. Stomach/Bowel: Redundant but decompressed distal large bowel in the pelvis. Sigmoid diverticulosis. No active inflammation identified. Decompressed descending and distal transverse colon. Mild upstream proximal transverse and right colon retained stool. Paucity of visceral fat. No large bowel inflammation identified. Nondilated small bowel. Decompressed stomach. No pneumoperitoneum. No free fluid identified. Vascular/Lymphatic: Aortoiliac calcified atherosclerosis. Tortuous abdominal aorta. Major  arterial structures in the abdomen and pelvis remain patent. Portal venous system appears to be patent. No lymphadenopathy. Reproductive: Negative. Other: No pelvis free fluid. Musculoskeletal: No acute osseous abnormality identified. Mild for age lumbar spine degeneration. IMPRESSION: 1. No acute or inflammatory process identified in mildly motion degraded abdomen and pelvis. See CTA Chest reported separately. 2.  Aortic Atherosclerosis (ICD10-I70.0). Electronically Signed   By: VEAR Hurst M.D.   On: 04/27/2024 10:38   CT Cervical Spine Wo Contrast Result Date: 04/27/2024 CLINICAL DATA:  89 year old male found unresponsive this morning. EXAM: CT CERVICAL SPINE WITHOUT CONTRAST TECHNIQUE: Multidetector CT imaging of the cervical spine was performed without intravenous contrast. Multiplanar CT image reconstructions were also generated. RADIATION DOSE REDUCTION: This exam was performed according to the departmental dose-optimization program which  includes automated exposure control, adjustment of the mA and/or kV according to patient size and/or use of iterative reconstruction technique. COMPARISON:  Head and chest CT today reported separately. FINDINGS: Alignment: Straightening of cervical lordosis. Mild cervical scoliosis. Maintained cervicothoracic junction posterior element alignment. Skull base and vertebrae: Bone mineralization is within normal limits for age. Visualized skull base is intact. No atlanto-occipital dissociation. C1 and C2 appear intact and aligned. No acute osseous abnormality identified. Soft tissues and spinal canal: No prevertebral fluid or swelling. No visible canal hematoma. Negative visible noncontrast neck soft tissues. Disc levels: Generally mild for age cervical spine degeneration. There is degenerative ankylosis of the C5-C6 levels. No significant cervical spinal stenosis by CT. Upper chest: Mild and chronic appearing T2 superior endplate compression. Chest CTA today reported separately.  IMPRESSION: 1. No acute traumatic injury identified in the cervical spine. 2. Mild for age cervical spine degeneration superimposed on degenerative C5-C6 ankylosis. Electronically Signed   By: VEAR Hurst M.D.   On: 04/27/2024 10:34   CT HEAD WO CONTRAST ( ) Result Date: 04/27/2024 CLINICAL DATA:  89 year old male found unresponsive this morning. EXAM: CT HEAD WITHOUT CONTRAST TECHNIQUE: Contiguous axial images were obtained from the base of the skull through the vertex without intravenous contrast. RADIATION DOSE REDUCTION: This exam was performed according to the departmental dose-optimization program which includes automated exposure control, adjustment of the mA and/or kV according to patient size and/or use of iterative reconstruction technique. FINDINGS: Brain: Cerebral volume may be normal for age. No midline shift, ventriculomegaly, mass effect, evidence of mass lesion, intracranial hemorrhage or evidence of cortically based acute infarction. Patchy mild to moderate for age periventricular and scattered white matter hypodensity. Mild to moderate heterogeneity in the bilateral thalami. No cortical encephalomalacia identified. Vascular: Calcified atherosclerosis at the skull base. No suspicious intracranial vascular hyperdensity. Skull: Intact.  No acute osseous abnormality identified. Sinuses/Orbits: Visualized paranasal sinuses and mastoids are well aerated. Right sphenoid sinus periosteal thickening. Other: No acute orbit or scalp soft tissue finding. IMPRESSION: 1. No acute intracranial abnormality identified. 2. Mild to moderate heterogeneity in the thalami and cerebral white matter most commonly due to small vessel disease. Electronically Signed   By: VEAR Hurst M.D.   On: 04/27/2024 10:32   CT Angio Chest PE W and/or Wo Contrast Result Date: 04/27/2024 CLINICAL DATA:  89 year old male found unresponsive this morning. EXAM: CT ANGIOGRAPHY CHEST WITH CONTRAST TECHNIQUE: Multidetector CT imaging of the  chest was performed using the standard protocol during bolus administration of intravenous contrast. Multiplanar CT image reconstructions and MIPs were obtained to evaluate the vascular anatomy. RADIATION DOSE REDUCTION: This exam was performed according to the departmental dose-optimization program which includes automated exposure control, adjustment of the mA and/or kV according to patient size and/or use of iterative reconstruction technique. CONTRAST:  75mL OMNIPAQUE  IOHEXOL  350 MG/ML SOLN COMPARISON:  Portable chest 0901 hours today. CT Abdomen and Pelvis today reported separately. FINDINGS: Cardiovascular: Excellent contrast bolus timing in the pulmonary arterial tree. No saddle or lobar pulmonary embolus. Left lower lobe segmental branch nonocclusive thrombus suggested on series 7, image 255, long segment and continuing distally. No other pulmonary artery filling defect is identified. Normal heart size. No pericardial effusion. Calcified aortic atherosclerosis. Coronary artery atherosclerosis. Mediastinum/Nodes: Negative for mediastinal mass or lymphadenopathy. Reactive appearing small bilateral hilar lymph nodes. Lungs/Pleura: Major airways are patent. Centrilobular emphysema, severe in the left lower lobe with bullous disease there and architectural distortion. Widespread Patchy and confluent right lung opacity is both peribronchial  and peripheral. Areas of early consolidation in all 3 lobes. Trace right pleural effusion. Frothy retained secretions at the carina and subtotal opacification of the bronchus intermedius and right lower lobe airways. More preserved patency of the left side airways. Upper Abdomen: CT Abdomen and Pelvis reported separately. Musculoskeletal: Maintained vertebral height. No acute or suspicious osseous lesion identified. Review of the MIP images confirms the above findings. IMPRESSION: 1. Emphysema (ICD10-J43.9), with severe bullous disease and architectural distortion in the left  lower lobe. Isolated possible small nonocclusive PE in that lobe, significance of which is doubtful. And no other pulmonary artery filling defect. 2. Positive for Right lung Multilobar Pneumonia with retained secretions and opacification of right lung airways, suspicious for Aspiration. Only trace right pleural effusion. 3.  Aortic Atherosclerosis (ICD10-I70.0) 4.  CT Abdomen and Pelvis reported separately. Electronically Signed   By: VEAR Hurst M.D.   On: 04/27/2024 10:29   DG Chest Port 1 View Result Date: 04/27/2024 CLINICAL DATA:  Concern for sepsis.  Patient found unresponsive. EXAM: PORTABLE CHEST 1 VIEW COMPARISON:  12/27/2021 FINDINGS: The heart size and mediastinal contours are within normal limits. Multifocal airspace opacities throughout the right lung, most pronounced in the right upper lung zone. Severe chronic emphysematous changes. No pleural effusion or pneumothorax. No acute osseous abnormality identified. IMPRESSION: Multifocal airspace disease throughout the right lung, most pronounced in the right upper lung zone, may be secondary to aspiration or multifocal pneumonia. Electronically Signed   By: Harrietta Sherry M.D.   On: 04/27/2024 09:12    EKG: My personal interpretation of EKG shows: Sinus rhythm and PVCs no ST elevation    Assessment/Plan Principal Problem:   Sepsis (HCC) Active Problems:   Mixed hyperlipidemia   Alzheimer disease (HCC)   Chronic atrial fibrillation (HCC)   Primary hypothyroidism   Essential hypertension   COPD exacerbation (HCC)   CAP (community acquired pneumonia)    Assessment and Plan: 89 year old male W/PMH of advanced dementia, recent history of pneumonia, dehydration, atrial fibrillation on Eliquis , HTN, COPD, HLD who was brought in to the ED for acute change in mental status.  1.  Sepsis secondary to pneumonia present on admission - Patient was hypoxemic, tachycardic tachypneic, lactic acidosis with radiology evidence of right lower lobe  pneumonia - Patient was given Zosyn  and vancomycin  in the emergency room along with IV fluid. - Patient has been given BiPAP for acute hypoxemic respiratory failure due to sepsis and pneumonia - Continue oxygen  to maintain saturation more than 90% - Continue Zosyn  and vancomycin   2.  Acute hypoxemic respiratory failure due to pneumonia and sepsis - Continue BiPAP, patient is DNR/DNI - Continue oxygen  to maintain saturation more than 90% - Continue antibiotics as mentioned above  3.  Pneumonia aspiration versus community-acquired - Patient has been having pneumonia for the last few weeks. - Patient was recently treated for pneumonia 2 to 3 weeks ago - It appears that he has recurrent episodes of pneumonia, dehydration may be related to Alzheimer's dementia aspiration and not able to eat. - Continue Zosyn  and vancomycin  - Follow-up cultures  4.  COPD with exacerbation - Continue oxygen  BiPAP, antibiotics and steroid - Continue nebulization once patient able to get it.  5.  Atrial fibrillation on Eliquis  - Rate has been well-controlled - Continue current regimen - Continue Eliquis   6.  Possible aspiration - Will get speech pathologist once patient is able to follow commands. - It will be unsafe at this point to feed him.   Addendum:  I did speak with the patient's son and daughter at bedside at 4:45 PM again.  They advised me to have palliative care consult and would like to discuss with them.  Palliative care consult has been requested.  DVT prophylaxis: Eliquis  Code Status: DNR/DNI(Do NOT Intubate) Family Communication: I did speak with patient's son Lucas Kline who will talk to the family.  I have discussed with them that his prognosis is poor and it would be best if family can discuss about possibly hospice or comfort care.  Lucas Kline will talk to his sister and get back to us .  Patient is DNR/DNI at this point. Disposition Plan: Home versus hospice Consults called: Critical  care Admission status: Inpatient, Step Down Unit   Nena Rebel, MD [facility] 04/27/2024, 3:49 PM

## 2024-04-27 NOTE — ED Notes (Signed)
 Pt placed on bipap by respiratory at this time. Face mask noted to be too big, physician made aware of same by respiratory therapist. The decision was made to keep pt on bipap to see if it improves his status.

## 2024-04-27 NOTE — Plan of Care (Signed)
 ?  Problem: Elimination: ?Goal: Will not experience complications related to urinary retention ?Outcome: Progressing ?  ?

## 2024-04-27 NOTE — ED Notes (Signed)
 IV access attempted x 2 by Harlene, RN

## 2024-04-27 NOTE — ED Provider Notes (Signed)
 [facility] Provider Note    Event Date/Time   First MD Initiated Contact with Patient 04/27/24 [phone]     (approximate)   History   Altered Mental Status   HPI  Lucas Kline is a 89 y.o. male with history of A-fib, COPD, altered mental disease who comes in with concerns for altered mental status.  I reviewed a note from 04/22/2024 where patient was seen at the emergency room with baseline at [facility] and cleared for discharge home for weakness.  Patient was found today unresponsive.  When EMS got there they found his sats were 62% with some pinpoint pupils patient was placed on nonrebreather where he gave a milligram of Narcan with some minimal response.  Patient given some fluids on arrival.  Patient unable to give HPI secondary to altered mental status.  According to family they reported that he has not been doing the thickened liquids as had been recommended.  Physical Exam   Triage Vital Signs: ED Triage Vitals [04/27/24 0833]  Encounter Vitals Group     BP      Girls Systolic BP Percentile      Girls Diastolic BP Percentile      Boys Systolic BP Percentile      Boys Diastolic BP Percentile      Pulse      Resp      Temp      Temp src      SpO2 96 %     Weight      Height      Head Circumference      Peak Flow      Pain Score      Pain Loc      Pain Education      Exclude from Growth Chart     Most recent vital signs: Vitals:   04/27/24 0833  SpO2: 96%     General: Patient is withdrawing to pain, moaning, spontaneously moving his arms CV:  Good peripheral perfusion.  Resp:  Normal effort.  Abd:  No distention.  Soft and nontender Other:     ED Results / Procedures / Treatments   Labs (all labs ordered are listed, but only abnormal results are displayed) Labs Reviewed  CBC WITH DIFFERENTIAL/PLATELET - Abnormal; Notable for the following components:      Result Value   RBC 4.06 (*)    Hemoglobin 12.9 (*)    Lymphs Abs  0.3 (*)    All other components within normal limits  RESP PANEL BY RT-PCR (RSV, FLU A&B, COVID)  RVPGX2  CULTURE, BLOOD (ROUTINE X 2)  CULTURE, BLOOD (ROUTINE X 2)  LACTIC ACID, PLASMA  LACTIC ACID, PLASMA  COMPREHENSIVE METABOLIC PANEL WITH GFR  PROTIME-INR  BRAIN NATRIURETIC PEPTIDE  BLOOD GAS, VENOUS  LIPASE, BLOOD  URINALYSIS, W/ REFLEX TO CULTURE (INFECTION SUSPECTED)  MAGNESIUM  TROPONIN I (HIGH SENSITIVITY)     EKG  My interpretation of EKG:  Sinus rate of 90 without any ST elevation, no T wave inversions, PVC, right bundle branch block with left posterior fascicular block  RADIOLOGY I have reviewed the xray personally and interpreted with concerns for focal pneumonia on the right.  PROCEDURES:  Critical Care performed: Yes, see critical care procedure note(s)  .1-3 Lead EKG Interpretation  Performed by: Ernest Ronal BRAVO, MD Authorized by: Ernest Ronal BRAVO, MD     Interpretation: normal     ECG rate:  88   ECG rate assessment: normal  Rhythm: sinus rhythm     Ectopy: none     Conduction: normal      MEDICATIONS ORDERED IN ED: Medications  ceFEPIme  (MAXIPIME ) 2 g in sodium chloride  0.9 % 100 mL IVPB (2 g Intravenous New Bag/Given 04/27/24 0907)  metroNIDAZOLE  (FLAGYL ) IVPB 500 mg (500 mg Intravenous New Bag/Given 04/27/24 0909)  vancomycin  (VANCOCIN ) IVPB 1000 mg/200 mL premix (has no administration in time range)  sodium chloride  0.9 % bolus 1,000 mL (1,000 mLs Intravenous New Bag/Given 04/27/24 0905)     IMPRESSION / MDM / ASSESSMENT AND PLAN / ED COURSE  I reviewed the triage vital signs and the nursing notes.   Patient's presentation is most consistent with acute presentation with potential threat to life or bodily function.   Patient comes in with concerns for altered mental status known to be significantly hypothermic.  Patient's blood pressures are reassuring.  Patient will be started on a Bair hugger and give warm fluids.  Start broad-spectrum  antibiotics to cover for possible infection.  X-ray concerning for some right sided pneumonia.  I will get blood work to evaluate for [employer] abnormalities, AKI.  Due to the patient is on Eliquis  so we will need CT imaging to ensure no intracranial hemorrhage.  Given patient's altered we will get CT of abdomen pelvis to ensure no infections of this area as well.   9:35 AM discussed with the daughter who is the POA and the son who is in the room.  Patient is DNR.  They also do not want intubation.  We discussed the hypercapnia and how the intubation could be temporary but patient would not have wanted to be intubated even temporarily.  They do report he has had aspiration pneumonia previously and got better with just antibiotics and so they are requesting that we trial antibiotics, fluids and are okay with escalating to pressors if needed.  However they do not want to be intubated under any circumstance.  We discussed trialing BiPAP due to hypercapnia and the risk for aspiration given his mental status but given there are adamant that they would not want intubation they are requesting to trial BiPAP even knowing the risk for aspiration to see if it helps improve his symptoms.  They understand that patient is high risk for mortality.  10:42 AM patient was placed on BiPAP.  Discussed with respiratory therapy and they stated that the BiPAP was not sitting well on patient's face and that the alarm was beeping off due to air leak.  Discussed again with the POA daughter and she is adamant that she does not want to have intubation.  He is least oxygenating well with the BiPAP so we discussed trialing the BiPAP to see if his gas is improving and if is not then we will probably need to take him off of it due to the increased risk for worsening possible aspiration.  She did want to continue the BiPAP for now and repeat gas later to see if there was any improvement.  In the meantime we are continue to monitor to see if  patient needs pressors.  Will get a repeat lactate, blood pressures seem to be having maps generally over 65 but patient's mentation still very poor.  VBG shows significantly improved CO2 level   Discussed the case with the hospitalist who are made discussed the ICU team.  Discussed with Dr. DELENA from ICU.  He was comfortable with patient being admitted to stepdown and they would consult on.  The  patient is on the cardiac monitor to evaluate for evidence of arrhythmia and/or significant heart rate changes.      FINAL CLINICAL IMPRESSION(S) / ED DIAGNOSES   Final diagnoses:  Septic shock (HCC)  Aspiration pneumonia of right lung due to gastric secretions, unspecified part of lung (HCC)     Rx / DC Orders   ED Discharge Orders     None        Note:  This document was prepared using Dragon voice recognition software and may include unintentional dictation errors.   Ernest Ronal BRAVO, MD 04/27/24 1256

## 2024-04-27 NOTE — ED Notes (Signed)
 Patient awake at this time, removing blankets and moving all four extremities. Patient reorientation attempts and patient remaining to pull at blankets and equipment.

## 2024-04-27 NOTE — Consult Note (Signed)
 CODE SEPSIS - PHARMACY COMMUNICATION  **Broad Spectrum Antibiotics should be administered within 1 hour of Sepsis diagnosis**  Time Code Sepsis Called/Page Received: 9154  Antibiotics Ordered: cefepime , vancomycin  and Flagyl   Time of 1st antibiotic administration: 0907  Additional action taken by pharmacy: N/A   Kayla JULIANNA Blew ,PharmD Clinical Pharmacist  04/27/2024  9:27 AM

## 2024-04-27 NOTE — ED Notes (Signed)
 Pt's family at bedside at this time. Dr. Ernest spoke with same. Family reports they do not want CPR or intubation for pt. Family does, however, want other life saving measures such as blood pressure control and antibiotics.

## 2024-04-28 DIAGNOSIS — A419 Sepsis, unspecified organism: Secondary | ICD-10-CM | POA: Diagnosis not present

## 2024-04-28 DIAGNOSIS — J690 Pneumonitis due to inhalation of food and vomit: Secondary | ICD-10-CM

## 2024-04-28 DIAGNOSIS — G309 Alzheimer's disease, unspecified: Secondary | ICD-10-CM

## 2024-04-28 DIAGNOSIS — I482 Chronic atrial fibrillation, unspecified: Secondary | ICD-10-CM

## 2024-04-28 DIAGNOSIS — F0280 Dementia in other diseases classified elsewhere without behavioral disturbance: Secondary | ICD-10-CM

## 2024-04-28 DIAGNOSIS — Z515 Encounter for palliative care: Secondary | ICD-10-CM

## 2024-04-28 DIAGNOSIS — Z789 Other specified health status: Secondary | ICD-10-CM | POA: Diagnosis not present

## 2024-04-28 DIAGNOSIS — J441 Chronic obstructive pulmonary disease with (acute) exacerbation: Secondary | ICD-10-CM

## 2024-04-28 DIAGNOSIS — Z7189 Other specified counseling: Secondary | ICD-10-CM

## 2024-04-28 DIAGNOSIS — R6521 Severe sepsis with septic shock: Secondary | ICD-10-CM

## 2024-04-28 DIAGNOSIS — I10 Essential (primary) hypertension: Secondary | ICD-10-CM

## 2024-04-28 DIAGNOSIS — Z66 Do not resuscitate: Secondary | ICD-10-CM

## 2024-04-28 DIAGNOSIS — E039 Hypothyroidism, unspecified: Secondary | ICD-10-CM

## 2024-04-28 DIAGNOSIS — E782 Mixed hyperlipidemia: Secondary | ICD-10-CM

## 2024-04-28 LAB — CBC
HCT: 34.4 % — ABNORMAL LOW (ref 39.0–52.0)
Hemoglobin: 11.5 g/dL — ABNORMAL LOW (ref 13.0–17.0)
MCH: 32.5 pg (ref 26.0–34.0)
MCHC: 33.4 g/dL (ref 30.0–36.0)
MCV: 97.2 fL (ref 80.0–100.0)
Platelets: 108 K/uL — ABNORMAL LOW (ref 150–400)
RBC: 3.54 MIL/uL — ABNORMAL LOW (ref 4.22–5.81)
RDW: 14.6 % (ref 11.5–15.5)
WBC: 4.3 K/uL (ref 4.0–10.5)
nRBC: 0 % (ref 0.0–0.2)

## 2024-04-28 LAB — COMPREHENSIVE METABOLIC PANEL WITH GFR
ALT: 47 U/L — ABNORMAL HIGH (ref 0–44)
AST: 51 U/L — ABNORMAL HIGH (ref 15–41)
Albumin: 2.6 g/dL — ABNORMAL LOW (ref 3.5–5.0)
Alkaline Phosphatase: 38 U/L (ref 38–126)
Anion gap: 8 (ref 5–15)
BUN: 28 mg/dL — ABNORMAL HIGH (ref 8–23)
CO2: 25 mmol/L (ref 22–32)
Calcium: 8 mg/dL — ABNORMAL LOW (ref 8.9–10.3)
Chloride: 109 mmol/L (ref 98–111)
Creatinine, Ser: 1.1 mg/dL (ref 0.61–1.24)
GFR, Estimated: >60 mL/min (ref >60)
Glucose, Bld: 106 mg/dL — ABNORMAL HIGH (ref 70–99)
Potassium: 4.1 mmol/L (ref 3.5–5.1)
Sodium: 142 mmol/L (ref 135–145)
Total Bilirubin: 0.8 mg/dL (ref 0.0–1.2)
Total Protein: 5.4 g/dL — ABNORMAL LOW (ref 6.5–8.1)

## 2024-04-28 LAB — LACTIC ACID, PLASMA: Lactic Acid, Venous: 2 mmol/L (ref 0.5–1.9)

## 2024-04-28 LAB — GLUCOSE, CAPILLARY
Glucose-Capillary: 91 mg/dL (ref 70–99)
Glucose-Capillary: 99 mg/dL (ref 70–99)
Glucose-Capillary: 99 mg/dL (ref 70–99)
Glucose-Capillary: 90 mg/dL (ref 70–99)

## 2024-04-28 LAB — MAGNESIUM: Magnesium: 2 mg/dL (ref 1.7–2.4)

## 2024-04-28 LAB — PHOSPHORUS: Phosphorus: 3.1 mg/dL (ref 2.5–4.6)

## 2024-04-28 LAB — PROTIME-INR
INR: 1.7 — ABNORMAL HIGH (ref 0.8–1.2)
Prothrombin Time: 21.3 s — ABNORMAL HIGH (ref 11.4–15.2)

## 2024-04-28 MED ORDER — BIOTENE DRY MOUTH MT LIQD
15.0000 mL | OROMUCOSAL | Status: DC | PRN
Start: 2024-04-28 — End: 2024-04-29

## 2024-04-28 MED ORDER — HALOPERIDOL LACTATE 5 MG/ML IJ SOLN
INTRAMUSCULAR | Status: AC
Start: 2024-04-28 — End: 2024-04-28
  Filled 2024-04-28: qty 1

## 2024-04-28 MED ORDER — ASPIRIN 81 MG PO TBEC: 81.0000 mg | DELAYED_RELEASE_TABLET | Freq: Every day | ORAL | Status: DC

## 2024-04-28 MED ORDER — CARBIDOPA-LEVODOPA 25-100 MG PO TABS
1.0000 | ORAL_TABLET | Freq: Three times a day (TID) | ORAL | Status: DC
  Filled 2024-04-28 (×3): qty 1

## 2024-04-28 MED ORDER — IPRATROPIUM-ALBUTEROL 0.5-2.5 (3) MG/3ML IN SOLN: 3.0000 mL | RESPIRATORY_TRACT | Status: DC | PRN

## 2024-04-28 MED ORDER — MIDODRINE HCL 5 MG PO TABS: 10.0000 mg | ORAL_TABLET | Freq: Three times a day (TID) | ORAL | Status: DC

## 2024-04-28 MED ORDER — HALOPERIDOL LACTATE 2 MG/ML PO CONC: 0.5000 mg | ORAL | Status: DC | PRN

## 2024-04-28 MED ORDER — MORPHINE 100MG IN NS 100ML (1MG/ML) PREMIX INFUSION
1.0000 mg/h | INTRAVENOUS | Status: DC
  Administered 2024-04-28: 1 mg/h via INTRAVENOUS
  Filled 2024-04-28: qty 100

## 2024-04-28 MED ORDER — MEMANTINE HCL 10 MG PO TABS
10.0000 mg | ORAL_TABLET | Freq: Two times a day (BID) | ORAL | Status: DC
  Filled 2024-04-28 (×2): qty 1

## 2024-04-28 MED ORDER — ENOXAPARIN SODIUM 60 MG/0.6ML IJ SOSY
45.0000 mg | PREFILLED_SYRINGE | INTRAMUSCULAR | Status: DC
  Filled 2024-04-28: qty 0.6

## 2024-04-28 MED ORDER — HYDROCOD POLI-CHLORPHE POLI ER 10-8 MG/5ML PO SUER: 5.0000 mL | Freq: Two times a day (BID) | ORAL | Status: DC | PRN

## 2024-04-28 MED ORDER — MORPHINE SULFATE (PF) 2 MG/ML IV SOLN
1.0000 mg | INTRAVENOUS | Status: DC | PRN
  Administered 2024-04-28: 1 mg via INTRAVENOUS
  Administered 2024-04-28 (×2): 2 mg via INTRAVENOUS
  Administered 2024-04-28: 1 mg via INTRAVENOUS
  Filled 2024-04-28 (×4): qty 1

## 2024-04-28 MED ORDER — GUAIFENESIN ER 600 MG PO TB12: 600.0000 mg | ORAL_TABLET | Freq: Two times a day (BID) | ORAL | Status: DC

## 2024-04-28 MED ORDER — DEXMEDETOMIDINE HCL IN NACL 400 MCG/100ML IV SOLN
0.0000 ug/kg/h | INTRAVENOUS | Status: DC
  Administered 2024-04-28: 0.2 ug/kg/h via INTRAVENOUS
  Administered 2024-04-28: 0.3 ug/kg/h via INTRAVENOUS
  Filled 2024-04-28 (×2): qty 100

## 2024-04-28 MED ORDER — GLYCOPYRROLATE 1 MG PO TABS
1.0000 mg | ORAL_TABLET | ORAL | Status: DC | PRN
Start: 2024-04-28 — End: 2024-04-29

## 2024-04-28 MED ORDER — HALOPERIDOL LACTATE 5 MG/ML IJ SOLN
0.5000 mg | INTRAMUSCULAR | Status: DC | PRN
  Administered 2024-04-28: 0.5 mg via INTRAVENOUS
  Filled 2024-04-28: qty 1

## 2024-04-28 MED ORDER — IPRATROPIUM-ALBUTEROL 0.5-2.5 (3) MG/3ML IN SOLN
3.0000 mL | RESPIRATORY_TRACT | Status: DC
  Administered 2024-04-28: 3 mL via RESPIRATORY_TRACT
  Filled 2024-04-28: qty 3

## 2024-04-28 MED ORDER — SODIUM CHLORIDE 0.9 % IV BOLUS
250.0000 mL | Freq: Once | INTRAVENOUS | Status: AC
  Administered 2024-04-28: 250 mL via INTRAVENOUS

## 2024-04-28 MED ORDER — LEVOTHYROXINE SODIUM 50 MCG PO TABS: 75.0000 ug | ORAL_TABLET | Freq: Every day | ORAL | Status: DC

## 2024-04-28 MED ORDER — SODIUM CHLORIDE 0.9 % IV SOLN: INTRAVENOUS | Status: DC

## 2024-04-28 MED ORDER — HYOSCYAMINE SULFATE 0.5 MG/ML IJ SOLN: 0.2500 mg | Freq: Once | INTRAMUSCULAR | Status: DC

## 2024-04-28 MED ORDER — GLYCOPYRROLATE 0.2 MG/ML IJ SOLN
0.2000 mg | INTRAMUSCULAR | Status: DC | PRN
  Administered 2024-04-28: 0.2 mg via INTRAVENOUS
  Filled 2024-04-28: qty 1

## 2024-04-28 MED ORDER — HALOPERIDOL 0.5 MG PO TABS: 0.5000 mg | ORAL_TABLET | ORAL | Status: DC | PRN

## 2024-04-28 MED ORDER — MORPHINE SULFATE (PF) 2 MG/ML IV SOLN
1.0000 mg | INTRAVENOUS | Status: DC | PRN
  Administered 2024-04-28 (×3): 1 mg via INTRAVENOUS
  Filled 2024-04-28 (×3): qty 1

## 2024-04-28 MED ORDER — GLYCOPYRROLATE 0.2 MG/ML IJ SOLN: 0.2000 mg | INTRAMUSCULAR | Status: DC | PRN

## 2024-04-28 MED ORDER — SODIUM CHLORIDE 0.9 % IV BOLUS
1000.0000 mL | Freq: Once | INTRAVENOUS | Status: AC
  Administered 2024-04-28: 1000 mL via INTRAVENOUS

## 2024-04-28 MED ORDER — POLYVINYL ALCOHOL 1.4 % OP SOLN: 1.0000 [drp] | Freq: Four times a day (QID) | OPHTHALMIC | Status: DC | PRN

## 2024-04-28 MED ORDER — SODIUM CHLORIDE 0.9 % IV BOLUS
500.0000 mL | Freq: Once | INTRAVENOUS | Status: AC
  Administered 2024-04-28: 500 mL via INTRAVENOUS

## 2024-04-28 MED ORDER — HALOPERIDOL LACTATE 5 MG/ML IJ SOLN
2.0000 mg | Freq: Once | INTRAMUSCULAR | Status: AC
  Administered 2024-04-28: 2 mg via INTRAVENOUS

## 2024-04-28 NOTE — Plan of Care (Signed)
 Continuing with plan of care.

## 2024-04-28 NOTE — Progress Notes (Signed)
 OT Cancellation Note  Patient Details Name: Lucas Kline MRN: [id number] DOB: 04/13/1929   Cancelled Treatment:    Reason Eval/Treat Not Completed: Other (comment);Fatigue/lethargy limiting ability to participate. Per chart/conversation with care team, pt not appropriate for therapy services this date. Pending palliative consult/GOC meeting. Will continue to follow remotely and initiate OT services as appropriate.   Jhonny Pelton, M.S., OTR/L 04/28/24, 12:14 PM

## 2024-04-28 NOTE — Consult Note (Signed)
 Consultation Note Date: 04/28/2024 at 1330  Patient Name: Lucas Kline  DOB: May 17, 1929  MRN: [id number]  Age / Sex: 89 y.o., male  PCP: Milon Cleaves, [place] Referring Physician: Von Bellis, MD  HPI/Patient Profile: 89 y.o. male  with past medical history significant for A-fib, COPD, Alzheimer's disease. HTN, HLD, hypothyroidism and Parkinson's Disease. He was recently admitted 7/23-7/29/25 for PNA. He presented to [facility] ED in [facility] for weakness. He was found to have normal workup, diagnosed with FTT and discharged home without admission. Patient presented again to [facility] ED 04/27/2024  via EMS after being found unresponsive at home. EMS reported patient had SpO2 60% RA on scene with pinpoint pupils. He was given narcan with no response and only responded to painful stimuli.  Upon arrival to ED, patient was found to be hypoxic, obtunded and septic.  ED labs significant for Na+ 146, glucose 163, BUN 30, creatinine 1.06, albumin 3.1, AST 75, BNP 285, troponin 23, hemoglobin 12.9, lactic 4.8, procalcitonin 1.99 and INR 1.4. VBG pH 7.07/pCO2 101/pO2 30/bicarb 29.3 BP 139/104, HR 88, RR 25, SpO2 99% and rectal temp 91.6 F.  COVID/flu/RSV negative UA negative, UDS negative  CXR showed multifocal airspace disease throughout the right lung, most pronounced in right upper lung zone, may be secondary to aspiration or multifocal pneumonia. CT head showed no acute intracranial abnormality identified with mild to moderate heterogeneity in the thalami and cerebral white matter most commonly due to small vessel disease. CT C-spine showed no acute traumatic injury identified in cervical spine with mild for age cervical spine degeneration superimposed on degenerative C5-C6 ankylosis. CTA chest showed emphysema with severe bullous disease and architectural distortion of the left lower lobe.  Isolated possible small  nonocclusive PE in that lobe significance of which is doubtful with no other pulmonary artery filling defect.  Positive for right lung multi lobar pneumonia with retained secretions and opacification of right lung airways, suspicious for aspiration.  Only trace right pleural effusion. CTAP showed no acute or inflammatory process identified and mildly motion degraded abdomen and pelvis.  Patient was admitted to [facility] for management of acute hypoxic and hypercapnic respiratory failure in the setting of pneumonia: HCAP versus aspiration, severe sepsis due to pneumonia, mildly elevated troponin with suspected demand ischemia and acute metabolic encephalopathy.  Palliative care was consulted for assistance with goals of care conversations.  Clinical Assessment and Goals of Care:  Ill-appearing, frail, elderly male resting in bed with sitter at bedside.  He awakens to verbal and tactile stimuli.  He is unable to participate in goals of care conversation.  When asked how he is feeling he responds not bad.  When asked about pain he states yeah but is unable to identify location.  Appears slightly agitated with mitts in place.  Even, unlabored respirations.  He is in no distress.  Extensive chart review completed prior to meeting patient including labs, vital signs, imaging, progress notes, orders, and available advanced directive documents from current and previous encounters. I then met with patient,  daughter and son to discuss diagnosis prognosis, GOC, EOL wishes, disposition and options.  After meeting and ICU family room, I introduced Palliative Medicine as specialized medical care for people living with serious illness. It focuses on providing relief from the symptoms and stress of a serious illness. The goal is to improve quality of life for both the patient and the family.  We discussed a brief life review of the patient.  Mr. Bazinet was married for over 40 years prior to his wife passing.  All of  his family members have passed as well they had 2 children and 1 granddaughter.  Mr. Karwowski worked at [employer] for over 50 years before retiring.  As far as functional and nutritional status, Alm reports he has noticed a significant decline over the past several weeks.  They currently reside together and Alm serves as his caregiver.  His son shares that he has noticed decrease in eating and drinking and when he would drink fluids, he would cough and choke.  [name], daughter, states he was supposed to have thickener in his fluids but since their father frequently complained they eventually stopped thickening liquids.  Alm works in Therapist, music care and has been able to take his father to work over the past few years.  Normally, patient ambulates in the home and outside the home with a walker.  It has been in the past week that his father has not been able to ambulate normally.  [name] shares that he has been resistant to bathing or changing close most recently as well.  We discussed patient's current illness and what it means in the larger context of patient's on-going co-morbidities.  Natural disease trajectory and expectations at EOL were discussed.  I attempted to elicit values and goals of care important to the patient.  [name] shares her goal for her father at this time is for him to be able to participate in bedside swallow exam with possibility of eating.  She shares that he may never be able to go back home but they would like to see him get better as he has shown improvement since arrival yesterday.  Should he improve, they are open to answering to long-term care facility with hospice services.  The difference between aggressive medical intervention and comfort care was considered in light of the patient's goals of care.  [name] shares she is not ready at this time to transition her father to comfort care stating he never gave up on her and she is not going to give up on him.   Alm endorses being more open to the concept of comfort care but wants to support his sister's wishes as well.  Advance directives, concepts specific to code status, artificial feeding and hydration, and rehospitalization were considered and discussed.  Family confirms patient is a DNR with no CPR and no intubation.  They also would not want a feeding tube nor do they want invasive interventions such as central lines and opt for conservative medical management at this time.  Education offered regarding concept specific to human mortality and the limitations of medical interventions to prolong life when the body begins to fail to thrive.  Both [name] and Alm understand that their father is critically ill which is further complicated with advanced dementia and Parkinson's disease with extremely guarded prognosis and remains at risk for cardiac arrest and even death.  Family is facing treatment option decisions, advanced directive, and anticipatory care needs.    Discussed with patient/family  the importance of continued conversation with family and the medical providers regarding overall plan of care and treatment options, ensuring decisions are within the context of the patient's values and GOCs.    Hospice and Palliative Care services outpatient were explained and offered.  Family wants to allow time for outcomes and reevaluate status in 24 hours.  At that time they may be open to comfort care but do not wish their father to be moved to hospice home.  Questions and concerns were addressed. The family was encouraged to call with questions or concerns.   Primary Decision Maker NEXT OF KIN  Physical Exam Vitals reviewed.  Constitutional:      General: He is not in acute distress.    Appearance: He is ill-appearing.     Comments: Frail, cachectic  HENT:     Head: Normocephalic and atraumatic.     Mouth/Throat:     Mouth: Mucous membranes are dry.  Pulmonary:     Effort: Pulmonary effort  is normal. No respiratory distress.  Musculoskeletal:     Right lower leg: No edema.     Left lower leg: No edema.  Skin:    General: Skin is warm and dry.  Neurological:     Mental Status: He is disoriented.  Psychiatric:        Behavior: Behavior is agitated.    Recommendations/Plan: DNR/DNI Continue current supportive interventions Family request time for outcomes with reevaluation tomorrow PMT will continue to follow for goals of care discussions  Palliative Assessment/Data: 10%   Discussed plan of care with primary RN  Thank you for this consult. Palliative medicine will continue to follow and assist holistically.   Time Total: 90 minutes  Time spent includes: Detailed review of medical records (labs, imaging, vital signs), medically appropriate exam (mental status, respiratory, cardiac, skin), discussed with treatment team, counseling and educating patient, family and staff, documenting clinical information, medication management and coordination of care.     Devere Sacks, AMANDA [facility] Palliative Medicine Team  04/28/2024 9:36 AM  Office [phone]  Pager [phone]     Please contact Palliative Medicine Team providers via AMION for questions and concerns.

## 2024-04-28 NOTE — Progress Notes (Addendum)
 Daughter Jesusa)  is patient's HCPOA and requested to please do not notify her overnight if patient passes; after 7pm please do not call me. She would only like to be notified during daytime/morning. Information relayed to MD, NP and CN by this RN.   Lucas Kline said they will be using [facility] in [place] at the time of need.

## 2024-04-28 NOTE — Progress Notes (Signed)
 SLP Cancellation Note  Patient Details Name: Lucas Kline MRN: [id number] DOB: 06/14/1929   Cancelled treatment:       Reason Eval/Treat Not Completed: Fatigue/lethargy limiting ability to participate;Medical issues which prohibited therapy  RN requesting holding SLP evaluation to allow pt rest. Based on medical history (dementia, Parkinson's) and recurrent incidences of PNA- pt is at increased risk for aspiration. MD aware of risk and plan for holding eval. Pt is pending palliative consult. SLP will follow up with assessment pending decisions regarding GOC.   [place] Lynda Capistran Clapp, MS, CCC-SLP Speech Language Pathologist Rehab Services; [facility] [phone] (ascom)    [place] J Clapp 04/28/2024, 9:59 AM

## 2024-04-28 NOTE — Progress Notes (Signed)
 NAME:  Lucas Kline, MRN:  [id number], DOB:  05-Jan-1929, LOS: 1 ADMISSION DATE:  04/27/2024, CONSULTATION DATE:  04/27/2024 REFERRING MD:  Dr. Ernest, CHIEF COMPLAINT:  Altered Mental Status   Brief Pt Description / Synopsis:  89 y.o. male, DNR/DNI, with PMHx significant for A.fib on Eliquis , COPD, and Alzheimer's who is admitted with Acute Metabolic Encephalopathy due to Acute Hypoxic and Hypercapnic Respiratory Failure and Sepsis in the setting of Pneumonia (HCAP vs aspiration) requiring BiPAP.  History of Present Illness:  Lucas Kline is a pleasant 89 y.o. male with medical history significant for A-fib, COPD, Alzheimer's dementia, HTN, HLD, hypothyroidism, Parkinson's disease who presented to [facility] ED on 04/27/24 after being found unresponsive at home. Upon EMS arrival, he was noted to be hypoxic with O2 saturations of 60% on room air, along with pinpoint pupils with minimal response to Narcan.  His family reported he went to bed early last night which is unusual for him, and then this morning he found him obtunded in bed.  Of note he was recently admitted discharged from the hospital 3 weeks ago for weakness and pneumonia. He was treated with vancomycin  and Zosyn  and was discharged. He attended to [facility] emergency room on 04/22/2024 for generalized weakness. He was discharged home after workup being done at the emergency room.   05-06-24- patient with no acute events overnight.  He is frail in bed and going towards comfort care measures.   Pertinent  Medical History   Past Medical History:  Diagnosis Date   Adult BMI <19 kg/sq m 01/21/2020   Alzheimer disease (HCC)    Atherosclerotic heart disease of native coronary artery without angina pectoris    CHRONIC    Atrial fibrillation (HCC) 06/03/2022   B12 deficiency    CHRONIC   Bilateral carotid bruits 01/28/2020   Chronic atrial fibrillation (HCC) 01/21/2020   CKD (chronic kidney disease), stage II    MILD   CKD (chronic  kidney disease), stage III (HCC)    MODERATE (CHRONIC)   Contusion of left knee 09/28/2021   Coronary atherosclerosis    CHRONIC   Essential hypertension 01/28/2020   Essential hypertension, benign    Essential tremor    CHRONIC   Malnutrition of moderate degree (HCC) 01/21/2020   Memory deficit 09/08/2020   Mixed hearing loss, bilateral    CHRONIC   Mixed hyperlipidemia    CHRONIC    Neck pain 07/01/2021   Obstructive chronic bronchitis without exacerbation (HCC) 01/21/2020   Osteoarthritis    OF KNEE CHRONIC   Personal history of malignant neoplasm of larynx 01/18/2020   Primary hypothyroidism 01/22/2020   Unstable gait 01/21/2020    Micro Data:  8/15: Blood cultures x2>> no growth to date 8/15: RVP>> 8/15: COVID/FLU/RSV PCR>> negative 8/15: Strep pneumo urinary antigen>> 8/15: Legionella urinary antigen>> 8/15: Sputum >> 8/15: MRSA PCR>>  Antimicrobials:   Anti-infectives (From admission, onward)    Start     Dose/Rate Route Frequency Ordered Stop   06-May-2024 1000  vancomycin  (VANCOREADY) IVPB 750 mg/150 mL        750 mg 150 mL/hr over 60 Minutes Intravenous Every 48 hours 04/27/24 1437     04/27/24 1400  azithromycin  (ZITHROMAX ) 500 mg in sodium chloride  0.9 % 250 mL IVPB        500 mg 250 mL/hr over 60 Minutes Intravenous Every 24 hours 04/27/24 1206 04/30/24 1359   04/27/24 1400  piperacillin -tazobactam (ZOSYN ) IVPB 3.375 g  3.375 g 12.5 mL/hr over 240 Minutes Intravenous Every 8 hours 04/27/24 1226     04/27/24 0900  ceFEPIme  (MAXIPIME ) 2 g in sodium chloride  0.9 % 100 mL IVPB        2 g 200 mL/hr over 30 Minutes Intravenous  Once 04/27/24 0845 04/27/24 0942   04/27/24 0900  metroNIDAZOLE  (FLAGYL ) IVPB 500 mg        500 mg 100 mL/hr over 60 Minutes Intravenous  Once 04/27/24 0845 04/27/24 1018   04/27/24 0900  vancomycin  (VANCOCIN ) IVPB 1000 mg/200 mL premix        1,000 mg 200 mL/hr over 60 Minutes Intravenous  Once 04/27/24 0845 04/27/24 1122         Significant Hospital Events: Including procedures, antibiotic start and stop dates in addition to other pertinent events   8/15: Presents to ED with AMS, found to be hypoxic and hypercapnic, placed on BiPAP.  Family confirms DNR/DNI status.  BP soft, currently not requiring vasopressors, PCCM consulted in case he were to need pressors.  Interim History / Subjective:  As outlined above under Significant Hospital Events section  Objective   Blood pressure (!) 81/57, pulse 71, temperature (!) 97.4 F (36.3 C), temperature source Axillary, resp. rate 20, height 5' 6 (1.676 m), weight 47.1 kg, SpO2 (!) 89%.    FiO2 (%):  [40 %-100 %] 100 % PEEP:  [5 cmH20] 5 cmH20 Pressure Support:  [10 cmH20] 10 cmH20   Intake/Output Summary (Last 24 hours) at 04/28/2024 0859 Last data filed at 04/28/2024 9182 Gross per 24 hour  Intake 385.31 ml  Output 600 ml  Net -214.69 ml   Filed Weights   04/27/24 0844 04/27/24 1515  Weight: 43.9 kg 47.1 kg    Examination: General: Acute on chronically ill appearing frail elderly male, laying in bed, on BiPAP, with mild respiratory distress HENT: Atraumatic, normocephalic, neck supple, no JVD, dry MM Lungs: Coarse breath sounds throughout, BiPAP assisted, mild tachypnea, even Cardiovascular: RRR, no M/R/G Abdomen: Flat, soft, nondistended, nontender, no guarding or rebound tenderness Extremities: Cachetic, no deformities, no edema or cyanosis Neuro: Somnolent, withdraws and opens to painful stimuli, currently unable to follow commands, pupils PERRL GU: Deferred  Resolved Hospital Problem list     Assessment & Plan:   #Acute Hypoxic & Hypercapnic Respiratory Failure in the setting of ... #Pneumonia: HCAP vs Aspiration  #AECOPD -Supplemental O2 as needed to maintain O2 sats 88 to 92% -BiPAP, wean as tolerated (family confirms DNR/DNI) -Follow intermittent Chest X-ray & ABG as needed -Bronchodilators  -IV Steroids -ABX as above -Diuresis as  BP and renal function permits -Pulmonary toilet as able  #Met SIRS Criteria on presentation: Temperature 91.6 F, RR >20 #Severe Sepsis due to Pneumonia: HCAP vs Aspiration  -Monitor fever curve -Trend WBC's & Procalcitonin -Follow cultures as above -Continue empiric Azithromycin  & Zosyn  pending cultures & sensitivities  #Mildly elevated Troponin, suspect demand ischemia PMHx: HTN, A.fib on Eliquis  -Echocardiogram 06/17/22: LVEF 60-65%, grade I DD, RV size and RV systolic function both normal, normal pulmonary artery systolic pressure -Continuous cardiac monitoring -Maintain MAP >65 -IV fluids -Vasopressors as needed to maintain MAP goal -Trend lactic acid until normalized -Trend HS Troponin until peaked -Consider Echocardiogram  -Hold Eliquis  for now, has been ordered for treatment dose Lovenox  by primary service   #Mild Hypernatremia -Monitor I&O's / urinary output -Follow BMP -Ensure adequate renal perfusion -Avoid nephrotoxic agents as able -Replace electrolytes as indicated ~ Pharmacy following for assistance with electrolyte replacement  #  Acute Metabolic Encephalopathy #CO2 Narcosis -CT Head negative for acute intracranial abnormality -UDS is negative -Treatment of metabolic derangements and hypercapnia as outlined above -Provide supportive care -Promote normal sleep/wake cycle and family presence -Avoid sedating medications as able   Best Practice (right click and Reselect all SmartList Selections daily)   Diet/type: NPO DVT prophylaxis: LMWH GI prophylaxis: N/A Lines: N/A Foley:  N/A Code Status:  DNR Last date of multidisciplinary goals of care discussion [N/A]   Labs   CBC: Recent Labs  Lab 04/27/24 0845 04/28/24 0714  WBC 8.3 4.3  NEUTROABS 7.3  --   HGB 12.9* 11.5*  HCT 40.6 34.4*  MCV 100.0 97.2  PLT 157 108*    Basic Metabolic Panel: Recent Labs  Lab 04/27/24 0845 04/28/24 0714  NA 146* 142  K 4.6 4.1  CL 107 109  CO2 29 25   GLUCOSE 163* 106*  BUN 30* 28*  CREATININE 1.06 1.10  CALCIUM 8.6* 8.0*  MG 2.3 2.0   GFR: Estimated Creatinine Clearance: 28.5 mL/min (by C-G formula based on SCr of 1.1 mg/dL). Recent Labs  Lab 04/27/24 0845 04/27/24 1045 04/27/24 1140 04/28/24 0714  PROCALCITON  --   --  1.99  --   WBC 8.3  --   --  4.3  LATICACIDVEN 4.8* 2.4*  --  2.0*    Liver Function Tests: Recent Labs  Lab 04/27/24 0845 04/28/24 0714  AST 75* 51*  ALT 36 47*  ALKPHOS 53 38  BILITOT 0.6 0.8  PROT 6.3* 5.4*  ALBUMIN 3.1* 2.6*   Recent Labs  Lab 04/27/24 0845  LIPASE 31   No results for input(s): AMMONIA in the last 168 hours.  ABG    Component Value Date/Time   PHART 7.3 (L) 04/27/2024 1431   PCO2ART 49 (H) 04/27/2024 1431   PO2ART 220 (H) 04/27/2024 1431   HCO3 24.1 04/27/2024 1431   ACIDBASEDEF 2.8 (H) 04/27/2024 1431   O2SAT 99.8 04/27/2024 1431     Coagulation Profile: Recent Labs  Lab 04/27/24 0845 04/28/24 0714  INR 1.4* 1.7*    Cardiac Enzymes: No results for input(s): CKTOTAL, CKMB, CKMBINDEX, TROPONINI in the last 168 hours.  HbA1C: Hgb A1c MFr Bld  Date/Time Value Ref Range Status  10/03/2023 09:59 AM 5.5 4.8 - 5.6 % Final    Comment:             Prediabetes: 5.7 - 6.4          Diabetes: >6.4          Glycemic control for adults with diabetes: <7.0     CBG: Recent Labs  Lab 04/28/24 0753  GLUCAP 91    Review of Systems:   Unable to assess due to AMS   Past Medical History:  He,  has a past medical history of Adult BMI <19 kg/sq m (01/21/2020), Alzheimer disease (HCC), Atherosclerotic heart disease of native coronary artery without angina pectoris, Atrial fibrillation (HCC) (06/03/2022), B12 deficiency, Bilateral carotid bruits (01/28/2020), Chronic atrial fibrillation (HCC) (01/21/2020), CKD (chronic kidney disease), stage II, CKD (chronic kidney disease), stage III (HCC), Contusion of left knee (09/28/2021), Coronary atherosclerosis,  Essential hypertension (01/28/2020), Essential hypertension, benign, Essential tremor, Malnutrition of moderate degree (HCC) (01/21/2020), Memory deficit (09/08/2020), Mixed hearing loss, bilateral, Mixed hyperlipidemia, Neck pain (07/01/2021), Obstructive chronic bronchitis without exacerbation (HCC) (01/21/2020), Osteoarthritis, Personal history of malignant neoplasm of larynx (01/18/2020), Primary hypothyroidism (01/22/2020), and Unstable gait (01/21/2020).   Surgical History:   Past Surgical History:  Procedure  Laterality Date   CATARACT EXTRACTION, BILATERAL       Social History:   reports that he quit smoking about 17 years ago. His smoking use included cigarettes. His smokeless tobacco use includes chew. He reports that he does not drink alcohol  and does not use drugs.   Family History:  His family history is negative for Hypertension, Heart disease, Cancer, and Diabetes.   Allergies Allergies  Allergen Reactions   Aricept [Donepezil] Other (See Comments)    Abdominal pain, diarrhea     Home Medications  Prior to Admission medications   Medication Sig Start Date End Date Taking? Authorizing Provider  apixaban  (ELIQUIS ) 2.5 MG TABS tablet Take 1 tablet (2.5 mg total) by mouth 2 (two) times daily. 01/16/24   Milon Cleaves, PA  aspirin  EC 81 MG tablet Take 81 mg by mouth daily. Swallow whole.    [provider]  bacitracin  ointment Apply 1 Application topically 2 (two) times daily. 10/18/23   Milon Cleaves, PA  carbidopa -levodopa  (SINEMET  IR) 25-100 MG tablet Take 1 tablet by mouth 3 (three) times daily. 03/26/24 03/26/25  [provider]  ipratropium-albuterol  (DUONEB) 0.5-2.5 (3) MG/3ML SOLN Take 3 mLs by nebulization every 6 (six) hours as needed. 04/04/24   [provider]  levothyroxine  (SYNTHROID ) 75 MCG tablet Take 75 mcg by mouth daily before breakfast.    [provider]  melatonin 3 MG TABS tablet Take 6 mg by mouth at bedtime as needed. 04/10/24    [provider]  memantine  (NAMENDA ) 10 MG tablet TAKE 1 TABLET BY MOUTH TWICE A DAY 12/14/23   Milon Cleaves, PA  simvastatin  (ZOCOR ) 20 MG tablet TAKE 1 TABLET BY MOUTH EVERY DAY 03/13/24   Revankar, Jennifer SAUNDERS, MD  verapamil  (CALAN -SR) 120 MG CR tablet Take 120 mg by mouth daily. 04/05/24 05/10/25  [provider]     Critical care provider statement:   Total critical care time: 33 minutes   Performed by: Parris MD   Critical care time was exclusive of separately billable procedures and treating other patients.   Critical care was necessary to treat or prevent imminent or life-threatening deterioration.   Critical care was time spent personally by me on the following activities: development of treatment plan with patient and/or surrogate as well as nursing, discussions with consultants, evaluation of patient's response to treatment, examination of patient, obtaining history from patient or surrogate, ordering and performing treatments and interventions, ordering and review of laboratory studies, ordering and review of radiographic studies, pulse oximetry and re-evaluation of patient's condition.    Reeves Musick, M.D.  Pulmonary & Critical Care Medicine

## 2024-04-28 NOTE — Progress Notes (Signed)
 [facility] Progress Note  Patient: Lucas Kline    FMW:[id number]  DOA: 04/27/2024     Date of Service: the patient was seen and examined on 04/28/2024  Chief Complaint  Patient presents with   Altered Mental Status   Brief hospital course:  Below H&P reviewed  ETTERYohann Curl is a pleasant 89 y.o. male with medical history significant for A-fib, COPD, Alzheimer's dementia, HTN, HLD, hypothyroidism, Parkinson's disease who was recently admitted discharged from the hospital 3 weeks ago for weakness and pneumonia.  He was treated with vancomycin  and Zosyn  and was discharged.  He attended to [facility] emergency room on 04/22/2024 for generalized weakness.  He was discharged home after workup being done at the emergency room. Today he was found unresponsive at home.  When EMS arrived at home he was found to have a saturation in 60% in room air, pinpoint pupils and patient was placed on nonrebreather where he was given a dose of Narcan with some minimal response.  Patient was given some IV fluid on arrival.  Patient is altered responds to only painful stimuli on BiPAP, not able to provide any history. I spoke to patient's son Lucas Kline over the phone.  Lucas Kline said that patient went to bed last night early that is unusual to him.  This morning he found obtunded on his bed.  Then he called EMS and brought him to the hospital.  He also told me that he has been going back and forth to hospital in clinic for cough shortness of breath pneumonia and dehydration and not getting fully better.   ED Course: Upon arrival to the ED, patient is found to be hypoxemic, obtunded, septic.  He was given IV fluid, IV antibiotics, BiPAP, oxygen  and hospitalist service was consulted for evaluation for admission.  Critical care team was consulted for evaluation as well.  Critical care team advised that patient can be admitted to stepdown unit under hospitalist service and they will follow-up if needed.  Patient was  given Zosyn  and vancomycin  and cultures were sent from the emergency room.)   Assessment and Plan:  Severe sepsis secondary to pneumonia, possible aspiration, POA. Patient was hypoxemic, tachycardic, tachypneic, lactic acidosis with RUL pneumonia - s/p Zosyn  and vancomycin  in the ED and Ivf - s/p BiPAP for acute hypoxemic respiratory failure due to sepsis and pneumonia - Continue oxygen  to maintain saturation more than 90% - Continue Zosyn  and vancomycin    # Acute hypoxemic respiratory failure due to pneumonia and sepsis - Continue BiPAP, patient is DNR/DNI - Continue oxygen  to maintain saturation more than 90% - Continue antibiotics as mentioned above   # Pneumonia most likely due to aspiration  In view of advanced dementia - Continue Zosyn  and vancomycin  Follow SLP eval Keep n.p.o. for now - Follow-up cultures   # COPD with exacerbation - Continue oxygen  BiPAP, antibiotics and steroid - Continue nebulization once patient able to get it.   # Atrial fibrillation on Eliquis  - Rate has been well-controlled - Continue current regimen - Held Eliquis  for now due to n.p.o. status risk of aspiration  Started therapeutic Lovenox    Goals of care discussion was done by palliative care with family.  Continue current treatment for another 24 hours and then they will decide for further management. Overall prognosis is very poor.  Patient is DNR DNI Order was placed for RN to pronounce death if patient passes away. In the meantime we will continue current treatment.    Body mass index  is 16.76 kg/m.  Interventions:  Diet: NPO, risk of aspiration DVT Prophylaxis: Subcutaneous Lovenox    Advance goals of care discussion: DNR/DNI-Limited   Family Communication: family was not present at bedside, at the time of interview.  Patient is demented, unable to comprehend. Palliative care discussed goals of care with family.  Disposition:  Pt is from Home, admitted with sepsis, aspiration  PNA, still very sick, which precludes a safe discharge. Discharge to SN vs Hsopice TBD, when stable.  Subjective: No significant events overnight, patient remained demented and hypothermic, able to arouse, trying to communicate but hard to understand, seems disoriented.  Unable to offer any complaints.  Very cachectic and lethargic.  Physical Exam: General: NAD, lying comfortably, very obtunded, arousable Appear in no distress Eyes: closed, briefly open ENT: Oral Mucosa Clear, dry Neck: no JVD,  Cardiovascular: S1 and S2 Present, no Murmur,  Respiratory: Equal air entry bilaterally, mild crackles, no wheezes  Abdomen: Bowel Sound present, Soft and no tenderness,  Skin: no rashes Extremities: no Pedal edema, no calf tenderness Neurologic: without any new focal findings Gait not checked due to patient safety concerns  Vitals:   04/28/24 0630 04/28/24 0700 04/28/24 0800 04/28/24 1200  BP: 98/72 (!) 117/90 (!) 81/57   Pulse:   71   Resp: (!) 29 (!) 23 20   Temp:   (!) 97.4 F (36.3 C) (!) (P) 97.5 F (36.4 C)  TempSrc:   Axillary (P) Axillary  SpO2:   (!) 89%   Weight:      Height:        Intake/Output Summary (Last 24 hours) at 04/28/2024 1353 Last data filed at 04/28/2024 9045 Gross per 24 hour  Intake 812.03 ml  Output 600 ml  Net 212.03 ml   Filed Weights   04/27/24 0844 04/27/24 1515  Weight: 43.9 kg 47.1 kg    Data Reviewed: I have personally reviewed and interpreted daily labs, tele strips, imagings as discussed above. I reviewed all nursing notes, pharmacy notes, vitals, pertinent old records I have discussed plan of care as described above with RN and patient/family.  CBC: Recent Labs  Lab 04/27/24 0845 04/28/24 0714  WBC 8.3 4.3  NEUTROABS 7.3  --   HGB 12.9* 11.5*  HCT 40.6 34.4*  MCV 100.0 97.2  PLT 157 108*   Basic Metabolic Panel: Recent Labs  Lab 04/27/24 0845 04/28/24 0714 04/28/24 0741  NA 146* 142  --   K 4.6 4.1  --   CL 107 109  --    CO2 29 25  --   GLUCOSE 163* 106*  --   BUN 30* 28*  --   CREATININE 1.06 1.10  --   CALCIUM 8.6* 8.0*  --   MG 2.3 2.0  --   PHOS  --   --  3.1    Studies: No results found.  Scheduled Meds:  aspirin  EC  81 mg Oral Daily   carbidopa -levodopa   1 tablet Oral TID   Chlorhexidine  Gluconate Cloth  6 each Topical Daily   enoxaparin  (LOVENOX ) injection  45 mg Subcutaneous Q24H   guaiFENesin   600 mg Oral BID   levothyroxine   75 mcg Oral QAC breakfast   memantine   10 mg Oral BID   methylPREDNISolone  (SOLU-MEDROL ) injection  40 mg Intravenous Q12H   midodrine   10 mg Oral TID WC   Continuous Infusions:  sodium chloride  100 mL/hr at 04/28/24 1000   azithromycin  500 mg (04/28/24 1314)   dexmedetomidine  (PRECEDEX ) IV infusion  0.6 mcg/kg/hr (04/28/24 0954)   piperacillin -tazobactam (ZOSYN )  IV Stopped (04/28/24 0825)   [START ON 28-May-2024] vancomycin      PRN Meds: acetaminophen  **OR** acetaminophen , chlorpheniramine-HYDROcodone, ipratropium-albuterol , morphine  injection, ondansetron  **OR** ondansetron  (ZOFRAN ) IV, mouth rinse, polyethylene glycol  Time spent: 35 minutes  Author: ELVAN SOR. MD [facility] 04/28/2024 1:53 PM  To reach On-call, see care teams to locate the attending and reach out to them via [email or web address]. If 7PM-7AM, please contact night-coverage If you still have difficulty reaching the attending provider, please page the [facility] (Director on Call) for [facility] on amion for assistance.

## 2024-04-28 NOTE — Care Management Important Message (Signed)
 Important Message  Patient Details  Name: Lucas Kline MRN: [id number] Date of Birth: 01-15-1929   Important Message Given:  Yes - Medicare IM     Rojelio [name] 04/28/2024, 12:59 PM

## 2024-04-28 NOTE — Progress Notes (Signed)
 Orders placed by MD to ambulate patient in room and hallway. Patient not appropriately safe for ambulation at this time due to AMS. MD made aware by this RN. Patient bathed, full linen change, bair hugger applied to patient. Patient moving frequently causing bair hugger to not remain in place. Reapplied frequently. Sitter remains at bedside. Precedex  titrated per patient requirements. SLP eval cancelled d/t AMS. MD aware by SLP. Vitals WNL at this time. Will continue to monitor closely.

## 2024-04-28 NOTE — Progress Notes (Signed)
 PHARMACY - ANTICOAGULATION CONSULT NOTE  Pharmacy Consult for enoxaparin  Indication: atrial fibrillation  Allergies  Allergen Reactions   Aricept [Donepezil] Other (See Comments)    Abdominal pain, diarrhea    Patient Measurements: Height: 5' 6 (167.6 cm) Weight: 47.1 kg (103 lb 13.4 oz) IBW/kg (Calculated) : 63.8 HEPARIN DW (KG): 47.1  Vital Signs: Temp: 97.4 F (36.3 C) (08/16 0800) Temp Source: Axillary (08/16 0800) BP: 81/57 (08/16 0800) Pulse Rate: 71 (08/16 0800)  Labs: Recent Labs    04/27/24 0845 04/27/24 1140 04/28/24 0714  HGB 12.9*  --  11.5*  HCT 40.6  --  34.4*  PLT 157  --  108*  LABPROT 17.5*  --  21.3*  INR 1.4*  --  1.7*  CREATININE 1.06  --  1.10  TROPONINIHS 23* 29*  --     Estimated Creatinine Clearance: 28.5 mL/min (by C-G formula based on SCr of 1.1 mg/dL).   Medical History: Past Medical History:  Diagnosis Date   Adult BMI <19 kg/sq m 01/21/2020   Alzheimer disease (HCC)    Atherosclerotic heart disease of native coronary artery without angina pectoris    CHRONIC    Atrial fibrillation (HCC) 06/03/2022   B12 deficiency    CHRONIC   Bilateral carotid bruits 01/28/2020   Chronic atrial fibrillation (HCC) 01/21/2020   CKD (chronic kidney disease), stage II    MILD   CKD (chronic kidney disease), stage III (HCC)    MODERATE (CHRONIC)   Contusion of left knee 09/28/2021   Coronary atherosclerosis    CHRONIC   Essential hypertension 01/28/2020   Essential hypertension, benign    Essential tremor    CHRONIC   Malnutrition of moderate degree (HCC) 01/21/2020   Memory deficit 09/08/2020   Mixed hearing loss, bilateral    CHRONIC   Mixed hyperlipidemia    CHRONIC    Neck pain 07/01/2021   Obstructive chronic bronchitis without exacerbation (HCC) 01/21/2020   Osteoarthritis    OF KNEE CHRONIC   Personal history of malignant neoplasm of larynx 01/18/2020   Primary hypothyroidism 01/22/2020   Unstable gait 01/21/2020     Medications:  Scheduled:   aspirin  EC  81 mg Oral Daily   carbidopa -levodopa   1 tablet Oral TID   Chlorhexidine  Gluconate Cloth  6 each Topical Daily   enoxaparin  (LOVENOX ) injection  30 mg Subcutaneous Q24H   guaiFENesin   600 mg Oral BID   levothyroxine   75 mcg Oral QAC breakfast   memantine   10 mg Oral BID   methylPREDNISolone  (SOLU-MEDROL ) injection  40 mg Intravenous Q12H   midodrine   10 mg Oral TID WC    Assessment: 89 y.o. male with medical history significant for A-fib, COPD, Alzheimer's dementia, HTN, HLD, hypothyroidism, Parkinson's disease admitted with PNA  Goal of Therapy:  Monitor platelets by anticoagulation protocol: Yes   Plan:  ---will adjust enoxaparin  to 1 mg/kg subcutaneously every 24 hours ---monitor renal function for needed dose adjustments  Lucas Kline 04/28/2024,9:09 AM

## 2024-04-28 NOTE — Progress Notes (Signed)
 Safety Sitter [place] at bedside with patient. Order renewed for 1:1

## 2024-04-28 NOTE — Plan of Care (Signed)
  Problem: Clinical Measurements: Goal: Ability to maintain clinical measurements within normal limits will improve Outcome: Progressing Goal: Will remain free from infection Outcome: Progressing Goal: Diagnostic test results will improve Outcome: Progressing Goal: Respiratory complications will improve Outcome: Progressing Goal: Cardiovascular complication will be avoided Outcome: Progressing   Problem: Respiratory: Goal: Levels of oxygenation will improve Outcome: Progressing

## 2024-04-28 NOTE — Progress Notes (Addendum)
       CROSS COVER NOTE  NAME: Lucas Kline MRN: [id number] DOB : 06-Nov-1928    Concern as stated by nurse / staff   Bolus is done, BP 79/50 map 60. HR 43. Also decompensating from respiratory standpoint, sats of 88% on 10 L up from 5 L when I assumed care at 1900.      Pertinent findings on chart review: Palliative care consult done earlier.  They spoke to son and daughter and they were holding off on placing patient on comfort care to see how he responded to treatment.  Patient currently on Precedex  drip and not on pressors  Patient Assessment    04/28/2024   10:00 PM 04/28/2024    9:30 PM 04/28/2024    9:00 PM  Vitals with BMI  Systolic 79 110 82  Diastolic 50 93 50  Pulse 39 68 43     Assessment and  Interventions   Assessment:  Severe sepsis/septic shock clinically deteriorating Patient actively dying  Plan: Called and spoke to son Alm Gage who stated that he was expecting this outcome all night.  He states that he is ready to place his dad comfort care.  He understands and agrees that all treatment, all diagnostic testing will be discontinued and patient will be kept comfortable. Communicated with nurse Patient requiring Precedex  for agitation.  Will keep in ICU if unable to wean off Precedex  .  If able to wean to Haldol , will transfer to MedSurg

## 2024-04-28 NOTE — Progress Notes (Signed)
 At 1600 patient was noted to be restless and agitated which led to him pulling out his PIV, new PIV placed and patient tolerated well.  Patient's precidex gtt was reinitiated and patient was relieved of agitation and restlessness.  Patient's became hypotensive and gtt rate adjusted, normal saline bolus administered as well, patient was only able to tolerate a max rate of 248mL/hr of bolus due to poor vasculature, provider notified.

## 2024-04-28 NOTE — Progress Notes (Signed)
 PT Cancellation Note  Patient Details Name: [name] MRN: [id number] DOB: 10-26-1928   Cancelled Treatment:    Reason Eval/Treat Not Completed: Patient not medically ready. Secure chat with RN, who reports patient is not appropriate and is actively dying. Will follow up tomorrow.     Jaston Havens 04/28/2024, 11:44 AM

## 2024-04-29 DIAGNOSIS — R634 Abnormal weight loss: Secondary | ICD-10-CM | POA: Insufficient documentation

## 2024-04-29 DIAGNOSIS — Z711 Person with feared health complaint in whom no diagnosis is made: Secondary | ICD-10-CM

## 2024-04-29 DIAGNOSIS — L600 Ingrowing nail: Secondary | ICD-10-CM | POA: Insufficient documentation

## 2024-04-29 DIAGNOSIS — Z515 Encounter for palliative care: Secondary | ICD-10-CM | POA: Diagnosis not present

## 2024-04-29 DIAGNOSIS — G20A1 Parkinson's disease without dyskinesia, without mention of fluctuations: Secondary | ICD-10-CM | POA: Insufficient documentation

## 2024-04-29 DIAGNOSIS — A419 Sepsis, unspecified organism: Secondary | ICD-10-CM | POA: Diagnosis not present

## 2024-04-29 LAB — BLOOD CULTURE ID PANEL (REFLEXED) - BCID2

## 2024-04-29 LAB — LEGIONELLA PNEUMOPHILA SEROGP 1 UR AG: L. pneumophila Serogp 1 Ur Ag: NEGATIVE

## 2024-04-30 ENCOUNTER — Telehealth: Payer: Self-pay

## 2024-04-30 ENCOUNTER — Ambulatory Visit: Admitting: Physical Therapy

## 2024-04-30 LAB — GLUCOSE, CAPILLARY: Glucose-Capillary: 99 mg/dL (ref 70–99)

## 2024-04-30 NOTE — Telephone Encounter (Unsigned)
 Copied from CRM #[id number]. Topic: General - Deceased Patient >> 05/18/24 12:04 PM Winona R wrote: Name of caller: [name] Ishihara- Daughter   Date of death: 17-May-2024  Name of funeral home: [employer]- [employer] number of funeral home: [phone]  Provider that needs to sign form: PCP Nola Angles  Timeline for signing: No signing needed at the moment

## 2024-05-01 ENCOUNTER — Ambulatory Visit

## 2024-05-01 ENCOUNTER — Encounter: Admitting: Speech Pathology

## 2024-05-02 ENCOUNTER — Ambulatory Visit: Admitting: Physical Therapy

## 2024-05-02 LAB — CULTURE, BLOOD (ROUTINE X 2): Culture: NO GROWTH

## 2024-05-03 ENCOUNTER — Ambulatory Visit

## 2024-05-03 ENCOUNTER — Ambulatory Visit: Admitting: Physical Therapy

## 2024-05-04 ENCOUNTER — Ambulatory Visit: Admitting: Physical Therapy

## 2024-05-07 ENCOUNTER — Ambulatory Visit

## 2024-05-09 ENCOUNTER — Ambulatory Visit: Admitting: Physical Therapy

## 2024-05-14 NOTE — Progress Notes (Signed)
 PHARMACY - PHYSICIAN COMMUNICATION CRITICAL VALUE ALERT - BLOOD CULTURE IDENTIFICATION (BCID)  Lucas Kline is an 89 y.o. male who presented to [facility] on 04/27/2024 with a chief complaint of sepsis.   Assessment:  Staph species in 1 of 4 bottles, no resistance.  (include suspected source if known)  Name of physician (or Provider) Contacted: Lorretta Robes, NP   Current antibiotics: none, pt on comfort care   Changes to prescribed antibiotics recommended:  No abx coverage needed,  pt is EOL, on comfort care.   Results for orders placed or performed during the hospital encounter of 04/27/24  Blood Culture ID Panel (Reflexed) (Collected: 04/27/2024  8:45 AM)  Result Value Ref Range   Enterococcus faecalis NOT DETECTED NOT DETECTED   Enterococcus Faecium NOT DETECTED NOT DETECTED   Listeria monocytogenes NOT DETECTED NOT DETECTED   Staphylococcus species DETECTED (A) NOT DETECTED   Staphylococcus aureus (BCID) NOT DETECTED NOT DETECTED   Staphylococcus epidermidis NOT DETECTED NOT DETECTED   Staphylococcus lugdunensis NOT DETECTED NOT DETECTED   Streptococcus species NOT DETECTED NOT DETECTED   Streptococcus agalactiae NOT DETECTED NOT DETECTED   Streptococcus pneumoniae NOT DETECTED NOT DETECTED   Streptococcus pyogenes NOT DETECTED NOT DETECTED   A.calcoaceticus-baumannii NOT DETECTED NOT DETECTED   Bacteroides fragilis NOT DETECTED NOT DETECTED   Enterobacterales NOT DETECTED NOT DETECTED   Enterobacter cloacae complex NOT DETECTED NOT DETECTED   Escherichia coli NOT DETECTED NOT DETECTED   Klebsiella aerogenes NOT DETECTED NOT DETECTED   Klebsiella oxytoca NOT DETECTED NOT DETECTED   Klebsiella pneumoniae NOT DETECTED NOT DETECTED   Proteus species NOT DETECTED NOT DETECTED   Salmonella species NOT DETECTED NOT DETECTED   Serratia marcescens NOT DETECTED NOT DETECTED   Haemophilus influenzae NOT DETECTED NOT DETECTED   Neisseria meningitidis NOT DETECTED NOT  DETECTED   Pseudomonas aeruginosa NOT DETECTED NOT DETECTED   Stenotrophomonas maltophilia NOT DETECTED NOT DETECTED   Candida albicans NOT DETECTED NOT DETECTED   Candida auris NOT DETECTED NOT DETECTED   Candida glabrata NOT DETECTED NOT DETECTED   Candida krusei NOT DETECTED NOT DETECTED   Candida parapsilosis NOT DETECTED NOT DETECTED   Candida tropicalis NOT DETECTED NOT DETECTED   Cryptococcus neoformans/gattii NOT DETECTED NOT DETECTED    Lucas Kline D 2024-04-30  7:17 AM

## 2024-05-14 NOTE — Progress Notes (Signed)
 Patients daughter called and stated that her and her daughter would not be coming back to the hospital. She asked if we could call her if her father passes as long as it was not at night when they are sleeping.

## 2024-05-14 NOTE — Death Summary Note (Signed)
 DEATH SUMMARY   Patient Details  Name: Lucas Kline MRN: [id number] DOB: [birth year] ERE:Rmjqu, [place], [place] Admission/Discharge Information   Admit Date:  2024/05/25  Date of Death: Date of Death: May 27, 2024  Time of Death: Time of Death: 1357-06-05  Length of Stay: 2   Principle Cause of death: Sepsis due to Aspiration pneumonia    Hospital Diagnoses: Principal Problem:   Sepsis (HCC) Active Problems:   Mixed hyperlipidemia   Alzheimer disease (HCC)   Chronic atrial fibrillation (HCC)   Primary hypothyroidism   Essential hypertension   COPD exacerbation (HCC)   CAP (community acquired pneumonia)   Hospital Course: Below H&P reviewed   ETTERZarian Colpitts is a pleasant 89 y.o. male with medical history significant for A-fib, COPD, Alzheimer's dementia, HTN, HLD, hypothyroidism, Parkinson's disease who was recently admitted discharged from the hospital 3 weeks ago for weakness and pneumonia.  He was treated with vancomycin  and Zosyn  and was discharged.  He attended to [facility] emergency room on 04/22/2024 for generalized weakness.  He was discharged home after workup being done at the emergency room. Today he was found unresponsive at home.  When EMS arrived at home he was found to have a saturation in 60% in room air, pinpoint pupils and patient was placed on nonrebreather where he was given a dose of Narcan with some minimal response.  Patient was given some IV fluid on arrival.  Patient is altered responds to only painful stimuli on BiPAP, not able to provide any history. I spoke to patient's son Alm over the phone.  Alm said that patient went to bed last night early that is unusual to him.  This morning he found obtunded on his bed.  Then he called EMS and brought him to the hospital.  He also told me that he has been going back and forth to hospital in clinic for cough shortness of breath pneumonia and dehydration and not getting fully better.   ED Course: Upon arrival to the  ED, patient is found to be hypoxemic, obtunded, septic.  He was given IV fluid, IV antibiotics, BiPAP, oxygen  and hospitalist service was consulted for evaluation for admission.  Critical care team was consulted for evaluation as well.  Critical care team advised that patient can be admitted to stepdown unit under hospitalist service and they will follow-up if needed.  Patient was given Zosyn  and vancomycin  and cultures were sent from the emergency room.)   Goals of care discussion was done by palliative care with family.  Continue current treatment for another 24 hours and then they will decide for further management. Overall prognosis is very poor.  Patient is DNR DNI Order was placed for RN to pronounce death if patient passes away. In the meantime we will continue current treatment.   05/27/2024 overnight patient's condition got deteriorated and on-call attending discussed with family and patient was transferred to comfort measures only.  All the treatment was stopped. Patient passed away today at 1358, death was pronounced by RN.   During hospital stay patient was managed as below. Assessment and Plan:  Severe sepsis secondary to pneumonia, possible aspiration, POA. Patient was hypoxemic, tachycardic, tachypneic, lactic acidosis with RUL pneumonia - s/p Zosyn  and vancomycin  in the ED and Ivf - s/p BiPAP for acute hypoxemic respiratory failure due to sepsis and pneumonia - Continue oxygen  to maintain saturation more than 90% - Continue Zosyn  and vancomycin    # Acute hypoxemic respiratory failure due to pneumonia and sepsis - Continue  BiPAP, patient is DNR/DNI - Continue oxygen  to maintain saturation more than 90% - Continue antibiotics as mentioned above   # Pneumonia most likely due to aspiration  In view of advanced dementia - Continue Zosyn  and vancomycin  Follow SLP eval Keep n.p.o. for now - Follow-up cultures   # COPD with exacerbation - Continue oxygen  BiPAP, antibiotics and  steroid - Continue nebulization once patient able to get it.   # Atrial fibrillation on Eliquis  - Rate has been well-controlled - Continue current regimen - Held Eliquis  for now due to n.p.o. status risk of aspiration  Started therapeutic Lovenox            Procedures: None  Consultations: PCCM and palliative care  The results of significant diagnostics from this hospitalization (including imaging, microbiology, ancillary and laboratory) are listed below for reference.   Significant Diagnostic Studies: CT ABDOMEN PELVIS W CONTRAST Result Date: 04/27/2024 CLINICAL DATA:  89 year old male found unresponsive this morning. EXAM: CT ABDOMEN AND PELVIS WITH CONTRAST TECHNIQUE: Multidetector CT imaging of the abdomen and pelvis was performed using the standard protocol following bolus administration of intravenous contrast. RADIATION DOSE REDUCTION: This exam was performed according to the departmental dose-optimization program which includes automated exposure control, adjustment of the mA and/or kV according to patient size and/or use of iterative reconstruction technique. CONTRAST:  75mL OMNIPAQUE  IOHEXOL  350 MG/ML SOLN COMPARISON:  Abnormal Chest CTA today reported separately. Noncontrast CT Abdomen and Pelvis 02/04/2010. FINDINGS: Lower chest: CTA chest reported separately today. Hepatobiliary: Liver and gallbladder appear within normal limits when allowing for mild motion artifact. No perihepatic fluid. Pancreas: Negative. Spleen: Negative. Adrenals/Urinary Tract: Normal adrenal glands. Symmetric bilateral renal cortical volume loss. No hydronephrosis. Symmetric renal enhancement and contrast excretion. Nondilated ureters and urinary bladder. Occasional pelvic phleboliths. Stomach/Bowel: Redundant but decompressed distal large bowel in the pelvis. Sigmoid diverticulosis. No active inflammation identified. Decompressed descending and distal transverse colon. Mild upstream proximal transverse and  right colon retained stool. Paucity of visceral fat. No large bowel inflammation identified. Nondilated small bowel. Decompressed stomach. No pneumoperitoneum. No free fluid identified. Vascular/Lymphatic: Aortoiliac calcified atherosclerosis. Tortuous abdominal aorta. Major arterial structures in the abdomen and pelvis remain patent. Portal venous system appears to be patent. No lymphadenopathy. Reproductive: Negative. Other: No pelvis free fluid. Musculoskeletal: No acute osseous abnormality identified. Mild for age lumbar spine degeneration. IMPRESSION: 1. No acute or inflammatory process identified in mildly motion degraded abdomen and pelvis. See CTA Chest reported separately. 2.  Aortic Atherosclerosis (ICD10-I70.0). Electronically Signed   By: VEAR Hurst M.D.   On: 04/27/2024 10:38   CT Cervical Spine Wo Contrast Result Date: 04/27/2024 CLINICAL DATA:  89 year old male found unresponsive this morning. EXAM: CT CERVICAL SPINE WITHOUT CONTRAST TECHNIQUE: Multidetector CT imaging of the cervical spine was performed without intravenous contrast. Multiplanar CT image reconstructions were also generated. RADIATION DOSE REDUCTION: This exam was performed according to the departmental dose-optimization program which includes automated exposure control, adjustment of the mA and/or kV according to patient size and/or use of iterative reconstruction technique. COMPARISON:  Head and chest CT today reported separately. FINDINGS: Alignment: Straightening of cervical lordosis. Mild cervical scoliosis. Maintained cervicothoracic junction posterior element alignment. Skull base and vertebrae: Bone mineralization is within normal limits for age. Visualized skull base is intact. No atlanto-occipital dissociation. C1 and C2 appear intact and aligned. No acute osseous abnormality identified. Soft tissues and spinal canal: No prevertebral fluid or swelling. No visible canal hematoma. Negative visible noncontrast neck soft tissues.  Disc levels: Generally mild  for age cervical spine degeneration. There is degenerative ankylosis of the C5-C6 levels. No significant cervical spinal stenosis by CT. Upper chest: Mild and chronic appearing T2 superior endplate compression. Chest CTA today reported separately. IMPRESSION: 1. No acute traumatic injury identified in the cervical spine. 2. Mild for age cervical spine degeneration superimposed on degenerative C5-C6 ankylosis. Electronically Signed   By: VEAR Hurst M.D.   On: 04/27/2024 10:34   CT HEAD WO CONTRAST ( ) Result Date: 04/27/2024 CLINICAL DATA:  89 year old male found unresponsive this morning. EXAM: CT HEAD WITHOUT CONTRAST TECHNIQUE: Contiguous axial images were obtained from the base of the skull through the vertex without intravenous contrast. RADIATION DOSE REDUCTION: This exam was performed according to the departmental dose-optimization program which includes automated exposure control, adjustment of the mA and/or kV according to patient size and/or use of iterative reconstruction technique. FINDINGS: Brain: Cerebral volume may be normal for age. No midline shift, ventriculomegaly, mass effect, evidence of mass lesion, intracranial hemorrhage or evidence of cortically based acute infarction. Patchy mild to moderate for age periventricular and scattered white matter hypodensity. Mild to moderate heterogeneity in the bilateral thalami. No cortical encephalomalacia identified. Vascular: Calcified atherosclerosis at the skull base. No suspicious intracranial vascular hyperdensity. Skull: Intact.  No acute osseous abnormality identified. Sinuses/Orbits: Visualized paranasal sinuses and mastoids are well aerated. Right sphenoid sinus periosteal thickening. Other: No acute orbit or scalp soft tissue finding. IMPRESSION: 1. No acute intracranial abnormality identified. 2. Mild to moderate heterogeneity in the thalami and cerebral white matter most commonly due to small vessel disease.  Electronically Signed   By: VEAR Hurst M.D.   On: 04/27/2024 10:32   CT Angio Chest PE W and/or Wo Contrast Result Date: 04/27/2024 CLINICAL DATA:  89 year old male found unresponsive this morning. EXAM: CT ANGIOGRAPHY CHEST WITH CONTRAST TECHNIQUE: Multidetector CT imaging of the chest was performed using the standard protocol during bolus administration of intravenous contrast. Multiplanar CT image reconstructions and MIPs were obtained to evaluate the vascular anatomy. RADIATION DOSE REDUCTION: This exam was performed according to the departmental dose-optimization program which includes automated exposure control, adjustment of the mA and/or kV according to patient size and/or use of iterative reconstruction technique. CONTRAST:  75mL OMNIPAQUE  IOHEXOL  350 MG/ML SOLN COMPARISON:  Portable chest 0901 hours today. CT Abdomen and Pelvis today reported separately. FINDINGS: Cardiovascular: Excellent contrast bolus timing in the pulmonary arterial tree. No saddle or lobar pulmonary embolus. Left lower lobe segmental branch nonocclusive thrombus suggested on series 7, image 255, long segment and continuing distally. No other pulmonary artery filling defect is identified. Normal heart size. No pericardial effusion. Calcified aortic atherosclerosis. Coronary artery atherosclerosis. Mediastinum/Nodes: Negative for mediastinal mass or lymphadenopathy. Reactive appearing small bilateral hilar lymph nodes. Lungs/Pleura: Major airways are patent. Centrilobular emphysema, severe in the left lower lobe with bullous disease there and architectural distortion. Widespread Patchy and confluent right lung opacity is both peribronchial and peripheral. Areas of early consolidation in all 3 lobes. Trace right pleural effusion. Frothy retained secretions at the carina and subtotal opacification of the bronchus intermedius and right lower lobe airways. More preserved patency of the left side airways. Upper Abdomen: CT Abdomen and Pelvis  reported separately. Musculoskeletal: Maintained vertebral height. No acute or suspicious osseous lesion identified. Review of the MIP images confirms the above findings. IMPRESSION: 1. Emphysema (ICD10-J43.9), with severe bullous disease and architectural distortion in the left lower lobe. Isolated possible small nonocclusive PE in that lobe, significance of which is doubtful. And no other  pulmonary artery filling defect. 2. Positive for Right lung Multilobar Pneumonia with retained secretions and opacification of right lung airways, suspicious for Aspiration. Only trace right pleural effusion. 3.  Aortic Atherosclerosis (ICD10-I70.0) 4.  CT Abdomen and Pelvis reported separately. Electronically Signed   By: VEAR Hurst M.D.   On: 04/27/2024 10:29   DG Chest Port 1 View Result Date: 04/27/2024 CLINICAL DATA:  Concern for sepsis.  Patient found unresponsive. EXAM: PORTABLE CHEST 1 VIEW COMPARISON:  12/27/2021 FINDINGS: The heart size and mediastinal contours are within normal limits. Multifocal airspace opacities throughout the right lung, most pronounced in the right upper lung zone. Severe chronic emphysematous changes. No pleural effusion or pneumothorax. No acute osseous abnormality identified. IMPRESSION: Multifocal airspace disease throughout the right lung, most pronounced in the right upper lung zone, may be secondary to aspiration or multifocal pneumonia. Electronically Signed   By: Harrietta Sherry M.D.   On: 04/27/2024 09:12    Microbiology: Recent Results (from the past 240 hours)  Resp panel by RT-PCR (RSV, Flu A&B, Covid) Anterior Nasal Swab     Status: None   Collection Time: 04/27/24  8:45 AM   Specimen: Anterior Nasal Swab  Result Value Ref Range Status   SARS Coronavirus 2 by RT PCR NEGATIVE NEGATIVE Final    Comment: (NOTE) SARS-CoV-2 target nucleic acids are NOT DETECTED.  The SARS-CoV-2 RNA is generally detectable in upper respiratory specimens during the acute phase of infection.  The lowest concentration of SARS-CoV-2 viral copies this assay can detect is 138 copies/mL. A negative result does not preclude SARS-Cov-2 infection and should not be used as the sole basis for treatment or other patient management decisions. A negative result may occur with  improper specimen collection/handling, submission of specimen other than nasopharyngeal swab, presence of viral mutation(s) within the areas targeted by this assay, and inadequate number of viral copies(<138 copies/mL). A negative result must be combined with clinical observations, patient history, and epidemiological information. The expected result is Negative.  Fact Sheet for Patients:  BloggerCourse.com  Fact Sheet for Healthcare Providers:  SeriousBroker.it  This test is no t yet approved or cleared by the [place]  FDA and  has been authorized for detection and/or diagnosis of SARS-CoV-2 by FDA under an Emergency Use Authorization (EUA). This EUA will remain  in effect (meaning this test can be used) for the duration of the COVID-19 declaration under Section 564(b)(1) of the Act, 21 U.S.C.section 360bbb-3(b)(1), unless the authorization is terminated  or revoked sooner.       Influenza A by PCR NEGATIVE NEGATIVE Final   Influenza B by PCR NEGATIVE NEGATIVE Final    Comment: (NOTE) The Xpert Xpress SARS-CoV-2/FLU/RSV plus assay is intended as an aid in the diagnosis of influenza from Nasopharyngeal swab specimens and should not be used as a sole basis for treatment. Nasal washings and aspirates are unacceptable for Xpert Xpress SARS-CoV-2/FLU/RSV testing.  Fact Sheet for Patients: BloggerCourse.com  Fact Sheet for Healthcare Providers: SeriousBroker.it  This test is not yet approved or cleared by the [place]  FDA and has been authorized for detection and/or diagnosis of SARS-CoV-2 by FDA  under an Emergency Use Authorization (EUA). This EUA will remain in effect (meaning this test can be used) for the duration of the COVID-19 declaration under Section 564(b)(1) of the Act, 21 U.S.C. section 360bbb-3(b)(1), unless the authorization is terminated or revoked.     Resp Syncytial Virus by PCR NEGATIVE NEGATIVE Final    Comment: (NOTE) Fact Sheet  for Patients: BloggerCourse.com  Fact Sheet for Healthcare Providers: SeriousBroker.it  This test is not yet approved or cleared by the [place]  FDA and has been authorized for detection and/or diagnosis of SARS-CoV-2 by FDA under an Emergency Use Authorization (EUA). This EUA will remain in effect (meaning this test can be used) for the duration of the COVID-19 declaration under Section 564(b)(1) of the Act, 21 U.S.C. section 360bbb-3(b)(1), unless the authorization is terminated or revoked.  Performed at [facility], [address] [place], [place] [address]   Blood Culture (routine x 2)     Status: None (Preliminary result)   Collection Time: 04/27/24  8:45 AM   Specimen: BLOOD  Result Value Ref Range Status   Specimen Description   Final    BLOOD Performed at [facility], [address] [place], [place] [address]    Special Requests   Final    BAA Blood Culture results may not be optimal due to an inadequate volume of blood received in culture bottles Performed at [facility], [address] [place], [place] [address]    Culture  Setup Time   Final    GRAM POSITIVE COCCI AEROBIC BOTTLE ONLY CRITICAL RESULT CALLED TO, READ BACK BY AND VERIFIED WITH: JASON ROBBINS 05/22/2024 0625 SLM    Culture GRAM POSITIVE COCCI  Final   Report Status PENDING  Incomplete  Blood Culture ID Panel (Reflexed)     Status: Abnormal   Collection Time: 04/27/24  8:45 AM  Result Value Ref Range Status   Enterococcus faecalis NOT DETECTED NOT DETECTED  Final   Enterococcus Faecium NOT DETECTED NOT DETECTED Final   Listeria monocytogenes NOT DETECTED NOT DETECTED Final   Staphylococcus species DETECTED (A) NOT DETECTED Final    Comment: CRITICAL RESULT CALLED TO, READ BACK BY AND VERIFIED WITH: JASON ROBBINS 05-22-2024 0625 SLM    Staphylococcus aureus (BCID) NOT DETECTED NOT DETECTED Final   Staphylococcus epidermidis NOT DETECTED NOT DETECTED Final   Staphylococcus lugdunensis NOT DETECTED NOT DETECTED Final   Streptococcus species NOT DETECTED NOT DETECTED Final   Streptococcus agalactiae NOT DETECTED NOT DETECTED Final   Streptococcus pneumoniae NOT DETECTED NOT DETECTED Final   Streptococcus pyogenes NOT DETECTED NOT DETECTED Final   A.calcoaceticus-baumannii NOT DETECTED NOT DETECTED Final   Bacteroides fragilis NOT DETECTED NOT DETECTED Final   Enterobacterales NOT DETECTED NOT DETECTED Final   Enterobacter cloacae complex NOT DETECTED NOT DETECTED Final   Escherichia coli NOT DETECTED NOT DETECTED Final   Klebsiella aerogenes NOT DETECTED NOT DETECTED Final   Klebsiella oxytoca NOT DETECTED NOT DETECTED Final   Klebsiella pneumoniae NOT DETECTED NOT DETECTED Final   Proteus species NOT DETECTED NOT DETECTED Final   Salmonella species NOT DETECTED NOT DETECTED Final   Serratia marcescens NOT DETECTED NOT DETECTED Final   Haemophilus influenzae NOT DETECTED NOT DETECTED Final   Neisseria meningitidis NOT DETECTED NOT DETECTED Final   Pseudomonas aeruginosa NOT DETECTED NOT DETECTED Final   Stenotrophomonas maltophilia NOT DETECTED NOT DETECTED Final   Candida albicans NOT DETECTED NOT DETECTED Final   Candida auris NOT DETECTED NOT DETECTED Final   Candida glabrata NOT DETECTED NOT DETECTED Final   Candida krusei NOT DETECTED NOT DETECTED Final   Candida parapsilosis NOT DETECTED NOT DETECTED Final   Candida tropicalis NOT DETECTED NOT DETECTED Final   Cryptococcus neoformans/gattii NOT DETECTED NOT DETECTED Final    Comment:  Performed at [facility], [address] [place], [place] [address]  Blood Culture (routine x 2)     Status: None (Preliminary result)   Collection Time: 04/27/24  8:50 AM   Specimen: BLOOD  Result Value Ref Range Status   Specimen Description BLOOD RIGHT ANTECUBITAL  Final   Special Requests   Final    BOTTLES DRAWN AEROBIC AND ANAEROBIC Blood Culture results may not be optimal due to an inadequate volume of blood received in culture bottles   Culture   Final    NO GROWTH 2 DAYS Performed at [facility], [address], [place], [place] [address]    Report Status PENDING  Incomplete  MRSA Next Gen by PCR, Nasal     Status: None   Collection Time: 04/27/24  3:33 PM   Specimen: Nasal Mucosa; Nasal Swab  Result Value Ref Range Status   MRSA by PCR Next Gen NOT DETECTED NOT DETECTED Final    Comment: (NOTE) The GeneXpert MRSA Assay (FDA approved for NASAL specimens only), is one component of a comprehensive MRSA colonization surveillance program. It is not intended to diagnose MRSA infection nor to guide or monitor treatment for MRSA infections. Test performance is not FDA approved in patients less than 52 years old. Performed at [facility], [address] [place], [place] [address]   Respiratory (~20 pathogens) panel by PCR     Status: None   Collection Time: 04/27/24  5:13 PM   Specimen: Nasopharyngeal Swab; Respiratory  Result Value Ref Range Status   Adenovirus NOT DETECTED NOT DETECTED Final   Coronavirus 229E NOT DETECTED NOT DETECTED Final    Comment: (NOTE) The Coronavirus on the Respiratory Panel, DOES NOT test for the novel  Coronavirus (2019 nCoV)    Coronavirus HKU1 NOT DETECTED NOT DETECTED Final   Coronavirus NL63 NOT DETECTED NOT DETECTED Final   Coronavirus OC43 NOT DETECTED NOT DETECTED Final   Metapneumovirus NOT DETECTED NOT DETECTED Final   Rhinovirus / Enterovirus NOT DETECTED NOT DETECTED Final   Influenza A NOT DETECTED  NOT DETECTED Final   Influenza B NOT DETECTED NOT DETECTED Final   Parainfluenza Virus 1 NOT DETECTED NOT DETECTED Final   Parainfluenza Virus 2 NOT DETECTED NOT DETECTED Final   Parainfluenza Virus 3 NOT DETECTED NOT DETECTED Final   Parainfluenza Virus 4 NOT DETECTED NOT DETECTED Final   Respiratory Syncytial Virus NOT DETECTED NOT DETECTED Final   Bordetella pertussis NOT DETECTED NOT DETECTED Final   Bordetella Parapertussis NOT DETECTED NOT DETECTED Final   Chlamydophila pneumoniae NOT DETECTED NOT DETECTED Final   Mycoplasma pneumoniae NOT DETECTED NOT DETECTED Final    Comment: Performed at [facility], [address], [place], [place] [address]    Time spent: 35 minutes  Signed: Elvan Sor, MD 28-May-2024

## 2024-05-14 NOTE — Assessment & Plan Note (Signed)
 Difficulty with ADLs due to lower extremity weakness and Parkinson's disease. Requires assistance with mobility and some personal care tasks. - Refer to podiatry for toenail care. - Consider use of removable shower head for easier bathing. - Consider use of shower chair for safety during bathing.

## 2024-05-14 NOTE — Assessment & Plan Note (Signed)
 Parkinson's disease diagnosed during recent hospitalization. Medication improved symptoms, but gait abnormality and lower extremity weakness persist, affecting mobility. - Refer to [facility] for physical therapy. - Encourage use of rollator with supervision for mobility.

## 2024-05-14 NOTE — Assessment & Plan Note (Signed)
 Uncontrolled Continues to struggle with SOB Will send Nebulizer prescription Continue to monitor symptoms Low threshold for hospital return due to difficulties with respiration

## 2024-05-14 NOTE — Death Summary Note (Incomplete)
 DEATH SUMMARY   Patient Details  Name: Lucas Kline MRN: [id number] DOB: Mar 19, 1929 ERE:[place], [place], [place] Admission/Discharge Information   Admit Date:  05/17/24  Date of Death: Date of Death: May 19, 2024  Time of Death: Time of Death: May 28, 1357  Length of Stay: 2   Principle Cause of death: ***  Hospital Diagnoses: Principal Problem:   Sepsis (HCC) Active Problems:   Mixed hyperlipidemia   Alzheimer disease (HCC)   Chronic atrial fibrillation (HCC)   Primary hypothyroidism   Essential hypertension   COPD exacerbation (HCC)   CAP (community acquired pneumonia)   Hospital Course: No notes on file  Assessment and Plan: No notes have been filed under this hospital service. Service: Hospitalist     {Tip this will not be part of the note when signed Body mass index is 16.76 kg/m. , ,  Active Pressure Injury/Wound(s)     None          (Optional):26781}   Procedures: ***  Consultations: ***  The results of significant diagnostics from this hospitalization (including imaging, microbiology, ancillary and laboratory) are listed below for reference.   Significant Diagnostic Studies: CT ABDOMEN PELVIS W CONTRAST Result Date: 2024/05/17 CLINICAL DATA:  89 year old male found unresponsive this morning. EXAM: CT ABDOMEN AND PELVIS WITH CONTRAST TECHNIQUE: Multidetector CT imaging of the abdomen and pelvis was performed using the standard protocol following bolus administration of intravenous contrast. RADIATION DOSE REDUCTION: This exam was performed according to the departmental dose-optimization program which includes automated exposure control, adjustment of the mA and/or kV according to patient size and/or use of iterative reconstruction technique. CONTRAST:  75mL OMNIPAQUE  IOHEXOL  350 MG/ML SOLN COMPARISON:  Abnormal Chest CTA today reported separately. Noncontrast CT Abdomen and Pelvis 02/04/2010. FINDINGS: Lower chest: CTA chest reported separately today.  Hepatobiliary: Liver and gallbladder appear within normal limits when allowing for mild motion artifact. No perihepatic fluid. Pancreas: Negative. Spleen: Negative. Adrenals/Urinary Tract: Normal adrenal glands. Symmetric bilateral renal cortical volume loss. No hydronephrosis. Symmetric renal enhancement and contrast excretion. Nondilated ureters and urinary bladder. Occasional pelvic phleboliths. Stomach/Bowel: Redundant but decompressed distal large bowel in the pelvis. Sigmoid diverticulosis. No active inflammation identified. Decompressed descending and distal transverse colon. Mild upstream proximal transverse and right colon retained stool. Paucity of visceral fat. No large bowel inflammation identified. Nondilated small bowel. Decompressed stomach. No pneumoperitoneum. No free fluid identified. Vascular/Lymphatic: Aortoiliac calcified atherosclerosis. Tortuous abdominal aorta. Major arterial structures in the abdomen and pelvis remain patent. Portal venous system appears to be patent. No lymphadenopathy. Reproductive: Negative. Other: No pelvis free fluid. Musculoskeletal: No acute osseous abnormality identified. Mild for age lumbar spine degeneration. IMPRESSION: 1. No acute or inflammatory process identified in mildly motion degraded abdomen and pelvis. See CTA Chest reported separately. 2.  Aortic Atherosclerosis (ICD10-I70.0). Electronically Signed   By: VEAR Hurst M.D.   On: 05-17-24 10:38   CT Cervical Spine Wo Contrast Result Date: May 17, 2024 CLINICAL DATA:  89 year old male found unresponsive this morning. EXAM: CT CERVICAL SPINE WITHOUT CONTRAST TECHNIQUE: Multidetector CT imaging of the cervical spine was performed without intravenous contrast. Multiplanar CT image reconstructions were also generated. RADIATION DOSE REDUCTION: This exam was performed according to the departmental dose-optimization program which includes automated exposure control, adjustment of the mA and/or kV according to  patient size and/or use of iterative reconstruction technique. COMPARISON:  Head and chest CT today reported separately. FINDINGS: Alignment: Straightening of cervical lordosis. Mild cervical scoliosis. Maintained cervicothoracic junction posterior element alignment. Skull base and vertebrae: Bone mineralization  is within normal limits for age. Visualized skull base is intact. No atlanto-occipital dissociation. C1 and C2 appear intact and aligned. No acute osseous abnormality identified. Soft tissues and spinal canal: No prevertebral fluid or swelling. No visible canal hematoma. Negative visible noncontrast neck soft tissues. Disc levels: Generally mild for age cervical spine degeneration. There is degenerative ankylosis of the C5-C6 levels. No significant cervical spinal stenosis by CT. Upper chest: Mild and chronic appearing T2 superior endplate compression. Chest CTA today reported separately. IMPRESSION: 1. No acute traumatic injury identified in the cervical spine. 2. Mild for age cervical spine degeneration superimposed on degenerative C5-C6 ankylosis. Electronically Signed   By: VEAR Hurst M.D.   On: 04/27/2024 10:34   CT HEAD WO CONTRAST ( ) Result Date: 04/27/2024 CLINICAL DATA:  89 year old male found unresponsive this morning. EXAM: CT HEAD WITHOUT CONTRAST TECHNIQUE: Contiguous axial images were obtained from the base of the skull through the vertex without intravenous contrast. RADIATION DOSE REDUCTION: This exam was performed according to the departmental dose-optimization program which includes automated exposure control, adjustment of the mA and/or kV according to patient size and/or use of iterative reconstruction technique. FINDINGS: Brain: Cerebral volume may be normal for age. No midline shift, ventriculomegaly, mass effect, evidence of mass lesion, intracranial hemorrhage or evidence of cortically based acute infarction. Patchy mild to moderate for age periventricular and scattered white  matter hypodensity. Mild to moderate heterogeneity in the bilateral thalami. No cortical encephalomalacia identified. Vascular: Calcified atherosclerosis at the skull base. No suspicious intracranial vascular hyperdensity. Skull: Intact.  No acute osseous abnormality identified. Sinuses/Orbits: Visualized paranasal sinuses and mastoids are well aerated. Right sphenoid sinus periosteal thickening. Other: No acute orbit or scalp soft tissue finding. IMPRESSION: 1. No acute intracranial abnormality identified. 2. Mild to moderate heterogeneity in the thalami and cerebral white matter most commonly due to small vessel disease. Electronically Signed   By: VEAR Hurst M.D.   On: 04/27/2024 10:32   CT Angio Chest PE W and/or Wo Contrast Result Date: 04/27/2024 CLINICAL DATA:  89 year old male found unresponsive this morning. EXAM: CT ANGIOGRAPHY CHEST WITH CONTRAST TECHNIQUE: Multidetector CT imaging of the chest was performed using the standard protocol during bolus administration of intravenous contrast. Multiplanar CT image reconstructions and MIPs were obtained to evaluate the vascular anatomy. RADIATION DOSE REDUCTION: This exam was performed according to the departmental dose-optimization program which includes automated exposure control, adjustment of the mA and/or kV according to patient size and/or use of iterative reconstruction technique. CONTRAST:  75mL OMNIPAQUE  IOHEXOL  350 MG/ML SOLN COMPARISON:  Portable chest 0901 hours today. CT Abdomen and Pelvis today reported separately. FINDINGS: Cardiovascular: Excellent contrast bolus timing in the pulmonary arterial tree. No saddle or lobar pulmonary embolus. Left lower lobe segmental branch nonocclusive thrombus suggested on series 7, image 255, long segment and continuing distally. No other pulmonary artery filling defect is identified. Normal heart size. No pericardial effusion. Calcified aortic atherosclerosis. Coronary artery atherosclerosis. Mediastinum/Nodes:  Negative for mediastinal mass or lymphadenopathy. Reactive appearing small bilateral hilar lymph nodes. Lungs/Pleura: Major airways are patent. Centrilobular emphysema, severe in the left lower lobe with bullous disease there and architectural distortion. Widespread Patchy and confluent right lung opacity is both peribronchial and peripheral. Areas of early consolidation in all 3 lobes. Trace right pleural effusion. Frothy retained secretions at the carina and subtotal opacification of the bronchus intermedius and right lower lobe airways. More preserved patency of the left side airways. Upper Abdomen: CT Abdomen and Pelvis reported separately. Musculoskeletal: Maintained  vertebral height. No acute or suspicious osseous lesion identified. Review of the MIP images confirms the above findings. IMPRESSION: 1. Emphysema (ICD10-J43.9), with severe bullous disease and architectural distortion in the left lower lobe. Isolated possible small nonocclusive PE in that lobe, significance of which is doubtful. And no other pulmonary artery filling defect. 2. Positive for Right lung Multilobar Pneumonia with retained secretions and opacification of right lung airways, suspicious for Aspiration. Only trace right pleural effusion. 3.  Aortic Atherosclerosis (ICD10-I70.0) 4.  CT Abdomen and Pelvis reported separately. Electronically Signed   By: VEAR Hurst M.D.   On: 04/27/2024 10:29   DG Chest Port 1 View Result Date: 04/27/2024 CLINICAL DATA:  Concern for sepsis.  Patient found unresponsive. EXAM: PORTABLE CHEST 1 VIEW COMPARISON:  12/27/2021 FINDINGS: The heart size and mediastinal contours are within normal limits. Multifocal airspace opacities throughout the right lung, most pronounced in the right upper lung zone. Severe chronic emphysematous changes. No pleural effusion or pneumothorax. No acute osseous abnormality identified. IMPRESSION: Multifocal airspace disease throughout the right lung, most pronounced in the right  upper lung zone, may be secondary to aspiration or multifocal pneumonia. Electronically Signed   By: Harrietta Sherry M.D.   On: 04/27/2024 09:12    Microbiology: Recent Results (from the past 240 hours)  Resp panel by RT-PCR (RSV, Flu A&B, Covid) Anterior Nasal Swab     Status: None   Collection Time: 04/27/24  8:45 AM   Specimen: Anterior Nasal Swab  Result Value Ref Range Status   SARS Coronavirus 2 by RT PCR NEGATIVE NEGATIVE Final    Comment: (NOTE) SARS-CoV-2 target nucleic acids are NOT DETECTED.  The SARS-CoV-2 RNA is generally detectable in upper respiratory specimens during the acute phase of infection. The lowest concentration of SARS-CoV-2 viral copies this assay can detect is 138 copies/mL. A negative result does not preclude SARS-Cov-2 infection and should not be used as the sole basis for treatment or other patient management decisions. A negative result may occur with  improper specimen collection/handling, submission of specimen other than nasopharyngeal swab, presence of viral mutation(s) within the areas targeted by this assay, and inadequate number of viral copies(<138 copies/mL). A negative result must be combined with clinical observations, patient history, and epidemiological information. The expected result is Negative.  Fact Sheet for Patients:  BloggerCourse.com  Fact Sheet for Healthcare Providers:  SeriousBroker.it  This test is no t yet approved or cleared by the [place]  FDA and  has been authorized for detection and/or diagnosis of SARS-CoV-2 by FDA under an Emergency Use Authorization (EUA). This EUA will remain  in effect (meaning this test can be used) for the duration of the COVID-19 declaration under Section 564(b)(1) of the Act, 21 U.S.C.section 360bbb-3(b)(1), unless the authorization is terminated  or revoked sooner.       Influenza A by PCR NEGATIVE NEGATIVE Final   Influenza B by  PCR NEGATIVE NEGATIVE Final    Comment: (NOTE) The Xpert Xpress SARS-CoV-2/FLU/RSV plus assay is intended as an aid in the diagnosis of influenza from Nasopharyngeal swab specimens and should not be used as a sole basis for treatment. Nasal washings and aspirates are unacceptable for Xpert Xpress SARS-CoV-2/FLU/RSV testing.  Fact Sheet for Patients: BloggerCourse.com  Fact Sheet for Healthcare Providers: SeriousBroker.it  This test is not yet approved or cleared by the [place]  FDA and has been authorized for detection and/or diagnosis of SARS-CoV-2 by FDA under an Emergency Use Authorization (EUA). This EUA will remain in  effect (meaning this test can be used) for the duration of the COVID-19 declaration under Section 564(b)(1) of the Act, 21 U.S.C. section 360bbb-3(b)(1), unless the authorization is terminated or revoked.     Resp Syncytial Virus by PCR NEGATIVE NEGATIVE Final    Comment: (NOTE) Fact Sheet for Patients: BloggerCourse.com  Fact Sheet for Healthcare Providers: SeriousBroker.it  This test is not yet approved or cleared by the [place]  FDA and has been authorized for detection and/or diagnosis of SARS-CoV-2 by FDA under an Emergency Use Authorization (EUA). This EUA will remain in effect (meaning this test can be used) for the duration of the COVID-19 declaration under Section 564(b)(1) of the Act, 21 U.S.C. section 360bbb-3(b)(1), unless the authorization is terminated or revoked.  Performed at [facility], [address] [place], [place] [address]   Blood Culture (routine x 2)     Status: None (Preliminary result)   Collection Time: 04/27/24  8:45 AM   Specimen: BLOOD  Result Value Ref Range Status   Specimen Description   Final    BLOOD Performed at [facility], [address] [place], [place] [address]    Special  Requests   Final    BAA Blood Culture results may not be optimal due to an inadequate volume of blood received in culture bottles Performed at [facility], [address] [place], [place] [address]    Culture  Setup Time   Final    GRAM POSITIVE COCCI AEROBIC BOTTLE ONLY CRITICAL RESULT CALLED TO, READ BACK BY AND VERIFIED WITH: JASON ROBBINS 2024/05/18 0625 SLM    Culture GRAM POSITIVE COCCI  Final   Report Status PENDING  Incomplete  Blood Culture ID Panel (Reflexed)     Status: Abnormal   Collection Time: 04/27/24  8:45 AM  Result Value Ref Range Status   Enterococcus faecalis NOT DETECTED NOT DETECTED Final   Enterococcus Faecium NOT DETECTED NOT DETECTED Final   Listeria monocytogenes NOT DETECTED NOT DETECTED Final   Staphylococcus species DETECTED (A) NOT DETECTED Final    Comment: CRITICAL RESULT CALLED TO, READ BACK BY AND VERIFIED WITH: JASON ROBBINS May 18, 2024 0625 SLM    Staphylococcus aureus (BCID) NOT DETECTED NOT DETECTED Final   Staphylococcus epidermidis NOT DETECTED NOT DETECTED Final   Staphylococcus lugdunensis NOT DETECTED NOT DETECTED Final   Streptococcus species NOT DETECTED NOT DETECTED Final   Streptococcus agalactiae NOT DETECTED NOT DETECTED Final   Streptococcus pneumoniae NOT DETECTED NOT DETECTED Final   Streptococcus pyogenes NOT DETECTED NOT DETECTED Final   A.calcoaceticus-baumannii NOT DETECTED NOT DETECTED Final   Bacteroides fragilis NOT DETECTED NOT DETECTED Final   Enterobacterales NOT DETECTED NOT DETECTED Final   Enterobacter cloacae complex NOT DETECTED NOT DETECTED Final   Escherichia coli NOT DETECTED NOT DETECTED Final   Klebsiella aerogenes NOT DETECTED NOT DETECTED Final   Klebsiella oxytoca NOT DETECTED NOT DETECTED Final   Klebsiella pneumoniae NOT DETECTED NOT DETECTED Final   Proteus species NOT DETECTED NOT DETECTED Final   Salmonella species NOT DETECTED NOT DETECTED Final   Serratia marcescens NOT DETECTED NOT DETECTED  Final   Haemophilus influenzae NOT DETECTED NOT DETECTED Final   Neisseria meningitidis NOT DETECTED NOT DETECTED Final   Pseudomonas aeruginosa NOT DETECTED NOT DETECTED Final   Stenotrophomonas maltophilia NOT DETECTED NOT DETECTED Final   Candida albicans NOT DETECTED NOT DETECTED Final   Candida auris NOT DETECTED NOT DETECTED Final   Candida glabrata NOT DETECTED NOT DETECTED Final  Candida krusei NOT DETECTED NOT DETECTED Final   Candida parapsilosis NOT DETECTED NOT DETECTED Final   Candida tropicalis NOT DETECTED NOT DETECTED Final   Cryptococcus neoformans/gattii NOT DETECTED NOT DETECTED Final    Comment: Performed at [facility], [address] [place], [place] [address]  Blood Culture (routine x 2)     Status: None (Preliminary result)   Collection Time: 04/27/24  8:50 AM   Specimen: BLOOD  Result Value Ref Range Status   Specimen Description BLOOD RIGHT ANTECUBITAL  Final   Special Requests   Final    BOTTLES DRAWN AEROBIC AND ANAEROBIC Blood Culture results may not be optimal due to an inadequate volume of blood received in culture bottles   Culture   Final    NO GROWTH 2 DAYS Performed at [facility], [address] [place], [place] [address]    Report Status PENDING  Incomplete  MRSA Next Gen by PCR, Nasal     Status: None   Collection Time: 04/27/24  3:33 PM   Specimen: Nasal Mucosa; Nasal Swab  Result Value Ref Range Status   MRSA by PCR Next Gen NOT DETECTED NOT DETECTED Final    Comment: (NOTE) The GeneXpert MRSA Assay (FDA approved for NASAL specimens only), is one component of a comprehensive MRSA colonization surveillance program. It is not intended to diagnose MRSA infection nor to guide or monitor treatment for MRSA infections. Test performance is not FDA approved in patients less than 16 years old. Performed at [facility], [address] [place], [place] [address]   Respiratory (~20 pathogens) panel by PCR      Status: None   Collection Time: 04/27/24  5:13 PM   Specimen: Nasopharyngeal Swab; Respiratory  Result Value Ref Range Status   Adenovirus NOT DETECTED NOT DETECTED Final   Coronavirus 229E NOT DETECTED NOT DETECTED Final    Comment: (NOTE) The Coronavirus on the Respiratory Panel, DOES NOT test for the novel  Coronavirus (2019 nCoV)    Coronavirus HKU1 NOT DETECTED NOT DETECTED Final   Coronavirus NL63 NOT DETECTED NOT DETECTED Final   Coronavirus OC43 NOT DETECTED NOT DETECTED Final   Metapneumovirus NOT DETECTED NOT DETECTED Final   Rhinovirus / Enterovirus NOT DETECTED NOT DETECTED Final   Influenza A NOT DETECTED NOT DETECTED Final   Influenza B NOT DETECTED NOT DETECTED Final   Parainfluenza Virus 1 NOT DETECTED NOT DETECTED Final   Parainfluenza Virus 2 NOT DETECTED NOT DETECTED Final   Parainfluenza Virus 3 NOT DETECTED NOT DETECTED Final   Parainfluenza Virus 4 NOT DETECTED NOT DETECTED Final   Respiratory Syncytial Virus NOT DETECTED NOT DETECTED Final   Bordetella pertussis NOT DETECTED NOT DETECTED Final   Bordetella Parapertussis NOT DETECTED NOT DETECTED Final   Chlamydophila pneumoniae NOT DETECTED NOT DETECTED Final   Mycoplasma pneumoniae NOT DETECTED NOT DETECTED Final    Comment: Performed at [facility], [address], [place], [place] [address]    Time spent: *** minutes  Signed: Elvan Sor, MD {dischdate:[id number]}

## 2024-05-14 NOTE — Assessment & Plan Note (Signed)
 Difficulty with ADL Will send for physical therapy Continue to work on strength.

## 2024-05-14 NOTE — Assessment & Plan Note (Signed)
 Concerns for toenails becoming ingrown Will send Podiatry referral Continue to monitor

## 2024-05-14 NOTE — Progress Notes (Signed)
 PT Cancellation Note  Patient Details Name: Lucas Kline MRN: [id number] DOB: 1929/03/02   Cancelled Treatment:    Reason Eval/Treat Not Completed: Other (comment) Chart reviewed. Pt recently updated to comfort care on 04/28/2024. Pt is no longer appropriate for PT services. Will DC in house. Please re-consult if needs change  Markan Cazarez, SPT  Rontae Inglett 02-May-2024, 8:45 AM

## 2024-05-14 NOTE — Progress Notes (Signed)
 Palliative Care Progress Note, Assessment & Plan   Patient Name: Lucas Kline       Date: 05-06-24 DOB: 14-Jan-1929  Age: 89 y.o. MRN#: [id number] Attending Physician: Von Bellis, MD Primary Care Physician: Milon Cleaves, [place] Admit Date: 04/27/2024  Subjective: Unable to assess  HPI: 89 y.o. male  with past medical history significant for A-fib, COPD, Alzheimer's disease. HTN, HLD, hypothyroidism and Parkinson's Disease. He was recently admitted 7/23-7/29/25 for PNA. He presented to [facility] ED in [facility] for weakness. He was found to have normal workup, diagnosed with FTT and discharged home without admission. Patient presented again to [facility] ED 04/27/2024  via EMS after being found unresponsive at home. EMS reported patient had SpO2 60% RA on scene with pinpoint pupils. He was given narcan with no response and only responded to painful stimuli.  Upon arrival to ED, patient was found to be hypoxic, obtunded and septic.   ED labs significant for Na+ 146, glucose 163, BUN 30, creatinine 1.06, albumin 3.1, AST 75, BNP 285, troponin 23, hemoglobin 12.9, lactic 4.8, procalcitonin 1.99 and INR 1.4. VBG pH 7.07/pCO2 101/pO2 30/bicarb 29.3 BP 139/104, HR 88, RR 25, SpO2 99% and rectal temp 91.6 F.   COVID/flu/RSV negative UA negative, UDS negative   CXR showed multifocal airspace disease throughout the right lung, most pronounced in right upper lung zone, may be secondary to aspiration or multifocal pneumonia. CT head showed no acute intracranial abnormality identified with mild to moderate heterogeneity in the thalami and cerebral white matter most commonly due to small vessel disease. CT C-spine showed no acute traumatic injury identified in cervical spine with mild for age cervical spine degeneration  superimposed on degenerative C5-C6 ankylosis. CTA chest showed emphysema with severe bullous disease and architectural distortion of the left lower lobe.  Isolated possible small nonocclusive PE in that lobe significance of which is doubtful with no other pulmonary artery filling defect.  Positive for right lung multi lobar pneumonia with retained secretions and opacification of right lung airways, suspicious for aspiration.  Only trace right pleural effusion. CTAP showed no acute or inflammatory process identified and mildly motion degraded abdomen and pelvis.   Patient was admitted to [facility] for management of acute hypoxic and hypercapnic respiratory failure in the setting of pneumonia: HCAP versus aspiration, severe sepsis due to pneumonia, mildly elevated troponin with suspected demand ischemia and acute metabolic encephalopathy.   Palliative care was consulted for assistance with goals of care conversations.    Summary of counseling/coordination of care: Extensive chart review completed prior to meeting patient including labs, vital signs, imaging, progress notes, orders, and available advanced directive documents from current and previous encounters.   After reviewing the patient's chart and I assessed the patient at bedside and spoke with patient's daughter Lucas Kline via phone in regards to status and continuing care.  Ill-appearing, elderly male lying in bed.  He does not respond to verbal or tactile stimuli.  Patient noted to have slow, shallow respirations with no distress.  He appears to be actively dying.  Patient's daughter Lucas Kline contacted PMT and phone call was returned.  She tearfully states that she will not be visiting today as she feels she was  able to speak to her father yesterday and has closure.  She shares it is too painful to see him in this condition.  She also advises that her brother would not be able to visit today from extreme grief and also feels he is at peace with  decision to transition to comfort care overnight.  Lucas Kline was advised she will be notified if her father were to pass today.  Lucas Kline expresses being thankful for palliative's input in the care of her father.  Therapeutic silence and active listening provided for daughter to share her thoughts and emotions regarding current medical situation.  Emotional support provided.  Physical Exam Vitals reviewed.  Constitutional:      General: He is not in acute distress.    Appearance: He is ill-appearing.     Comments: Frail, cachectic  HENT:     Head: Normocephalic and atraumatic.     Mouth/Throat:     Mouth: Mucous membranes are dry.  Pulmonary:     Comments: Small, shallow, uneven respirations Skin:    General: Skin is warm and dry.     Recommendations/Plan: DNR/DNI Anticipate hospital death  Total Time 50 minutes   Discussed plan of care with Dana, PCCM NP  Time spent includes: Detailed review of medical records (labs, imaging, vital signs), medically appropriate exam (mental status, respiratory, cardiac, skin), discussed with treatment team, counseling and educating patient, family and staff, documenting clinical information, medication management and coordination of care.     Devere Sacks, AMANDA [facility] Palliative Medicine Team  05-11-2024 8:41 AM  Office [phone]  Pager [phone]

## 2024-05-14 NOTE — Assessment & Plan Note (Signed)
 Significant weight loss noted during hospitalization. Poor appetite persists, with preference for junk food over balanced meals. - Consider medication to increase appetite. - Encourage consumption of Ensure mixed with coffee to increase caloric intake.

## 2024-05-14 DEATH — deceased

## 2024-05-15 ENCOUNTER — Ambulatory Visit: Admitting: Physical Therapy

## 2024-05-17 ENCOUNTER — Ambulatory Visit: Admitting: Physical Therapy

## 2024-05-22 ENCOUNTER — Ambulatory Visit: Admitting: Physical Therapy

## 2024-05-24 ENCOUNTER — Ambulatory Visit: Admitting: Physician Assistant

## 2024-05-24 ENCOUNTER — Ambulatory Visit: Admitting: Physical Therapy

## 2024-05-30 ENCOUNTER — Ambulatory Visit: Admitting: Physician Assistant

## 2024-07-26 ENCOUNTER — Ambulatory Visit: Admitting: Podiatry
